# Patient Record
Sex: Female | Born: 1970
Health system: Southern US, Community
[De-identification: ages and names within clinical notes are randomized; demographics above are authoritative.]

## PROBLEM LIST (undated history)

## (undated) DIAGNOSIS — F329 Major depressive disorder, single episode, unspecified: Secondary | ICD-10-CM

## (undated) DIAGNOSIS — I10 Essential (primary) hypertension: Secondary | ICD-10-CM

## (undated) DIAGNOSIS — R51 Headache: Secondary | ICD-10-CM

## (undated) DIAGNOSIS — F410 Panic disorder [episodic paroxysmal anxiety] without agoraphobia: Secondary | ICD-10-CM

## (undated) DIAGNOSIS — F32A Depression, unspecified: Secondary | ICD-10-CM

## (undated) DIAGNOSIS — M1990 Unspecified osteoarthritis, unspecified site: Secondary | ICD-10-CM

## (undated) DIAGNOSIS — R569 Unspecified convulsions: Secondary | ICD-10-CM

## (undated) DIAGNOSIS — K469 Unspecified abdominal hernia without obstruction or gangrene: Secondary | ICD-10-CM

## (undated) DIAGNOSIS — K219 Gastro-esophageal reflux disease without esophagitis: Secondary | ICD-10-CM

## (undated) DIAGNOSIS — E785 Hyperlipidemia, unspecified: Secondary | ICD-10-CM

## (undated) DIAGNOSIS — G4730 Sleep apnea, unspecified: Secondary | ICD-10-CM

## (undated) DIAGNOSIS — F419 Anxiety disorder, unspecified: Secondary | ICD-10-CM

## (undated) HISTORY — PX: ABDOMINAL HYSTERECTOMY: SHX81

## (undated) HISTORY — DX: Hyperlipidemia, unspecified: E78.5

## (undated) HISTORY — PX: CORONARY ANGIOPLASTY: SHX604

## (undated) HISTORY — DX: Headache: R51

## (undated) HISTORY — PX: APPENDECTOMY: SHX54

## (undated) HISTORY — PX: OTHER SURGICAL HISTORY: SHX169

## (undated) HISTORY — DX: Gastro-esophageal reflux disease without esophagitis: K21.9

## (undated) HISTORY — PX: MOUTH SURGERY: SHX715

---

## 2000-11-03 ENCOUNTER — Emergency Department (HOSPITAL_COMMUNITY): Admission: EM | Admit: 2000-11-03 | Discharge: 2000-11-03 | Payer: Self-pay | Admitting: *Deleted

## 2000-11-22 ENCOUNTER — Ambulatory Visit (HOSPITAL_COMMUNITY): Admission: RE | Admit: 2000-11-22 | Discharge: 2000-11-22 | Payer: Self-pay | Admitting: Obstetrics and Gynecology

## 2000-11-22 ENCOUNTER — Encounter: Payer: Self-pay | Admitting: Obstetrics and Gynecology

## 2001-05-09 ENCOUNTER — Encounter: Payer: Self-pay | Admitting: Obstetrics and Gynecology

## 2001-05-09 ENCOUNTER — Ambulatory Visit (HOSPITAL_COMMUNITY): Admission: RE | Admit: 2001-05-09 | Discharge: 2001-05-09 | Payer: Self-pay | Admitting: Obstetrics and Gynecology

## 2001-08-25 ENCOUNTER — Emergency Department (HOSPITAL_COMMUNITY): Admission: EM | Admit: 2001-08-25 | Discharge: 2001-08-25 | Payer: Self-pay | Admitting: Emergency Medicine

## 2001-09-20 ENCOUNTER — Emergency Department (HOSPITAL_COMMUNITY): Admission: EM | Admit: 2001-09-20 | Discharge: 2001-09-20 | Payer: Self-pay | Admitting: Emergency Medicine

## 2002-03-04 ENCOUNTER — Observation Stay (HOSPITAL_COMMUNITY): Admission: RE | Admit: 2002-03-04 | Discharge: 2002-03-05 | Payer: Self-pay | Admitting: Obstetrics & Gynecology

## 2004-01-07 ENCOUNTER — Emergency Department (HOSPITAL_COMMUNITY): Admission: EM | Admit: 2004-01-07 | Discharge: 2004-01-07 | Payer: Self-pay | Admitting: Emergency Medicine

## 2004-08-01 ENCOUNTER — Ambulatory Visit (HOSPITAL_COMMUNITY): Admission: RE | Admit: 2004-08-01 | Discharge: 2004-08-01 | Payer: Self-pay | Admitting: Obstetrics & Gynecology

## 2004-12-07 ENCOUNTER — Emergency Department (HOSPITAL_COMMUNITY): Admission: EM | Admit: 2004-12-07 | Discharge: 2004-12-07 | Payer: Self-pay | Admitting: Emergency Medicine

## 2005-09-09 ENCOUNTER — Inpatient Hospital Stay (HOSPITAL_COMMUNITY): Admission: EM | Admit: 2005-09-09 | Discharge: 2005-09-11 | Payer: Self-pay | Admitting: *Deleted

## 2005-09-10 ENCOUNTER — Ambulatory Visit: Payer: Self-pay | Admitting: *Deleted

## 2005-11-01 ENCOUNTER — Ambulatory Visit (HOSPITAL_COMMUNITY): Payer: Self-pay | Admitting: Psychiatry

## 2005-11-02 ENCOUNTER — Ambulatory Visit (HOSPITAL_COMMUNITY): Payer: Self-pay | Admitting: Psychiatry

## 2005-11-08 ENCOUNTER — Ambulatory Visit (HOSPITAL_COMMUNITY): Payer: Self-pay | Admitting: Psychiatry

## 2005-11-21 ENCOUNTER — Ambulatory Visit (HOSPITAL_COMMUNITY): Payer: Self-pay | Admitting: Psychiatry

## 2005-11-29 ENCOUNTER — Ambulatory Visit (HOSPITAL_COMMUNITY): Payer: Self-pay | Admitting: Psychiatry

## 2006-01-17 ENCOUNTER — Ambulatory Visit (HOSPITAL_COMMUNITY): Payer: Self-pay | Admitting: Psychiatry

## 2006-02-14 ENCOUNTER — Ambulatory Visit (HOSPITAL_COMMUNITY): Payer: Self-pay | Admitting: Psychiatry

## 2006-04-09 ENCOUNTER — Ambulatory Visit (HOSPITAL_COMMUNITY): Payer: Self-pay | Admitting: Psychiatry

## 2006-04-24 ENCOUNTER — Emergency Department (HOSPITAL_COMMUNITY): Admission: EM | Admit: 2006-04-24 | Discharge: 2006-04-24 | Payer: Self-pay | Admitting: Emergency Medicine

## 2006-08-27 ENCOUNTER — Ambulatory Visit (HOSPITAL_COMMUNITY): Payer: Self-pay | Admitting: Psychiatry

## 2006-10-23 ENCOUNTER — Ambulatory Visit (HOSPITAL_COMMUNITY): Payer: Self-pay | Admitting: Psychiatry

## 2006-11-05 ENCOUNTER — Ambulatory Visit (HOSPITAL_COMMUNITY): Payer: Self-pay | Admitting: Psychiatry

## 2006-11-26 ENCOUNTER — Ambulatory Visit (HOSPITAL_COMMUNITY): Payer: Self-pay | Admitting: Psychiatry

## 2006-12-05 ENCOUNTER — Ambulatory Visit (HOSPITAL_COMMUNITY): Payer: Self-pay | Admitting: Psychiatry

## 2007-02-04 ENCOUNTER — Ambulatory Visit (HOSPITAL_COMMUNITY): Payer: Self-pay | Admitting: Psychiatry

## 2007-04-08 ENCOUNTER — Ambulatory Visit (HOSPITAL_COMMUNITY): Payer: Self-pay | Admitting: Psychiatry

## 2007-07-17 ENCOUNTER — Ambulatory Visit (HOSPITAL_COMMUNITY): Payer: Self-pay | Admitting: Psychiatry

## 2007-10-27 ENCOUNTER — Ambulatory Visit (HOSPITAL_COMMUNITY): Admission: RE | Admit: 2007-10-27 | Discharge: 2007-10-27 | Payer: Self-pay | Admitting: Internal Medicine

## 2007-11-06 ENCOUNTER — Ambulatory Visit (HOSPITAL_COMMUNITY): Payer: Self-pay | Admitting: Psychiatry

## 2007-12-19 ENCOUNTER — Ambulatory Visit (HOSPITAL_COMMUNITY): Admission: RE | Admit: 2007-12-19 | Discharge: 2007-12-19 | Payer: Self-pay | Admitting: General Surgery

## 2007-12-19 ENCOUNTER — Encounter (INDEPENDENT_AMBULATORY_CARE_PROVIDER_SITE_OTHER): Payer: Self-pay | Admitting: General Surgery

## 2008-06-17 ENCOUNTER — Emergency Department (HOSPITAL_COMMUNITY): Admission: EM | Admit: 2008-06-17 | Discharge: 2008-06-17 | Payer: Self-pay | Admitting: Emergency Medicine

## 2009-02-02 ENCOUNTER — Emergency Department (HOSPITAL_COMMUNITY): Admission: EM | Admit: 2009-02-02 | Discharge: 2009-02-02 | Payer: Self-pay | Admitting: Emergency Medicine

## 2009-10-12 ENCOUNTER — Emergency Department (HOSPITAL_COMMUNITY): Admission: EM | Admit: 2009-10-12 | Discharge: 2009-10-12 | Payer: Self-pay | Admitting: Emergency Medicine

## 2009-10-17 ENCOUNTER — Ambulatory Visit (HOSPITAL_COMMUNITY): Admission: RE | Admit: 2009-10-17 | Discharge: 2009-10-17 | Payer: Self-pay | Admitting: Urology

## 2009-11-01 ENCOUNTER — Ambulatory Visit (HOSPITAL_COMMUNITY): Admission: RE | Admit: 2009-11-01 | Discharge: 2009-11-01 | Payer: Self-pay | Admitting: Urology

## 2009-11-04 ENCOUNTER — Ambulatory Visit (HOSPITAL_COMMUNITY): Admission: RE | Admit: 2009-11-04 | Discharge: 2009-11-04 | Payer: Self-pay | Admitting: Urology

## 2009-11-04 ENCOUNTER — Emergency Department (HOSPITAL_COMMUNITY): Admission: EM | Admit: 2009-11-04 | Discharge: 2009-11-04 | Payer: Self-pay | Admitting: Emergency Medicine

## 2010-04-02 ENCOUNTER — Emergency Department (HOSPITAL_COMMUNITY)
Admission: EM | Admit: 2010-04-02 | Discharge: 2010-04-02 | Payer: Self-pay | Source: Home / Self Care | Admitting: Family Medicine

## 2010-05-14 ENCOUNTER — Encounter: Payer: Self-pay | Admitting: Family Medicine

## 2010-07-04 LAB — POCT URINALYSIS DIPSTICK
Bilirubin Urine: NEGATIVE
Nitrite: NEGATIVE
Urobilinogen, UA: 0.2 mg/dL (ref 0.0–1.0)

## 2010-07-09 LAB — CBC
HCT: 40.2 % (ref 36.0–46.0)
MCH: 32.1 pg (ref 26.0–34.0)
MCHC: 34.6 g/dL (ref 30.0–36.0)
MCV: 92.7 fL (ref 78.0–100.0)
Platelets: 267 10*3/uL (ref 150–400)
RBC: 4.34 MIL/uL (ref 3.87–5.11)
WBC: 8.6 10*3/uL (ref 4.0–10.5)

## 2010-07-09 LAB — URINALYSIS, ROUTINE W REFLEX MICROSCOPIC
Glucose, UA: NEGATIVE mg/dL
Ketones, ur: NEGATIVE mg/dL
Specific Gravity, Urine: 1.01 (ref 1.005–1.030)
pH: 6 (ref 5.0–8.0)

## 2010-07-09 LAB — URINE CULTURE

## 2010-07-09 LAB — URINE MICROSCOPIC-ADD ON

## 2010-07-09 LAB — DIFFERENTIAL
Eosinophils Relative: 5 % (ref 0–5)
Neutrophils Relative %: 46 % (ref 43–77)

## 2010-07-09 LAB — SURGICAL PCR SCREEN
MRSA, PCR: NEGATIVE
Staphylococcus aureus: NEGATIVE

## 2010-07-09 LAB — COMPREHENSIVE METABOLIC PANEL
AST: 17 U/L (ref 0–37)
Albumin: 4.4 g/dL (ref 3.5–5.2)
Alkaline Phosphatase: 69 U/L (ref 39–117)
Creatinine, Ser: 0.71 mg/dL (ref 0.4–1.2)
GFR calc Af Amer: >60 mL/min (ref >60)
Total Bilirubin: 0.4 mg/dL (ref 0.3–1.2)

## 2010-07-27 LAB — URINE CULTURE: Colony Count: NO GROWTH

## 2010-07-27 LAB — BASIC METABOLIC PANEL
BUN: 8 mg/dL (ref 6–23)
CO2: 25 mEq/L (ref 19–32)
Calcium: 8.9 mg/dL (ref 8.4–10.5)
Chloride: 107 mEq/L (ref 96–112)
Creatinine, Ser: 0.62 mg/dL (ref 0.4–1.2)
GFR calc Af Amer: >60 mL/min (ref >60)

## 2010-07-27 LAB — URINALYSIS, ROUTINE W REFLEX MICROSCOPIC
Bilirubin Urine: NEGATIVE
Glucose, UA: NEGATIVE mg/dL
Hgb urine dipstick: NEGATIVE
Urobilinogen, UA: 0.2 mg/dL (ref 0.0–1.0)

## 2010-07-27 LAB — DIFFERENTIAL
Basophils Absolute: 0 10*3/uL (ref 0.0–0.1)
Basophils Relative: 0 % (ref 0–1)
Eosinophils Absolute: 0.4 10*3/uL (ref 0.0–0.7)
Monocytes Relative: 7 % (ref 3–12)
Neutro Abs: 5.4 10*3/uL (ref 1.7–7.7)
Neutrophils Relative %: 63 % (ref 43–77)

## 2010-07-27 LAB — POCT CARDIAC MARKERS
CKMB, poc: <1 ng/mL — ABNORMAL LOW (ref 1.0–8.0)
Myoglobin, poc: 79.3 ng/mL (ref 12–200)

## 2010-07-27 LAB — CBC
MCHC: 34.9 g/dL (ref 30.0–36.0)
MCV: 93.2 fL (ref 78.0–100.0)
Platelets: 211 10*3/uL (ref 150–400)
RBC: 4.25 MIL/uL (ref 3.87–5.11)

## 2010-07-27 LAB — URINE MICROSCOPIC-ADD ON

## 2010-07-27 LAB — PREGNANCY, URINE: Preg Test, Ur: NEGATIVE

## 2010-08-08 LAB — URINALYSIS, ROUTINE W REFLEX MICROSCOPIC
Bilirubin Urine: NEGATIVE
Glucose, UA: NEGATIVE mg/dL
Nitrite: NEGATIVE
Specific Gravity, Urine: 1.015 (ref 1.005–1.030)
pH: 6 (ref 5.0–8.0)

## 2010-08-08 LAB — COMPREHENSIVE METABOLIC PANEL
ALT: 33 U/L (ref 0–35)
AST: 19 U/L (ref 0–37)
CO2: 25 mEq/L (ref 19–32)
Calcium: 9.2 mg/dL (ref 8.4–10.5)
Creatinine, Ser: 0.82 mg/dL (ref 0.4–1.2)
GFR calc Af Amer: >60 mL/min (ref >60)
GFR calc non Af Amer: >60 mL/min (ref >60)
Sodium: 135 mEq/L (ref 135–145)
Total Protein: 7.1 g/dL (ref 6.0–8.3)

## 2010-08-08 LAB — DIFFERENTIAL
Eosinophils Relative: 4 % (ref 0–5)
Lymphocytes Relative: 32 % (ref 12–46)
Lymphs Abs: 3.1 10*3/uL (ref 0.7–4.0)
Monocytes Relative: 6 % (ref 3–12)

## 2010-08-08 LAB — POCT CARDIAC MARKERS
CKMB, poc: <1 ng/mL — ABNORMAL LOW (ref 1.0–8.0)
Myoglobin, poc: 38.4 ng/mL (ref 12–200)
Troponin i, poc: <0.05 ng/mL (ref 0.00–0.09)

## 2010-08-08 LAB — CBC
MCHC: 34.5 g/dL (ref 30.0–36.0)
MCV: 91.8 fL (ref 78.0–100.0)
Platelets: 319 10*3/uL (ref 150–400)
RDW: 13.8 % (ref 11.5–15.5)

## 2010-08-08 LAB — LIPASE, BLOOD: Lipase: 27 U/L (ref 11–59)

## 2010-09-05 NOTE — H&P (Signed)
Hailey Owen, Hailey Owen                 ACCOUNT NO.:  [id number]   MEDICAL RECORD NO.:  [id number]          PATIENT TYPE:  AMB   LOCATION:  DAY                           FACILITY:  [facility]   PHYSICIAN:  Tilford Pillar, MD      DATE OF BIRTH:  May 31, 1970   DATE OF ADMISSION:  DATE OF DISCHARGE:  LH                              HISTORY & PHYSICAL   CHIEF COMPLAINT:  Right axillary cyst/boil.   HISTORY OF PRESENT ILLNESS:  The patient is a 40 year old female who  presented to my office with approximately 2-3 months history of right  axillary nodule.  This had several intermittent episodes of swelling  with associated pain and spontaneous drainage, has been treated with  antibiotics.  No significant change.  She did have a similar lesion in  the right hip in the past, which required excision for removal.  She has  had no recent fever or chills.  No recent erythema.  No recent episode.   PAST MEDICAL HISTORY:  1. Anxiety.  2. Hypertension.   PAST SURGICAL HISTORY:  1. Hysterectomy.  2. Incision and drainage of right hip abscess/cyst.   MEDICATIONS:  Ativan, Robaxin, and Topamax.   ALLERGIES:  SEPTRA, the patient states shuts down her immune system.   SOCIAL HISTORY:  One pack per day tobacco smoker.  Alcohol occasional.  No recreational drug use.  Occupation is in [employer].   PERTINENT FAMILY HISTORY:  1. Coronary artery disease.  2. Hypertension.  3. Diabetes mellitus.   REVIEW OF SYSTEMS:  Relatively unremarkable on all systems.  CONSTITUTIONAL:  Unremarkable.  EYES:  Unremarkable.  EARS, NOSE, and  THROAT:  Unremarkable.  RESPIRATORY:  Unremarkable.  CARDIOVASCULAR:  Unremarkable.  GASTROINTESTINAL:  Unremarkable.  GENITOURINARY:  Unremarkable.  MUSCULOSKELETAL:  Unremarkable.  SKIN:  As per HPI,  otherwise unremarkable.  ENDOCRINE:  Unremarkable.  NEURO:  Unremarkable.   PHYSICAL EXAMINATION:  GENERAL:  On physical exam, in general the  patient is an obese,  calm-appearing female in no acute distress.  She is  alert and oriented x3.  HEENT:  Scalp no deformities, no masses.  Eyes:  Pupils equal, round,  and reactive.  Extraocular movements are intact.  No conjunctival pallor  is noted.  Oral mucosa pink.  Normal occlusion.  NECK:  Trachea is midline.  No cervical lymphadenopathy.  PULMONARY:  Unlabored respirations.  She is clear to auscultation  bilaterally.  CARDIOVASCULAR:  Regular rate and rhythm, 2+ radial pulses and dorsalis  pedis pulses bilaterally.  Assessment of the chest, she does have  axillary nodule in the right axilla, this is approximately 1 cm with  mobile, nontender.  No erythema, no fluctuance, no drainage, no  discharge.  SKIN:  Otherwise warm and dry.   ASSESSMENT/PLAN:  Sebaceous cyst of the right axilla.  At this point, I  did have a discussion with the patient risks, benefits, and alternatives  of cyst excision versus continued conservative management.  At this  point, the patient does wish to proceed with excision of the cyst.  The  risk including, but not  limited to the risk of bleeding, infection, and  recurrence were discussed with the patient.  The patient's questions and  concerns were addressed and the patient will be consented for the  planned procedure, which will be scheduled at the patient's earliest  convenience.      Tilford Pillar, MD  Electronically Signed     BZ/MEDQ  D:  12/16/2007  T:  12/17/2007  Job:  [id number]   cc:   Edsel Petrin, D.O.  Fax: [phone]   [facility]

## 2010-09-05 NOTE — Op Note (Signed)
Hailey Owen, Hailey Owen                 ACCOUNT NO.:  [id number]   MEDICAL RECORD NO.:  [id number]          PATIENT TYPE:  AMB   LOCATION:  DAY                           FACILITY:  [facility]   PHYSICIAN:  Tilford Pillar, MD      DATE OF BIRTH:  [birth year]   DATE OF PROCEDURE:  12/19/2007  DATE OF DISCHARGE:                               OPERATIVE REPORT   PREOPERATIVE DIAGNOSIS:  Right axillary sebaceous cyst.   POSTOPERATIVE DIAGNOSIS:  Right axillary sebaceous cyst.   PROCEDURE:  Excision of an axillary sebaceous cyst via 1.5-cm incision.   SURGEON:  Tilford Pillar, MD.   ANESTHESIA:  General endotracheal local anesthetic 0.5% Sensorcaine  plain.   SPECIMEN:  Sebaceous cyst.   ESTIMATED BLOOD LOSS:  Minimal.   INDICATIONS:  The patient is a 40 year old female who presented to my  office with a history of a nodule on her right axilla.  This has been  treated on several occasions with antibiotics with no resolution.  It  had increased in size and diminished over several different episodes.  On evaluation, it was clear that she had a cyst at the length of right  axilla.  The risks, benefits, and alternatives of which were discussed  at length with the patient including but not limited to risk of  bleeding, infection, and recurrence.  The patient's questions and  concerns were addressed, and the patient was consented for the planned  procedure.   OPERATION:  The patient was taken to the operating room and was placed  in the supine position on the operating table, at which time the general  anesthetic was administered.  When the patient was asleep, she was  endotracheally intubated by anesthesia.  Her right axilla was then  prepped with DuraPrep solution.  All dressings were placed in standard  fashion.  Local anesthetic was instilled and elliptical incision was  created around the cyst.  Continued dissection  down through the  subcuticular tissue was carried out using electrocautery.   The cyst was  removed in total.  It was placed on the back table and submitted as  permanent specimen to pathology.  Hemostasis was obtained using  electrocautery.  The wound was irrigated with sterile saline and then a  3-0 Vicryl was utilized to reapproximate the deep fifth subcuticular  tissue and a 4-0 Monocryl was utilized to reapproximate the skin edges  in a running subcuticular suture.  The skin was washed and dried with  moist dry towel.  Benzoin was applied around  incision.  Half-inch Steri-Strips were placed.  The patient was allowed  to come out of general anesthetic.  The patient was transferred to the  Postanesthetic Care Unit in stable condition.  At the conclusion of the  procedure, all instrument, sponge, and needle counts were correct.  The  patient tolerated the procedure well.      Tilford Pillar, MD  Electronically Signed     BZ/MEDQ  D:  12/19/2007  T:  12/20/2007  Job:  [id number]   cc:   Corrie Mckusick, M.D.  Fax:  [phone] 

## 2010-09-08 NOTE — Op Note (Signed)
NAME:  Hailey Owen, Hailey Owen                           ACCOUNT NO.:  [id number]   MEDICAL RECORD NO.:  [id number]                   PATIENT TYPE:  AMB   LOCATION:  DAY                                  FACILITY:  [facility]   PHYSICIAN:  Lazaro Arms, M.D.                DATE OF BIRTH:  1971-04-01   DATE OF PROCEDURE:  03/04/2002  DATE OF DISCHARGE:                                 OPERATIVE REPORT   PREOPERATIVE DIAGNOSES:  1. Right lower quadrant pain.  2. History of endometriosis.  3. Dyspareunia.   POSTOPERATIVE DIAGNOSES:  1. Right lower quadrant pain.  2. History of endometriosis.  3. Dyspareunia.  4. Severe pelvic abdominal adhesive disease.  5. Densely adherent ovaries bilaterally to pelvic sidewalls. Left ovary     adherent to the sigmoid colon.   PROCEDURE:  1. Operative laparoscopy with bilateral salpingo-oophorectomy.  2. Operative laparoscopy with extensive lysis of adhesions.   SURGEON:  Lazaro Arms, M.D.   ANESTHESIA:  General endotracheal.   FINDINGS:  The patient's omentum was densely adherent to the anterior  abdominal wall. It was not just adherent in one line, it was basically  draped across the anterior abdominal wall on both the left and right pelvis.  It divided the abdominal cavity into basically three vertical sections. Her  small bowel was also adherent in the omentum. Her sigmoid colon was densely  adherent to the left ovary and tube. The right ovary and tube was densely  adherent to the pelvic sidewall.   DESCRIPTION OF PROCEDURE:  The patient was taken to the operating room,  placed in the supine position where she underwent general endotracheal  anesthesia. She was placed in dorsal lithotomy position where she was  prepped and draped in the usual sterile fashion. A Foley catheter was  placed. An incision was made in the umbilicus and a 10/11 nonbladed  obturator was placed under direct visualization into the peritoneal cavity  without difficulty. The  peritoneal cavity was insufflated and viewed with  the video laparoscope. The above noted adhesions were found and they went  all the way up to the umbilicus. The only place I could get a trocar was in  the right lower quadrant and I placed a 5 mm trocar there, I placed a 22  gauge spinal needle first and there was no bleeding. I then made an incision  and placed a bladed 5 mm trocar under direct visualization without  difficulty. I then used the harmonic scalpel and pain stakingly took down  the adhesions of the omentum and areas of small bowel as well close to the  right ovary. I took them down from the abdominal wall using the harmonic  scalpel. I could not get any other trocar in so I could not provide any  counter traction so this was very tedious and slow. I was very careful not  to  injure the area of small bowel that was adherent to the omentum or the  right pelvic sidewall. I was then able to find the right ovary and tube and  used a 22 gauge spinal needle and placed it a fingerbreadth above the pubis  and was then able to put a 5 mm trocar there as well under direct  visualization. I then grasped the ovary and tube and used the harmonic  scalpel from the right lower quadrant to take down the infundibulopelvic  ligament and dissect it off the sidewall. This was done with a great deal of  medial tension in order to prevent any sidewall  damage. I then dropped the  ovary in the cul-de-sac and tube. I then had to do the same thing on the  left. I placed a 5 mm trocar in the left lower lobe and I first placed a  spinal needle, got no bleeding and then made an incision and placed a 5 mm  trocar oblated without difficulty. I used the harmonic scalpel and pain  stakingly took down omental adhesions again all the way up to the umbilicus.  She then had omentum and sigmoid colon adherent to the pelvic sidewall on  the left and it was sitting on top of the left ovary and tube. I had to   dissect the adhesions down before I could elevate the ovary and tube and get  to the blood supply. She also had adhesions of the omentum down to the  pelvis, to the bladder and the left pelvic sidewall and these were taken  down, again careful to avoid any injury to the bowel. The harmonic scalpel  was used and moved very quickly on full power to avoid any thermal damage. I  then was able to grasp the ovary and tube and excise them using the harmonic  scalpel again with good hemostasis. I tried to remove the ovaries and tubes  through the EndoCatch bag but was unable to so I used a morcellator. I put  the morcellator in the left lower quadrant and removed the ovaries and tubes  in that manner without difficulty. All pedicles were hemostatic, all  adhesions had been taken down, the pelvis was irrigated vigorously. The  trocars were then removed under direct visualization and there was no  bleeding and the gas was allowed to escape from the abdomen. The left lower  quadrant fascia was then closed with two #0 Vicryl sutures with good repair  of defect. The umbilical incision was then closed with a single #0 Vicryl  suture with good closure of defect. All skin was closed using skin staples.  All incision sites were injected with 0.5% Marcaine plain for local  anesthetic. The patient was awakened from anesthesia, taken to the recovery  room where she experienced 50-100 cc of blood loss and was taken to the  recovery room in good stable condition.                                               Lazaro Arms, M.D.    Loraine Maple  D:  03/04/2002  T:  03/04/2002  Job:  [id number]

## 2010-09-08 NOTE — H&P (Signed)
Hailey Owen, Hailey Owen                 ACCOUNT NO.:  [id number]   MEDICAL RECORD NO.:  [id number]          PATIENT TYPE:  IPS   LOCATION:  0508                          FACILITY:  [facility]   PHYSICIAN:  Jasmine Pang, M.D. DATE OF BIRTH:  09-Apr-1971   DATE OF ADMISSION:  09/09/2005  DATE OF DISCHARGE:                         PSYCHIATRIC ADMISSION ASSESSMENT   IDENTIFYING INFORMATION:  The patient is a 40 year old separated white  female.  Apparently, she is depressed with suicidal ideation and a plan to  drive her car off the road.  She experienced an anxiety attack earlier in  the day.  Apparently, she has been separated for several weeks now from her  husband.  She lost her employment because she was working for him.  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  She does have employment coming up Sep 18, 2005.  She is  to be oriented to the dietary department at [facility].  She  stated that, Friday night, she had indicated to her husband that she really  does want to divorce him, she really does want to completely be separate and  he kept calling her back on her cell phone as she was going to her  girlfriend's house.  As she was driving to her girlfriend's house, she had  this idea that the only way to solve all of this conflict was to drive her  car off the road.  She got to the girlfriend's house, spent the night.  The  next morning, her husband came over to the girlfriend's house, talked [place]  [place] and the girlfriend followed them [place] to the lake and  she went over to visit with the girlfriend, started crying uncontrollably  and went to [facility].  During the time that she was crying, her  chest began to hurt.  Her girlfriend gave her one of her nerve pills which  is why her urine drug screen was positive for benzodiazepines, although she  is in fact prescribed Ativan herself.  In the  emergency department, her UDS  was positive for benzodiazepines.  No alcohol on board and the other labs  were within normal limits.   PAST PSYCHIATRIC HISTORY:  She has no inpatient history.  She has received  antidepressants for the past 2-3 years from Dr. __________, her primary care  Aysel Gilchrest.   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  She has been  married once.  She has two stepdaughters, ages 26 and 47.  She is not  employed at present because she had been working for her husband although  she is due to start a job on Sep 18, 2005.   FAMILY HISTORY:  She states she has one sister who has bad depression and  takes multiple medications.  Her father was an alcoholic.  He died in  04-05-05.   ALCOHOL/DRUG HISTORY:  The patient smokes a pack a day.   MEDICATIONS:  Currently prescribed medications include Wellbutrin 75 mg p.o.  q.d., Prozac 40 mg p.o. q.d., Ativan 0.5 mg, 1 p.r.n., Premarin 1.25 mg p.o.  q.d.  She might have been prescribed Chantix to help her stop smoking but it  is not clear.   ALLERGIES:  MORPHINE, DARVOCET.   PHYSICAL EXAMINATION:  This is a well-developed, obese white female in no  acute distress at this time.  Her vital signs, on admission, shows she is 67  inches tall, she weighs 220 pounds.  Her temperature is 97.5, blood pressure  159/80 to 145/86, pulse 70-72 and respirations are 16.  She does have a  history for developing boils in her pubic area that is treated with  clindamycin p.r.n.  She also gives a history for having genital herpes  because her husband was cheating on her.  Other than that, her physical  examination is unremarkable and is well-documented from the ER visit at  [facility].   MENTAL STATUS EXAM:  She is alert and oriented x3.  She is casually groomed,  dressed and adequately nourished.  Her speech is not pressured.  Her mood is  depressed and anxious.  Her affect congruent.  Her thought processes are   clear, organized and goal-oriented.  She wants to totally leave her husband  and have him leave her alone.  Judgment and insight are intact.  Concentration and memory are intact.  Intelligence is average.  She is not  actively suicidal.  However, she does not see an end to her situation.  She  wants help with that and she has never had auditory or visual  hallucinations.  She is not homicidal.   DIAGNOSES:  AXIS I:  Depressive disorder not otherwise specified,  situationally-induced.  AXIS II:  Deferred.  AXIS III:  Obesity, status post hysterectomy for endometriosis.  AXIS IV:  Separating and divorcing, problems with primary support group,  occupational issues, economic issues.  AXIS V:  30.   PLAN:  To admit for stabilization, to adjust her medications as indicated  and to have a family session with her husband to help her finalize the  separation and set boundaries.      Mickie Leonarda Salon, P.A.-C.      Jasmine Pang, M.D.  Electronically Signed    MD/MEDQ  D:  09/09/2005  T:  09/09/2005  Job:  [id number]
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
## 2010-09-30 ENCOUNTER — Emergency Department (HOSPITAL_COMMUNITY)
Admission: EM | Admit: 2010-09-30 | Discharge: 2010-09-30 | Disposition: A | Discharge status: Home / Self Care | Payer: 59 | Attending: Emergency Medicine | Admitting: Emergency Medicine

## 2010-09-30 DIAGNOSIS — R51 Headache: Secondary | ICD-10-CM | POA: Insufficient documentation

## 2010-09-30 DIAGNOSIS — K219 Gastro-esophageal reflux disease without esophagitis: Secondary | ICD-10-CM | POA: Insufficient documentation

## 2010-09-30 DIAGNOSIS — Z87442 Personal history of urinary calculi: Secondary | ICD-10-CM | POA: Insufficient documentation

## 2010-10-01 LAB — URINALYSIS, ROUTINE W REFLEX MICROSCOPIC
Glucose, UA: NEGATIVE mg/dL
Hgb urine dipstick: NEGATIVE
Ketones, ur: NEGATIVE mg/dL
Protein, ur: NEGATIVE mg/dL

## 2010-10-01 LAB — COMPREHENSIVE METABOLIC PANEL
AST: 13 U/L (ref 0–37)
CO2: 26 mEq/L (ref 19–32)
Calcium: 9.5 mg/dL (ref 8.4–10.5)
Creatinine, Ser: 0.68 mg/dL (ref 0.4–1.2)
GFR calc non Af Amer: >60 mL/min (ref >60)

## 2010-10-01 LAB — CBC
MCH: 31.1 pg (ref 26.0–34.0)
MCHC: 34.2 g/dL (ref 30.0–36.0)
MCV: 91 fL (ref 78.0–100.0)
Platelets: 266 10*3/uL (ref 150–400)
RDW: 13.5 % (ref 11.5–15.5)

## 2010-10-01 LAB — DIFFERENTIAL
Eosinophils Absolute: 0.4 10*3/uL (ref 0.0–0.7)
Eosinophils Relative: 6 % — ABNORMAL HIGH (ref 0–5)
Lymphs Abs: 2.8 10*3/uL (ref 0.7–4.0)
Monocytes Absolute: 0.5 10*3/uL (ref 0.1–1.0)
Monocytes Relative: 7 % (ref 3–12)

## 2010-11-24 ENCOUNTER — Other Ambulatory Visit (HOSPITAL_COMMUNITY): Payer: Self-pay | Admitting: Internal Medicine

## 2010-11-24 DIAGNOSIS — Z139 Encounter for screening, unspecified: Secondary | ICD-10-CM

## 2010-11-27 ENCOUNTER — Ambulatory Visit (HOSPITAL_COMMUNITY): Payer: 59

## 2011-04-15 ENCOUNTER — Emergency Department (HOSPITAL_COMMUNITY)
Admission: EM | Admit: 2011-04-15 | Discharge: 2011-04-15 | Disposition: A | Discharge status: Home / Self Care | Payer: 59 | Source: Home / Self Care | Attending: Family Medicine | Admitting: Family Medicine

## 2011-04-15 DIAGNOSIS — K219 Gastro-esophageal reflux disease without esophagitis: Secondary | ICD-10-CM

## 2011-04-15 HISTORY — DX: Anxiety disorder, unspecified: F41.9

## 2011-04-15 MED ORDER — PANTOPRAZOLE SODIUM 20 MG PO TBEC: 40.0000 mg | DELAYED_RELEASE_TABLET | Freq: Every day | ORAL | Status: DC

## 2011-04-15 MED ORDER — GI COCKTAIL ~~LOC~~
ORAL | Status: AC
  Filled 2011-04-15: qty 30

## 2011-04-15 MED ORDER — ONDANSETRON HCL 4 MG PO TABS: 4.0000 mg | ORAL_TABLET | Freq: Four times a day (QID) | ORAL | Status: AC

## 2011-04-15 MED ORDER — ONDANSETRON 4 MG PO TBDP
4.0000 mg | ORAL_TABLET | Freq: Once | ORAL | Status: AC
  Administered 2011-04-15: 4 mg via ORAL

## 2011-04-15 MED ORDER — GI COCKTAIL ~~LOC~~
30.0000 mL | Freq: Once | ORAL | Status: AC
  Administered 2011-04-15: 30 mL via ORAL

## 2011-04-15 MED ORDER — ONDANSETRON 4 MG PO TBDP
ORAL_TABLET | ORAL | Status: AC
  Filled 2011-04-15: qty 1

## 2011-04-15 NOTE — ED Notes (Signed)
Pt has nausea, abdominal cramping and burning and no appetite for three weeks.  She has hx of acid reflux, but never this severe.

## 2011-04-15 NOTE — ED Provider Notes (Addendum)
History     CSN: [id number]  Arrival date & time 04/15/11  1212   First MD Initiated Contact with Patient 04/15/11 1251      Chief Complaint  Patient presents with  . GI Problem    (Consider location/radiation/quality/duration/timing/severity/associated sxs/prior treatment) Patient is a 40 y.o. female presenting with abdominal pain. The history is provided by the patient.  Abdominal Pain The primary symptoms of the illness include abdominal pain, nausea and vomiting. The current episode started more than 2 days ago (issues for 3 weeks). The onset of the illness was gradual. The problem has not changed since onset. Associated with: smoker, sx worse with eating, works 2 jobs. The patient states that she believes she is currently not pregnant. The patient has not had a change in bowel habit. Additional symptoms associated with the illness include heartburn. Symptoms associated with the illness do not include chills, anorexia, constipation or back pain. Significant associated medical issues include GERD.    No past medical history on file.  No past surgical history on file.  No family history on file.  History  Substance Use Topics  . Smoking status: Not on file  . Smokeless tobacco: Not on file  . Alcohol Use: Not on file    OB History    No data available      Review of Systems  Constitutional: Negative.  Negative for chills.  HENT: Negative.   Respiratory: Negative.   Cardiovascular: Negative.   Gastrointestinal: Positive for heartburn, nausea, vomiting and abdominal pain. Negative for constipation, abdominal distention and anorexia.  Genitourinary: Negative.   Musculoskeletal: Negative for back pain.    Allergies  Review of patient's allergies indicates not on file.  Home Medications   Current Outpatient Rx  Name Route Sig Dispense Refill  . PANTOPRAZOLE SODIUM 20 MG PO TBEC Oral Take 2 tablets (40 mg total) by mouth daily. 60 tablet 1    BP 132/58  Pulse  71  Temp(Src) 98.1 F (36.7 C) (Oral)  Resp 16  SpO2 100%  Physical Exam  Nursing note and vitals reviewed. Constitutional: She appears well-developed and well-nourished.  HENT:  Head: Normocephalic.  Mouth/Throat: Oropharynx is clear and moist.  Neck: Normal range of motion. Neck supple.  Abdominal: Soft. Normal appearance and bowel sounds are normal. She exhibits no distension, no abdominal bruit, no pulsatile midline mass and no mass. There is no hepatosplenomegaly. There is tenderness in the epigastric area. There is no rigidity, no rebound, no guarding and no CVA tenderness. No hernia.  Lymphadenopathy:    She has no cervical adenopathy.    ED Course  Procedures (including critical care time)  Labs Reviewed - No data to display No results found.   1. GERD (gastroesophageal reflux disease)       MDM          Barkley Bruns, MD 04/15/11 1338  Barkley Bruns, MD 04/15/11 [phone]

## 2011-04-29 ENCOUNTER — Encounter (HOSPITAL_COMMUNITY): Payer: Self-pay | Admitting: Emergency Medicine

## 2011-04-29 ENCOUNTER — Emergency Department (HOSPITAL_COMMUNITY)
Admission: EM | Admit: 2011-04-29 | Discharge: 2011-04-29 | Disposition: A | Discharge status: Home / Self Care | Payer: 59 | Source: Home / Self Care | Attending: Emergency Medicine | Admitting: Emergency Medicine

## 2011-04-29 DIAGNOSIS — H6691 Otitis media, unspecified, right ear: Secondary | ICD-10-CM

## 2011-04-29 DIAGNOSIS — H6690 Otitis media, unspecified, unspecified ear: Secondary | ICD-10-CM

## 2011-04-29 HISTORY — DX: Unspecified abdominal hernia without obstruction or gangrene: K46.9

## 2011-04-29 MED ORDER — TRAMADOL HCL 50 MG PO TABS: 100.0000 mg | ORAL_TABLET | Freq: Three times a day (TID) | ORAL | Status: AC | PRN

## 2011-04-29 MED ORDER — AMOXICILLIN-POT CLAVULANATE 875-125 MG PO TABS: 1.0000 | ORAL_TABLET | Freq: Two times a day (BID) | ORAL | Status: AC

## 2011-04-29 NOTE — ED Provider Notes (Signed)
Chief Complaint  Patient presents with  . Otalgia  . 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 She has been exposed to strep.  Review of Systems:  Other than noted above, the patient denies any of the following symptoms. Systemic:  No fever, chills, sweats, fatigue, myalgias, headache, or anorexia. Eye:  No redness, pain or drainage. ENT:  No earache, nasal congestion, rhinorrhea, sinus pressure, or sore throat. Lungs:  No cough, sputum production, wheezing, shortness of breath. Or chest pain. GI:  No nausea, vomiting, abdominal pain or diarrhea. Skin:  No rash or itching.  PMFSH:  Past medical history, family history, social history, meds, and allergies were reviewed.  Physical Exam:   Vital signs:  BP 123/65  Pulse 77  Temp(Src) 98.5 F (36.9 C) (Oral)  Resp 16  SpO2 97% General:  Alert, in no distress. Eye:  No conjunctival injection or drainage. ENT: Her left TM is normal. The right TM was red, dull, and retracted.  Nasal mucosa was clear and uncongested, without drainage.  Mucous membranes were moist.  Pharynx was clear, without exudate or drainage.  There were no oral ulcerations or lesions. Neck:  Supple, no adenopathy, tenderness or mass. Lungs:  No respiratory distress.  Lungs were clear to auscultation, without wheezes, rales or rhonchi.  Breath sounds were clear and equal bilaterally. Heart:  Regular rhythm, without gallops, murmers or rubs. Skin:  Clear, warm, and dry, without rash or lesions.  Labs:   Results for orders placed during the hospital encounter of 04/29/11  POCT RAPID STREP A ([facility]RG CARE ONLY)      Component Value Range   Streptococcus, Group A Screen (Direct) NEGATIVE  NEGATIVE      Radiology:  No results found.  Medications given in UCC:   None  Assessment:   Diagnoses that have been ruled out:  Diagnoses that are still under consideration:  Final diagnoses:  Right otitis media     Plan:   1.  The following meds were prescribed:   New Prescriptions   AMOXICILLIN-CLAVULANATE (AUGMENTIN) 875-125 MG PER TABLET    Take 1 tablet by mouth 2 (two) times daily.   TRAMADOL (ULTRAM) 50 MG TABLET    Take 2 tablets (100 mg total) by mouth every 8 (eight) hours as needed for pain.   2.  The patient was instructed in symptomatic care and handouts were given. 3.  The patient was told to return if becoming worse in any way, if no better in 3 or 4 days, and given some red flag symptoms that would indicate earlier return.   Roque Lias, MD 04/29/11 [phone]

## 2011-04-29 NOTE — ED Notes (Signed)
Pt here with sore throat with radiating pain to r ear and neck that started yesterday.pain with swallowing.pt ahs hx ear infections.no fevers with sx.

## 2011-07-17 ENCOUNTER — Encounter (HOSPITAL_COMMUNITY): Payer: Self-pay | Admitting: Psychiatry

## 2011-07-17 ENCOUNTER — Ambulatory Visit (INDEPENDENT_AMBULATORY_CARE_PROVIDER_SITE_OTHER): Payer: 59 | Admitting: Psychiatry

## 2011-07-17 VITALS — Wt 207.0 lb

## 2011-07-17 DIAGNOSIS — F39 Unspecified mood [affective] disorder: Secondary | ICD-10-CM

## 2011-07-17 MED ORDER — CITALOPRAM HYDROBROMIDE 20 MG PO TABS: 20.0000 mg | ORAL_TABLET | Freq: Every day | ORAL | Status: DC

## 2011-07-17 MED ORDER — DIVALPROEX SODIUM ER 250 MG PO TB24: 250.0000 mg | ORAL_TABLET | Freq: Every day | ORAL | Status: DC
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
## 2011-08-29 ENCOUNTER — Other Ambulatory Visit (HOSPITAL_COMMUNITY): Payer: Self-pay | Admitting: Psychiatry

## 2011-08-29 DIAGNOSIS — F39 Unspecified mood [affective] disorder: Secondary | ICD-10-CM

## 2011-08-30 ENCOUNTER — Telehealth (HOSPITAL_COMMUNITY): Payer: Self-pay | Admitting: *Deleted

## 2011-09-04 ENCOUNTER — Encounter (HOSPITAL_COMMUNITY): Payer: Self-pay | Admitting: Psychiatry

## 2011-09-04 ENCOUNTER — Ambulatory Visit (INDEPENDENT_AMBULATORY_CARE_PROVIDER_SITE_OTHER): Payer: 59 | Admitting: Psychiatry

## 2011-09-04 VITALS — Wt 206.0 lb

## 2011-09-04 DIAGNOSIS — F39 Unspecified mood [affective] disorder: Secondary | ICD-10-CM

## 2011-09-04 DIAGNOSIS — F329 Major depressive disorder, single episode, unspecified: Secondary | ICD-10-CM

## 2011-09-04 DIAGNOSIS — G4700 Insomnia, unspecified: Secondary | ICD-10-CM

## 2011-09-04 MED ORDER — CITALOPRAM HYDROBROMIDE 20 MG PO TABS: 20.0000 mg | ORAL_TABLET | Freq: Every day | ORAL | Status: DC

## 2011-09-04 MED ORDER — RAMELTEON 8 MG PO TABS: 8.0000 mg | ORAL_TABLET | Freq: Every day | ORAL | Status: DC
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
## 2011-09-18 ENCOUNTER — Ambulatory Visit (HOSPITAL_COMMUNITY)
Admission: RE | Admit: 2011-09-18 | Discharge: 2011-09-18 | Disposition: A | Discharge status: Home / Self Care | Payer: 59 | Source: Ambulatory Visit | Attending: Internal Medicine | Admitting: Internal Medicine

## 2011-09-18 DIAGNOSIS — Z1231 Encounter for screening mammogram for malignant neoplasm of breast: Secondary | ICD-10-CM | POA: Insufficient documentation

## 2011-09-18 DIAGNOSIS — Z139 Encounter for screening, unspecified: Secondary | ICD-10-CM

## 2011-10-02 ENCOUNTER — Other Ambulatory Visit (HOSPITAL_COMMUNITY): Payer: Self-pay | Admitting: Internal Medicine

## 2011-10-02 DIAGNOSIS — R51 Headache: Secondary | ICD-10-CM

## 2011-10-04 ENCOUNTER — Ambulatory Visit (HOSPITAL_COMMUNITY)
Admission: RE | Admit: 2011-10-04 | Discharge: 2011-10-04 | Disposition: A | Discharge status: Home / Self Care | Payer: 59 | Source: Ambulatory Visit | Attending: Internal Medicine | Admitting: Internal Medicine

## 2011-10-04 ENCOUNTER — Ambulatory Visit (HOSPITAL_COMMUNITY): Payer: 59

## 2011-10-04 DIAGNOSIS — R51 Headache: Secondary | ICD-10-CM

## 2011-10-05 ENCOUNTER — Ambulatory Visit (HOSPITAL_COMMUNITY)
Admission: RE | Admit: 2011-10-05 | Discharge: 2011-10-05 | Disposition: A | Discharge status: Home / Self Care | Payer: 59 | Source: Ambulatory Visit | Attending: Internal Medicine | Admitting: Internal Medicine

## 2011-10-05 DIAGNOSIS — R42 Dizziness and giddiness: Secondary | ICD-10-CM | POA: Insufficient documentation

## 2011-10-05 DIAGNOSIS — R51 Headache: Secondary | ICD-10-CM | POA: Insufficient documentation

## 2011-10-16 ENCOUNTER — Ambulatory Visit (INDEPENDENT_AMBULATORY_CARE_PROVIDER_SITE_OTHER): Payer: 59 | Admitting: Psychiatry

## 2011-10-16 ENCOUNTER — Encounter (HOSPITAL_COMMUNITY): Payer: Self-pay | Admitting: Psychiatry

## 2011-10-16 VITALS — Wt 208.0 lb

## 2011-10-16 DIAGNOSIS — F329 Major depressive disorder, single episode, unspecified: Secondary | ICD-10-CM

## 2011-10-16 DIAGNOSIS — F39 Unspecified mood [affective] disorder: Secondary | ICD-10-CM

## 2011-10-16 MED ORDER — CITALOPRAM HYDROBROMIDE 20 MG PO TABS: 20.0000 mg | ORAL_TABLET | Freq: Every day | ORAL | Status: DC
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
## 2011-12-31 ENCOUNTER — Other Ambulatory Visit (HOSPITAL_COMMUNITY): Payer: Self-pay | Admitting: Psychiatry

## 2012-01-02 ENCOUNTER — Other Ambulatory Visit (HOSPITAL_COMMUNITY): Payer: Self-pay | Admitting: *Deleted

## 2012-01-02 ENCOUNTER — Other Ambulatory Visit (HOSPITAL_COMMUNITY): Payer: Self-pay | Admitting: Psychiatry

## 2012-01-02 DIAGNOSIS — F39 Unspecified mood [affective] disorder: Secondary | ICD-10-CM

## 2012-01-02 MED ORDER — CITALOPRAM HYDROBROMIDE 20 MG PO TABS: 20.0000 mg | ORAL_TABLET | Freq: Every day | ORAL | Status: DC

## 2012-01-10 ENCOUNTER — Ambulatory Visit (INDEPENDENT_AMBULATORY_CARE_PROVIDER_SITE_OTHER): Payer: 59 | Admitting: Psychiatry

## 2012-01-10 ENCOUNTER — Encounter (HOSPITAL_COMMUNITY): Payer: Self-pay | Admitting: Psychiatry

## 2012-01-10 DIAGNOSIS — F329 Major depressive disorder, single episode, unspecified: Secondary | ICD-10-CM

## 2012-01-10 DIAGNOSIS — F39 Unspecified mood [affective] disorder: Secondary | ICD-10-CM

## 2012-01-10 MED ORDER — CITALOPRAM HYDROBROMIDE 20 MG PO TABS: 20.0000 mg | ORAL_TABLET | Freq: Every day | ORAL | Status: DC
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
## 2012-02-04 ENCOUNTER — Encounter (HOSPITAL_COMMUNITY): Payer: Self-pay

## 2012-02-04 ENCOUNTER — Emergency Department (HOSPITAL_COMMUNITY)
Admission: EM | Admit: 2012-02-04 | Discharge: 2012-02-04 | Disposition: A | Discharge status: Home / Self Care | Payer: 59 | Attending: Emergency Medicine | Admitting: Emergency Medicine

## 2012-02-04 DIAGNOSIS — F419 Anxiety disorder, unspecified: Secondary | ICD-10-CM

## 2012-02-04 DIAGNOSIS — F17200 Nicotine dependence, unspecified, uncomplicated: Secondary | ICD-10-CM | POA: Insufficient documentation

## 2012-02-04 DIAGNOSIS — F329 Major depressive disorder, single episode, unspecified: Secondary | ICD-10-CM | POA: Insufficient documentation

## 2012-02-04 DIAGNOSIS — E785 Hyperlipidemia, unspecified: Secondary | ICD-10-CM | POA: Insufficient documentation

## 2012-02-04 DIAGNOSIS — F410 Panic disorder [episodic paroxysmal anxiety] without agoraphobia: Secondary | ICD-10-CM | POA: Insufficient documentation

## 2012-02-04 DIAGNOSIS — F411 Generalized anxiety disorder: Secondary | ICD-10-CM | POA: Insufficient documentation

## 2012-02-04 DIAGNOSIS — F3289 Other specified depressive episodes: Secondary | ICD-10-CM | POA: Insufficient documentation

## 2012-02-04 DIAGNOSIS — K219 Gastro-esophageal reflux disease without esophagitis: Secondary | ICD-10-CM | POA: Insufficient documentation

## 2012-02-04 DIAGNOSIS — Z79899 Other long term (current) drug therapy: Secondary | ICD-10-CM | POA: Insufficient documentation

## 2012-02-04 HISTORY — DX: Major depressive disorder, single episode, unspecified: F32.9

## 2012-02-04 HISTORY — DX: Depression, unspecified: F32.A

## 2012-02-04 HISTORY — DX: Panic disorder (episodic paroxysmal anxiety): F41.0

## 2012-02-04 LAB — RAPID URINE DRUG SCREEN, HOSP PERFORMED: Barbiturates: NOT DETECTED

## 2012-02-04 LAB — URINALYSIS, ROUTINE W REFLEX MICROSCOPIC
Bilirubin Urine: NEGATIVE
Ketones, ur: NEGATIVE mg/dL
Leukocytes, UA: NEGATIVE
Nitrite: NEGATIVE
Specific Gravity, Urine: 1.01 (ref 1.005–1.030)
Urobilinogen, UA: 0.2 mg/dL (ref 0.0–1.0)

## 2012-02-04 LAB — CBC WITH DIFFERENTIAL/PLATELET
Basophils Absolute: 0 10*3/uL (ref 0.0–0.1)
Eosinophils Absolute: 0.1 10*3/uL (ref 0.0–0.7)
Eosinophils Relative: 1 % (ref 0–5)
Lymphocytes Relative: 22 % (ref 12–46)
Lymphs Abs: 2.3 10*3/uL (ref 0.7–4.0)
MCV: 91.6 fL (ref 78.0–100.0)
Neutrophils Relative %: 71 % (ref 43–77)
Platelets: 267 10*3/uL (ref 150–400)
RBC: 5.02 MIL/uL (ref 3.87–5.11)
RDW: 13.2 % (ref 11.5–15.5)
WBC: 10.7 10*3/uL — ABNORMAL HIGH (ref 4.0–10.5)

## 2012-02-04 LAB — BASIC METABOLIC PANEL
CO2: 26 mEq/L (ref 19–32)
Calcium: 10.2 mg/dL (ref 8.4–10.5)
Creatinine, Ser: 0.69 mg/dL (ref 0.50–1.10)
GFR calc Af Amer: >90 mL/min (ref >90)
GFR calc non Af Amer: >90 mL/min (ref >90)
Sodium: 138 mEq/L (ref 135–145)

## 2012-02-04 LAB — ETHANOL: Alcohol, Ethyl (B): <11 mg/dL (ref 0–11)

## 2012-02-04 LAB — PREGNANCY, URINE: Preg Test, Ur: NEGATIVE

## 2012-02-04 NOTE — ED Provider Notes (Signed)
History     CSN: [id number]  Arrival date & time 02/04/12  4098   First MD Initiated Contact with Patient 02/04/12 1842      Chief Complaint  Patient presents with  . Panic Attack    HPI Pt was seen at 1555.  Per pt, c/o gradual onset and persistence of acute flair of chronic multiple intermittent episodes of anxiety and panic attacks since "the 1990's."  Pt states she feels the panic attacks "are coming on more frequently" for the past several months.  Pt describes her symptoms as: hyperventilating, feeling anxious, hands and feet cramping.   Pt endorses states she has been compliant with her psych meds and her Psych MD Arfeen appts. Pt states she has an appt with Dr. Lolly Mustache this week.  Denies any change in her usual symptoms.  Denies CP/, no abd pain, no back pain, no N/V/D, no fevers, no SI, no HI.    Past Medical History  Diagnosis Date  . Anxiety   . Hernia   . GERD (gastroesophageal reflux disease)   . Hyperlipemia   . Panic attack   . Depression     Past Surgical History  Procedure Date  . Abdominal hysterectomy     Family History  Problem Relation Age of Onset  . Alcohol abuse Father   . Depression Father   . Depression Sister     History  Substance Use Topics  . Smoking status: Current Every Day Smoker -- 0.5 packs/day for 20 years    Types: Cigarettes  . Smokeless tobacco: Not on file  . Alcohol Use: Yes     socially    Review of Systems ROS: Statement: All systems negative except as marked or noted in the HPI; Constitutional: Negative for fever and chills. ; ; Eyes: Negative for eye pain, redness and discharge. ; ; ENMT: Negative for ear pain, hoarseness, nasal congestion, sinus pressure and sore throat. ; ; Cardiovascular: Negative for chest pain, palpitations, diaphoresis, dyspnea and peripheral edema. ; ; Respiratory: Negative for cough, wheezing and stridor. ; ; Gastrointestinal: Negative for nausea, vomiting, diarrhea, abdominal pain, blood in stool,  hematemesis, jaundice and rectal bleeding. . ; ; Genitourinary: Negative for dysuria, flank pain and hematuria. ; ; Musculoskeletal: Negative for back pain and neck pain. Negative for swelling and trauma.; ; Skin: Negative for pruritus, rash, abrasions, blisters, bruising and skin lesion.; ; Neuro: Negative for headache, lightheadedness and neck stiffness. Negative for weakness, altered level of consciousness , altered mental status, extremity weakness, paresthesias, involuntary movement, seizure and syncope.; Psych:  +anxiety and panic attacks. No SI, no SA, no HI, no hallucinations.      Allergies  Sulfa antibiotics  Home Medications   Current Outpatient Rx  Name Route Sig Dispense Refill  . ALPRAZOLAM 0.5 MG PO TABS Oral Take 0.5 mg by mouth at bedtime as needed. For anxiety and/or sleep    . CITALOPRAM HYDROBROMIDE 20 MG PO TABS Oral Take 20 mg by mouth daily.    . COLESEVELAM HCL 625 MG PO TABS Oral Take 625 mg by mouth 4 (four) times daily.    Marland Kitchen ESTROGENS CONJUGATED 0.3 MG PO TABS Oral Take 0.3 mg by mouth daily.       BP 138/69  Pulse 81  Temp 98 F (36.7 C) (Oral)  Resp 20  Ht 5\' 6"  (1.676 m)  Wt 210 lb (95.255 kg)  BMI 33.89 kg/m2  SpO2 100%  Physical Exam 1600: Physical examination:  Nursing notes reviewed;  Vital signs and O2 SAT reviewed;  Constitutional: Well developed, Well nourished, Well hydrated, In no acute distress; Head:  Normocephalic, atraumatic; Eyes: EOMI, PERRL, No scleral icterus; ENMT: Mouth and pharynx normal, Mucous membranes moist; Neck: Supple, Full range of motion, No lymphadenopathy; Cardiovascular: Regular rate and rhythm, No murmur, rub, or gallop; Respiratory: Breath sounds clear & equal bilaterally, No rales, rhonchi, wheezes.  Speaking full sentences with ease, Normal respiratory effort/excursion; Chest: Nontender, Movement normal; Abdomen: Soft, Nontender, Nondistended, Normal bowel sounds;; Extremities: Pulses normal, No tenderness, No edema, No  calf edema or asymmetry.; Neuro: AA&Ox3, Major CN grossly intact.  Speech clear. No gross focal motor or sensory deficits in extremities.; Skin: Color normal, Warm, Dry.; Psych:  Anxious.    ED Course  Procedures  MDM  MDM Reviewed: previous chart, nursing note and vitals Interpretation: labs     Results for orders placed during the hospital encounter of 02/04/12  BASIC METABOLIC PANEL      Component Value Range   Sodium 138  135 - 145 mEq/L   Potassium 3.8  3.5 - 5.1 mEq/L   Chloride 98  96 - 112 mEq/L   CO2 26  19 - 32 mEq/L   Glucose, Bld 96  70 - 99 mg/dL   BUN 11  6 - 23 mg/dL   Creatinine, Ser 1.61  0.50 - 1.10 mg/dL   Calcium 09.6  8.4 - 04.5 mg/dL   GFR calc non Af Amer >90  >90 mL/min   GFR calc Af Amer >90  >90 mL/min  CBC WITH DIFFERENTIAL      Component Value Range   WBC 10.7 (*) 4.0 - 10.5 K/uL   RBC 5.02  3.87 - 5.11 MIL/uL   Hemoglobin 15.6 (*) 12.0 - 15.0 g/dL   HCT 40.9  81.1 - 91.4 %   MCV 91.6  78.0 - 100.0 fL   MCH 31.1  26.0 - 34.0 pg   MCHC 33.9  30.0 - 36.0 g/dL   RDW 78.2  95.6 - 21.3 %   Platelets 267  150 - 400 K/uL   Neutrophils Relative 71  43 - 77 %   Neutro Abs 7.6  1.7 - 7.7 K/uL   Lymphocytes Relative 22  12 - 46 %   Lymphs Abs 2.3  0.7 - 4.0 K/uL   Monocytes Relative 6  3 - 12 %   Monocytes Absolute 0.6  0.1 - 1.0 K/uL   Eosinophils Relative 1  0 - 5 %   Eosinophils Absolute 0.1  0.0 - 0.7 K/uL   Basophils Relative 0  0 - 1 %   Basophils Absolute 0.0  0.0 - 0.1 K/uL  PREGNANCY, URINE      Component Value Range   Preg Test, Ur NEGATIVE  NEGATIVE  URINALYSIS, ROUTINE W REFLEX MICROSCOPIC      Component Value Range   Color, Urine YELLOW  YELLOW   APPearance CLEAR  CLEAR   Specific Gravity, Urine 1.010  1.005 - 1.030   pH 5.5  5.0 - 8.0   Glucose, UA NEGATIVE  NEGATIVE mg/dL   Hgb urine dipstick NEGATIVE  NEGATIVE   Bilirubin Urine NEGATIVE  NEGATIVE   Ketones, ur NEGATIVE  NEGATIVE mg/dL   Protein, ur NEGATIVE  NEGATIVE mg/dL     Urobilinogen, UA 0.2  0.0 - 1.0 mg/dL   Nitrite NEGATIVE  NEGATIVE   Leukocytes, UA NEGATIVE  NEGATIVE  URINE RAPID DRUG SCREEN (HOSP PERFORMED)      Component Value  Range   Opiates NONE DETECTED  NONE DETECTED   Cocaine NONE DETECTED  NONE DETECTED   Benzodiazepines NONE DETECTED  NONE DETECTED   Amphetamines NONE DETECTED  NONE DETECTED   Tetrahydrocannabinol NONE DETECTED  NONE DETECTED   Barbiturates NONE DETECTED  NONE DETECTED  ETHANOL      Component Value Range   Alcohol, Ethyl (B) <11  0 - 11 mg/dL     9147:  EPIC chart reviewed: pt has been eval by Dr. Lolly Mustache monthly for the past 7 months for these same symptoms.  T/C to [facility]: pt does not meet inpt criteria.  Pt without panic attack during her ED stay. Ready to go home now. Will continue her meds and f/u with Psych MD this week as previously scheduled. Dx and testing d/w pt and family.  Questions answered.  Verb understanding, agreeable to d/c home with outpt f/u.          Laray Anger, DO 02/07/12 1142

## 2012-02-04 NOTE — ED Notes (Signed)
Pt via EMS with complaints of panic attack that started around an hour PTA. IV started in left hand by EMS

## 2012-03-11 ENCOUNTER — Ambulatory Visit (INDEPENDENT_AMBULATORY_CARE_PROVIDER_SITE_OTHER): Payer: 59 | Admitting: Psychiatry

## 2012-03-11 ENCOUNTER — Encounter (HOSPITAL_COMMUNITY): Payer: Self-pay | Admitting: Psychiatry

## 2012-03-11 VITALS — BP 132/80 | HR 64 | Ht 66.75 in | Wt 214.4 lb

## 2012-03-11 DIAGNOSIS — F418 Other specified anxiety disorders: Secondary | ICD-10-CM | POA: Insufficient documentation

## 2012-03-11 DIAGNOSIS — F5105 Insomnia due to other mental disorder: Secondary | ICD-10-CM | POA: Insufficient documentation

## 2012-03-11 DIAGNOSIS — F329 Major depressive disorder, single episode, unspecified: Secondary | ICD-10-CM

## 2012-03-11 MED ORDER — CITALOPRAM HYDROBROMIDE 20 MG PO TABS: 20.0000 mg | ORAL_TABLET | Freq: Every day | ORAL | Status: DC

## 2012-03-11 MED ORDER — GABAPENTIN 100 MG PO CAPS: ORAL_CAPSULE | ORAL | Status: DC
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
## 2012-03-11 NOTE — Patient Instructions (Signed)
Lovaza is a reasonable option in the management of high cholesterol.  Lovaza could be helpful for memory also.  Stop Xanax.  Use Neurontin for anxiety and INSOMNIA.

## 2012-04-09 ENCOUNTER — Ambulatory Visit (HOSPITAL_COMMUNITY): Payer: Self-pay | Admitting: Psychiatry

## 2013-04-13 ENCOUNTER — Encounter (HOSPITAL_COMMUNITY): Payer: Self-pay

## 2013-04-13 ENCOUNTER — Other Ambulatory Visit (HOSPITAL_COMMUNITY): Payer: 59 | Attending: Psychiatry | Admitting: Psychiatry

## 2013-04-13 DIAGNOSIS — F418 Other specified anxiety disorders: Secondary | ICD-10-CM

## 2013-04-13 DIAGNOSIS — F410 Panic disorder [episodic paroxysmal anxiety] without agoraphobia: Secondary | ICD-10-CM | POA: Insufficient documentation

## 2013-04-13 DIAGNOSIS — F341 Dysthymic disorder: Secondary | ICD-10-CM | POA: Insufficient documentation

## 2013-04-13 MED ORDER — CHLORPROMAZINE HCL 25 MG PO TABS: 25.0000 mg | ORAL_TABLET | Freq: Every day | ORAL | Status: DC
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
## 2013-04-13 NOTE — Progress Notes (Signed)
    Daily Group Progress Note  Program: IOP  Group Time: 9:00-10:30 am   Participation Level: None  Behavioral Response: none  Type of Therapy:  Initial Assessment  Summary of Progress: Pt completed the initial assessment and orientation process with case manager      Group Time: 10:30 am - 12:00 pm   Participation Level:  None  Behavioral Response: none  Type of Therapy: Medical Assessment  Summary of Progress: Pt completed the initial medical assessment by the psychiatrist   Carman Ching, LCSW

## 2013-04-14 ENCOUNTER — Other Ambulatory Visit (HOSPITAL_COMMUNITY): Payer: 59 | Admitting: Psychiatry

## 2013-04-14 DIAGNOSIS — F418 Other specified anxiety disorders: Secondary | ICD-10-CM

## 2013-04-15 ENCOUNTER — Other Ambulatory Visit (HOSPITAL_COMMUNITY): Payer: 59 | Admitting: Psychiatry

## 2013-04-15 DIAGNOSIS — F418 Other specified anxiety disorders: Secondary | ICD-10-CM

## 2013-04-15 NOTE — Progress Notes (Signed)
    Daily Group Progress Note  Program: IOP  Group Time: 9:00-10:30 am   Participation Level: Active  Behavioral Response: Appropriate  Type of Therapy:  Process Group  Summary of Progress: Pt participated in a holiday party within the group to practice having fun and healthy traditions and also talked about fears and ways to manage the Christmas holiday.        Group Time: 10:30 am - 12:00 pm   Participation Level:  Active  Behavioral Response: Appropriate  Type of Therapy: Psycho-education Group  Summary of Progress:  Pt learned about the DBT skill of Mindfulness and how to use it to manage stress and depression by being in the moment.    Maryln Eastham E, LCSW 

## 2013-04-15 NOTE — Progress Notes (Signed)
    Daily Group Progress Note  Program: IOP  Group Time: 9:00-10:30 am   Participation Level: Active  Behavioral Response: Appropriate  Type of Therapy:  Process Group  Summary of Progress: Pt worked on her goal of anxiety today by talking about her panic attacks and symptoms associated with them.      Group Time: 10:30 am - 12:00 pm   Participation Level:  Active  Behavioral Response: Appropriate  Type of Therapy: Psycho-education Group  Summary of Progress: Pt learned the skill of heartmath to manage anxiety symptoms and practiced relaxation breathing.   Carman Ching, LCSW

## 2013-04-17 ENCOUNTER — Other Ambulatory Visit (HOSPITAL_COMMUNITY): Payer: 59

## 2013-04-20 ENCOUNTER — Other Ambulatory Visit (HOSPITAL_COMMUNITY): Payer: 59 | Admitting: Psychiatry

## 2013-04-20 DIAGNOSIS — F418 Other specified anxiety disorders: Secondary | ICD-10-CM

## 2013-04-20 NOTE — Progress Notes (Signed)
    Daily Group Progress Note  Program: IOP  Group Time: 9:00-10:30 am   Participation Level: Active  Behavioral Response: Appropriate  Type of Therapy:  Process Group  Summary of Progress: pt worked on her goal of depression by processing feelings of emptiness she has over not having a romantic companion and the lonliness this causes her. Pt said she wants to have someone in her life to help make that part of her life more fulfilling. Pt said it was helpful to receive permission that those feelings were justified.      Group Time: 10:30 am - 12:00 pm   Participation Level:  Active  Behavioral Response: Appropriate  Type of Therapy: Psycho-education Group  Summary of Progress: Pt participated in a group with a focus on grief and loss and explored current losses impacting overall wellness and effective grieving strategies.  Carman Ching, LCSW

## 2013-04-21 ENCOUNTER — Other Ambulatory Visit (HOSPITAL_COMMUNITY): Payer: 59 | Admitting: Psychiatry

## 2013-04-21 DIAGNOSIS — F418 Other specified anxiety disorders: Secondary | ICD-10-CM
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
## 2013-04-22 ENCOUNTER — Other Ambulatory Visit (HOSPITAL_COMMUNITY): Payer: 59 | Admitting: Psychiatry

## 2013-04-22 DIAGNOSIS — F418 Other specified anxiety disorders: Secondary | ICD-10-CM

## 2013-04-22 MED ORDER — DULOXETINE HCL 30 MG PO CPEP: 30.0000 mg | ORAL_CAPSULE | Freq: Two times a day (BID) | ORAL | Status: DC
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
## 2013-04-22 NOTE — Progress Notes (Signed)
Patient ID: Hailey Owen, female   DOB: December 16, 1970, 42 y.o.   MRN: [id number] Patient interviewed today, states that her depression continues to be worsening and that she's been on the Celexa for a year and notices no change. Patient complains of feeling anxious depressed ruminating about being a burden on her mother and feels guilty about that. Discussed rationale risks benefits options of Cymbalta and patient gave me her informed consent. She'll start Cymbalta 30 mg at bedtime and she'll decrease her Celexa to 20 mg every day. She continue all her other medications. Denies suicidal or homicidal ideation and has no hallucinations or delusions. Her sleep has improved significantly on the Thorazine.

## 2013-04-24 ENCOUNTER — Other Ambulatory Visit (HOSPITAL_COMMUNITY): Payer: 59 | Attending: Psychiatry

## 2013-04-24 DIAGNOSIS — F410 Panic disorder [episodic paroxysmal anxiety] without agoraphobia: Secondary | ICD-10-CM | POA: Insufficient documentation

## 2013-04-24 DIAGNOSIS — F411 Generalized anxiety disorder: Secondary | ICD-10-CM | POA: Insufficient documentation

## 2013-04-24 DIAGNOSIS — F332 Major depressive disorder, recurrent severe without psychotic features: Secondary | ICD-10-CM | POA: Insufficient documentation

## 2013-04-27 ENCOUNTER — Other Ambulatory Visit (HOSPITAL_COMMUNITY): Payer: 59 | Admitting: Psychiatry

## 2013-04-27 DIAGNOSIS — F418 Other specified anxiety disorders: Secondary | ICD-10-CM

## 2013-04-27 NOTE — Progress Notes (Signed)
Patient ID: Hailey Owen, female   DOB: 01/30/1971, 43 y.o.   MRN: [id number] A:  Placed call to [facility] ([phone]), spoke with Alvino ChapelJo to inquire about steps to take for patient to get paid.  According to Alvino ChapelJo, there are forms that need to be completed by the doctor.  States she will fax them over today.
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
## 2013-04-28 ENCOUNTER — Other Ambulatory Visit (HOSPITAL_COMMUNITY): Payer: 59 | Admitting: Psychiatry

## 2013-04-28 DIAGNOSIS — F418 Other specified anxiety disorders: Secondary | ICD-10-CM

## 2013-04-28 MED ORDER — DULOXETINE HCL 30 MG PO CPEP: 30.0000 mg | ORAL_CAPSULE | Freq: Two times a day (BID) | ORAL | Status: DC

## 2013-04-29 ENCOUNTER — Other Ambulatory Visit (HOSPITAL_COMMUNITY): Payer: 59

## 2013-04-29 ENCOUNTER — Telehealth (HOSPITAL_COMMUNITY): Payer: Self-pay | Admitting: Psychiatry

## 2013-04-29 NOTE — Progress Notes (Signed)
    Daily Group Progress Note  Program: IOP  Group Time: 9:00-10:30 am   Participation Level: Active  Behavioral Response: Appropriate  Type of Therapy:  Process Group  Summary of Progress: Pt worked on her goal of depression today. Pt was very focused on trying to fix her cousins mental illness and was redirected back to how she can manage her own wellness given she interacts with him and he is struggling. Pt worked on setting better boundaries with her tendency to want to help others.      Group Time: 10:30 am - 12:00 pm   Participation Level:  Active  Behavioral Response: Appropriate  Type of Therapy: Psycho-education Group  Summary of Progress: Pt learned the skill of progressive muscle relaxation and discussed how it can be used to manage anxiety and depression symptoms. Pt practiced the skill during group and learned ways to access it at home for continued use.   Carman ChingENGLEHORN,Gwenna Fuston E, LCSW

## 2013-04-30 ENCOUNTER — Encounter (HOSPITAL_COMMUNITY): Payer: Self-pay | Admitting: Psychiatry

## 2013-04-30 ENCOUNTER — Other Ambulatory Visit (HOSPITAL_COMMUNITY): Payer: 59 | Admitting: Psychiatry

## 2013-04-30 DIAGNOSIS — F329 Major depressive disorder, single episode, unspecified: Secondary | ICD-10-CM

## 2013-04-30 DIAGNOSIS — F410 Panic disorder [episodic paroxysmal anxiety] without agoraphobia: Secondary | ICD-10-CM
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
## 2013-05-01 ENCOUNTER — Other Ambulatory Visit (HOSPITAL_COMMUNITY): Payer: 59

## 2013-05-04 ENCOUNTER — Other Ambulatory Visit (HOSPITAL_COMMUNITY): Payer: 59

## 2013-05-04 ENCOUNTER — Emergency Department (HOSPITAL_COMMUNITY)
Admission: EM | Admit: 2013-05-04 | Discharge: 2013-05-04 | Disposition: A | Discharge status: Home / Self Care | Payer: 59 | Source: Home / Self Care | Attending: Emergency Medicine | Admitting: Emergency Medicine

## 2013-05-04 ENCOUNTER — Encounter (HOSPITAL_COMMUNITY): Payer: Self-pay | Admitting: Emergency Medicine

## 2013-05-04 DIAGNOSIS — J019 Acute sinusitis, unspecified: Secondary | ICD-10-CM

## 2013-05-04 DIAGNOSIS — H6690 Otitis media, unspecified, unspecified ear: Secondary | ICD-10-CM

## 2013-05-04 DIAGNOSIS — H6691 Otitis media, unspecified, right ear: Secondary | ICD-10-CM

## 2013-05-04 MED ORDER — AMOXICILLIN-POT CLAVULANATE 875-125 MG PO TABS: 1.0000 | ORAL_TABLET | Freq: Two times a day (BID) | ORAL | Status: DC

## 2013-05-04 MED ORDER — HYDROCOD POLST-CHLORPHEN POLST 10-8 MG/5ML PO LQCR: 5.0000 mL | Freq: Two times a day (BID) | ORAL | Status: DC | PRN

## 2013-05-04 NOTE — ED Provider Notes (Signed)
  Chief Complaint   Chief Complaint  Patient presents with  . URI    History of Present Illness   Hailey Owen is a 43 year old female who has had a one-week history of nasal congestion with greenish, bloody drainage, sinus pressure, dry cough, chest tightness, chest pain, earache, dizziness, headache, chills, sore throat, and anorexia. She's had no specific sick exposures.  Review of Systems   Other than as noted above, the patient denies any of the following symptoms: Systemic:  No fevers, chills, sweats, or myalgias. Eye:  No redness or discharge. ENT:  No ear pain, headache, nasal congestion, drainage, sinus pressure, or sore throat. Neck:  No neck pain, stiffness, or swollen glands. Lungs:  No cough, sputum production, hemoptysis, wheezing, chest tightness, shortness of breath or chest pain. GI:  No abdominal pain, nausea, vomiting or diarrhea.  PMFSH   Past medical history, family history, social history, meds, and allergies were reviewed. She is allergic to Septra. She takes Xanax, Celexa, and Cymbalta.  Physical exam   Vital signs:  BP 135/60  Pulse 90  Temp(Src) 98.5 F (36.9 C) (Oral)  Resp 16  SpO2 98% General:  Alert and oriented.  In no distress.  Skin warm and dry. Eye:  No conjunctival injection or drainage. Lids were normal. ENT:  The left TM was normal, the right TM was bulging with pus behind the TM, canals were clear.  Nasal mucosa was clear and uncongested, without drainage.  Mucous membranes were moist.  Pharynx was clear with no exudate or drainage.  There were no oral ulcerations or lesions. Neck:  Supple, no adenopathy, tenderness or mass. Lungs:  No respiratory distress.  Lungs were clear to auscultation, without wheezes, rales or rhonchi.  Breath sounds were clear and equal bilaterally.  Heart:  Regular rhythm, without gallops, murmers or rubs. Skin:  Clear, warm, and dry, without rash or lesions.   Assessment     The primary encounter diagnosis  was Acute sinusitis. A diagnosis of Right otitis media was also pertinent to this visit.  Plan    1.  Meds:  The following meds were prescribed:   Discharge Medication List as of 05/04/2013  2:57 PM    START taking these medications   Details  amoxicillin-clavulanate (AUGMENTIN) 875-125 MG per tablet Take 1 tablet by mouth 2 (two) times daily., Starting 05/04/2013, Until Discontinued, Normal    chlorpheniramine-HYDROcodone (TUSSIONEX) 10-8 MG/5ML LQCR Take 5 mLs by mouth every 12 (twelve) hours as needed for cough., Starting 05/04/2013, Until Discontinued, Normal        2.  Patient Education/Counseling:  The patient was given appropriate handouts, self care instructions, and instructed in symptomatic relief.  Instructed to get extra fluids, rest, and use a cool mist vaporizer.   3.  Follow up:  The patient was told to follow up here if no better in 3 to 4 days, or sooner if becoming worse in any way, and given some red flag symptoms such as increasing fever, difficulty breathing, chest pain, or persistent vomiting which would prompt immediate return.  Follow up here as needed.      Reuben Likesavid C Almadelia Looman, MD 05/04/13 [phone]

## 2013-05-04 NOTE — Discharge Instructions (Signed)

## 2013-05-04 NOTE — ED Notes (Signed)
Pt         Reports           Symptoms  Of  Cough   Congested   With  Nasal  Drainage         sorethroat      Body  Aches          X  1  Week

## 2013-05-05 ENCOUNTER — Other Ambulatory Visit (HOSPITAL_COMMUNITY): Payer: 59

## 2013-05-06 ENCOUNTER — Other Ambulatory Visit (HOSPITAL_COMMUNITY): Payer: 59

## 2013-05-07 ENCOUNTER — Ambulatory Visit (HOSPITAL_COMMUNITY): Payer: 59 | Admitting: Psychiatry

## 2013-05-07 ENCOUNTER — Other Ambulatory Visit (HOSPITAL_COMMUNITY): Payer: 59 | Admitting: Psychiatry

## 2013-05-07 DIAGNOSIS — F418 Other specified anxiety disorders: Secondary | ICD-10-CM
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
## 2013-05-08 ENCOUNTER — Other Ambulatory Visit (HOSPITAL_COMMUNITY): Payer: 59

## 2013-05-11 ENCOUNTER — Other Ambulatory Visit (HOSPITAL_COMMUNITY): Payer: 59 | Admitting: Psychiatry

## 2013-05-11 DIAGNOSIS — F418 Other specified anxiety disorders: Secondary | ICD-10-CM

## 2013-05-11 NOTE — Progress Notes (Signed)
    Daily Group Progress Note  Program: IOP  Group Time: 9:00-10:30 am   Participation Level: Active  Behavioral Response: Appropriate  Type of Therapy:  Process Group  Summary of Progress: Pt worked on her goal of depression by exploring how her lack of social life was impacting her feels of sadness. Pt said she has been dating a man since starting in the group and has been on four dates. Pt is finding that she was spending too much time with her mother and not enough time out having fun. Pt said she is feeling better as she increases her own socialization with others, but is battling feelings of guilt over leaving her mother at home.      Group Time: 10:30 am - 12:00 pm   Participation Level:  Active  Behavioral Response: Appropriate  Type of Therapy: Psycho-education Group  Summary of Progress: Pt participated in a group with a focus on grief and loss and identified current losses impacting overall wellness.   Carman ChingENGLEHORN,Omer Monter E, LCSW

## 2013-05-12 ENCOUNTER — Other Ambulatory Visit (HOSPITAL_COMMUNITY): Payer: 59 | Admitting: Psychiatry

## 2013-05-12 DIAGNOSIS — F418 Other specified anxiety disorders: Secondary | ICD-10-CM

## 2013-05-13 ENCOUNTER — Other Ambulatory Visit (HOSPITAL_COMMUNITY): Payer: 59

## 2013-05-13 NOTE — Progress Notes (Signed)
    Daily Group Progress Note  Program: IOP  Group Time: 9:00-10:30 am   Participation Level: Active  Behavioral Response: Appropriate  Type of Therapy:  Process Group  Summary of Progress: Pt reports improved mood. Pt learned the skill of Mindfulness and identified how to use this skill to manage personal symptoms of depression, anxiety and stress.      Group Time: 10:30 am - 12:00 pm   Participation Level:  Active  Behavioral Response: Appropriate  Type of Therapy: Psycho-education Group  Summary of Progress:  Pt practiced the skill of mindfulness using a guided mindfulness meditation and reported a significant reduce in stress following this activity. Pt then talked about how they would use the skill going forward to manage personal stressors.    Rambo Sarafian E, LCSW 

## 2013-05-14 ENCOUNTER — Other Ambulatory Visit (HOSPITAL_COMMUNITY): Payer: 59 | Admitting: Psychiatry

## 2013-05-14 DIAGNOSIS — F418 Other specified anxiety disorders: Secondary | ICD-10-CM

## 2013-05-15 ENCOUNTER — Other Ambulatory Visit (HOSPITAL_COMMUNITY): Payer: 59

## 2013-05-15 NOTE — Progress Notes (Signed)
Patient ID: Hailey Owen, female   DOB: 05/27/1970, 43 y.o.   MRN: [id number] D:  Pt wasn't discharged today, but will be discharged on 05-18-13, with a return to work of 05-19-13 (without restrictions).  A:  D/C on 05-19-13.  Follow up with Dr. Lolly MustacheArfeen on 05-20-13 @ 2pm.  Will refer pt to a therapist.  Encouraged support groups.  R:  Pt receptive.

## 2013-05-15 NOTE — Progress Notes (Signed)
    Daily Group Progress Note  Program: IOP  Group Time: 9:00-10:30 am   Participation Level: Active  Behavioral Response: Appropriate  Type of Therapy:  Process Group  Summary of Progress: Pt worked on her goal of depression by reporting continued progress with mood and has elevated affect and positive thinking. Pt said she feels ready to end group tomorrow on her scheduled discharge date and plans to return to her job on Monday. Pt said hearing others job struggles has given her a greater appreciation for her own job and she described how she plans to manage her anxiety when she returns to work.      Group Time: 10:30 am - 12:00 pm   Participation Level:  Active  Behavioral Response: Appropriate  Type of Therapy: Psycho-education Group  Summary of Progress: Pt was introduced to how to set healthy boundaries with others and with self to ensure optimal wellness. Pt learned about her personality style and how this impacts her ability to set healthy limits and how to challenge it.   Carman ChingENGLEHORN,Damari Hiltz E, LCSW

## 2013-05-18 ENCOUNTER — Other Ambulatory Visit (HOSPITAL_COMMUNITY): Payer: 59 | Admitting: Psychiatry

## 2013-05-18 DIAGNOSIS — F418 Other specified anxiety disorders: Secondary | ICD-10-CM

## 2013-05-18 MED ORDER — DULOXETINE HCL 30 MG PO CPEP
30.0000 mg | ORAL_CAPSULE | Freq: Two times a day (BID) | ORAL | Status: DC
Start: 2013-05-18 — End: 2013-05-20

## 2013-05-18 MED ORDER — CHLORPROMAZINE HCL 25 MG PO TABS: 25.0000 mg | ORAL_TABLET | Freq: Every day | ORAL | Status: DC

## 2013-05-18 MED ORDER — ALPRAZOLAM 1 MG PO TABS: 1.0000 mg | ORAL_TABLET | Freq: Two times a day (BID) | ORAL | Status: DC | PRN

## 2013-05-18 MED ORDER — CITALOPRAM HYDROBROMIDE 20 MG PO TABS: 20.0000 mg | ORAL_TABLET | Freq: Every day | ORAL | Status: DC

## 2013-05-18 NOTE — Progress Notes (Signed)
Discharge Note  Patient:  Hailey Owen is an 43 y.o., female DOB:  09/27/1970  Date of Admission:  04/13/13  Date of Discharge:  05/18/13  Reason for Admission: 43 year old divorced white female referred by Dr.Arfeenncreasing depression and anxiety.  Hospital Course: Patient started IOP and because of her severe depression her Celexa was decreased to 20 mg and she was started on Cymbalta 30 mg twice a day. She was continued on her Xanax 1 mg twice a day. Patient tolerated this combination well but had significant problems with her sleep and so to help her insomnia she was started on Thorazine 25 mg tablet which helped her. Patient gradually started stabilizing and opened up in groups talking about her stressors, was able to give and accepted feedback. She worried about her mother who is old and worries about her dying. She acquired good coping skills and learnt CBT and hart math. Her sleep and appetite were good mood was stable her anxiety had decreased significantly and she felt comfortable returning to work. She had no suicidal or homicidal ideation was tolerating her medications well and was coping well.  Mental Status at Discharge: Alert, oriented x3, affect was full mood was euthymic, speech and language were normal. No suicidal or homicidal ideation. No hallucinations. Recent and remote memory was good, and knowledge was good, judgment and insight was good concentration and recall are good.  Lab Results: No results found for this or any previous visit (from the past 48 hour(s)).  Current outpatient prescriptions:ALPRAZolam (XANAX) 1 MG tablet, Take 1 tablet (1 mg total) by mouth 2 (two) times daily as needed for anxiety., Disp: 30 tablet, Rfl: 0;  amoxicillin-clavulanate (AUGMENTIN) 875-125 MG per tablet, Take 1 tablet by mouth 2 (two) times daily., Disp: 20 tablet, Rfl: 0;  chlorpheniramine-HYDROcodone (TUSSIONEX) 10-8 MG/5ML LQCR, Take 5 mLs by mouth every 12 (twelve) hours as needed for  cough., Disp: 140 mL, Rfl: 0 chlorproMAZINE (THORAZINE) 25 MG tablet, Take 1 tablet (25 mg total) by mouth at bedtime., Disp: 30 tablet, Rfl: 0;  citalopram (CELEXA) 20 MG tablet, Take 1 tablet (20 mg total) by mouth daily. Take two tabs daily., Disp: 30 tablet, Rfl: 0;  DULoxetine (CYMBALTA) 30 MG capsule, Take 1 capsule (30 mg total) by mouth 2 (two) times daily., Disp: 60 capsule, Rfl: 0 estrogens, conjugated, (PREMARIN) 0.3 MG tablet, Take 0.3 mg by mouth daily. , Disp: , Rfl: ;  simvastatin (ZOCOR) 10 MG tablet, Take 10 mg by mouth daily., Disp: , Rfl:   Axis Diagnosis:   Axis I: Generalized Anxiety Disorder, Major Depression, Recurrent severe and Panic Disorder Axis II: Cluster B Traits Axis III:  Past Medical History  Diagnosis Date  . Anxiety   . Hernia   . GERD (gastroesophageal reflux disease)   . Hyperlipemia   . Panic attack   . Depression    Axis IV: occupational problems, other psychosocial or environmental problems, problems related to social environment and problems with primary support group Axis V: 61-70 mild symptoms   Level of Care:  OP  Discharge destination:  Home  Is patient on multiple antipsychotic therapies at discharge:  No    Has Patient had three or more failed trials of antipsychotic monotherapy by history:  No  Patient phone:  [phone] (home)  Patient address:   [address] [place] [address],   Follow-up recommendations:  Activity:  As tolerated Diet:  Regular Other:  Followup for medications with Dr.Arfeen and patient will  call for a therapist at [facility] as she wants to be on a sliding scale.  Comments:  None  The patient received suicide prevention pamphlet:  Yes   Margit Banda 05/18/2013, 11:50 AM

## 2013-05-18 NOTE — Progress Notes (Signed)
    Daily Group Progress Note  Program: IOP  Group Time: 9:00-10:30 am   Participation Level: Active  Behavioral Response: Appropriate  Type of Therapy:  Process Group  Summary of Progress: Pt ended group today. She said she did not come on Friday because she was overwhelmed with things gong on in her personal life. Pt said her mood has improved and she feels ready to return to work. She said she no longer has depression or panic attacks and feels she has the tools to manage them effectively if they return.      Group Time: 10:30 am - 12:00 pm   Participation Level:  Active  Behavioral Response: Appropriate  Type of Therapy: Psycho-education Group  Summary of Progress: Pt continued learning about effective boundary setting and ways to set healthier limits with others to ensure wellness.   Carman ChingENGLEHORN,Fortune Brannigan E, LCSW
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
## 2013-05-18 NOTE — Patient Instructions (Signed)
Patient completed MH-IOP today.  Will follow up with Dr. Lolly MustacheArfeen on 05-20-13 @ 2pm.  Pt is requesting a therapist on a sliding scale.  [facility] [phone]).  Encouraged support groups.  Return to work on 05-19-13, without any restrictions.

## 2013-05-19 ENCOUNTER — Other Ambulatory Visit (HOSPITAL_COMMUNITY): Payer: 59

## 2013-05-20 ENCOUNTER — Other Ambulatory Visit (HOSPITAL_COMMUNITY): Payer: 59

## 2013-05-20 ENCOUNTER — Ambulatory Visit (HOSPITAL_COMMUNITY): Payer: Self-pay | Admitting: Psychiatry

## 2013-05-20 ENCOUNTER — Encounter (HOSPITAL_COMMUNITY): Payer: Self-pay | Admitting: Psychiatry

## 2013-05-20 ENCOUNTER — Ambulatory Visit (INDEPENDENT_AMBULATORY_CARE_PROVIDER_SITE_OTHER): Payer: 59 | Admitting: Psychiatry

## 2013-05-20 VITALS — BP 170/80 | HR 76 | Ht 68.0 in | Wt 221.6 lb

## 2013-05-20 DIAGNOSIS — F329 Major depressive disorder, single episode, unspecified: Secondary | ICD-10-CM

## 2013-05-20 MED ORDER — CHLORPROMAZINE HCL 25 MG PO TABS
25.0000 mg | ORAL_TABLET | Freq: Every day | ORAL | Status: DC
Start: 2013-05-20 — End: 2013-07-10

## 2013-05-20 MED ORDER — CITALOPRAM HYDROBROMIDE 20 MG PO TABS: 20.0000 mg | ORAL_TABLET | Freq: Every day | ORAL | Status: DC

## 2013-05-20 MED ORDER — DULOXETINE HCL 30 MG PO CPEP: 30.0000 mg | ORAL_CAPSULE | Freq: Two times a day (BID) | ORAL | Status: DC

## 2013-05-20 MED ORDER — ALPRAZOLAM 1 MG PO TABS: 1.0000 mg | ORAL_TABLET | Freq: Two times a day (BID) | ORAL | Status: DC | PRN
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
## 2013-05-21 ENCOUNTER — Other Ambulatory Visit (HOSPITAL_COMMUNITY): Payer: 59

## 2013-05-21 MED ORDER — CITALOPRAM HYDROBROMIDE 20 MG PO TABS: 20.0000 mg | ORAL_TABLET | Freq: Every day | ORAL | Status: DC

## 2013-05-21 NOTE — Addendum Note (Signed)
Addended by: Kathryne SharperARFEEN, Jakaila Norment T on: 05/21/2013 09:53 AM   Modules accepted: Orders

## 2013-05-22 ENCOUNTER — Other Ambulatory Visit (HOSPITAL_COMMUNITY): Payer: 59

## 2013-05-29 ENCOUNTER — Ambulatory Visit (INDEPENDENT_AMBULATORY_CARE_PROVIDER_SITE_OTHER): Payer: 59 | Admitting: Family Medicine

## 2013-05-29 VITALS — BP 110/70 | HR 83 | Temp 98.0°F | Resp 16 | Ht 66.5 in | Wt 221.0 lb

## 2013-05-29 DIAGNOSIS — J040 Acute laryngitis: Secondary | ICD-10-CM

## 2013-05-29 DIAGNOSIS — J329 Chronic sinusitis, unspecified: Secondary | ICD-10-CM

## 2013-05-29 DIAGNOSIS — J209 Acute bronchitis, unspecified: Secondary | ICD-10-CM

## 2013-05-29 MED ORDER — CEFDINIR 300 MG PO CAPS: 600.0000 mg | ORAL_CAPSULE | Freq: Every day | ORAL | Status: DC

## 2013-05-29 NOTE — Patient Instructions (Signed)
Drink plenty of fluids and try to speak as little as possible  Take the [facility] one twice daily for 2 weeks  Return if worse or not improving  Continue using the cough syrup when necessary he have an as needed

## 2013-05-29 NOTE — Progress Notes (Signed)
Subjective: 43 year old lady who's been sick for about 3 weeks. She went to another urgent care over at [place] where she was treated with amoxicillin for a sinus infection. She seemed to be doing some better than she coughs a lot still. Recently she has gotten worse and now has developed a pretty bad laryngitis. She's been coughing so much that her chest wall and abdomen hurt. She does continue to smoke about a 5 or so cigarettes a day. She normally smokes about a pack a day. She has not been blowing a lot of her nose but the ears do not hurt. The right especially. She is coughing up purulent phlegm.  Objective: Pleasant alert lady who is very hoarse. 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 She had to leave work today because she couldn't answer the call bells and phone calls. Her left TM is slightly dull. Right TM has multiple blebs behind it. Throat is only mildly erythematous. Voice is very worse. Neck supple without major nodes. Chest is clear to auscultation. Maxillary and frontal sinuses are tender.  Assessment: Sinusitis/bronchitis/laryngitis  Plan: Omnicef one twice daily Drink plenty of fluids Rest voice

## 2013-06-02 ENCOUNTER — Telehealth (HOSPITAL_COMMUNITY): Payer: Self-pay

## 2013-06-02 ENCOUNTER — Telehealth: Payer: Self-pay

## 2013-06-02 NOTE — Telephone Encounter (Signed)
Patient states she is not feeling better and would like a Z pack called in if possible. [facility]   [phone] (c) [phone] (h)

## 2013-06-02 NOTE — Telephone Encounter (Signed)
I returned patient's phone call I left a message. 

## 2013-06-02 NOTE — Telephone Encounter (Signed)
Pt has questions about the side effects of her medication she believes that it is causing her blood pressure to rise and she feels nauseated and dizzy  Best number [phone]

## 2013-06-04 NOTE — Telephone Encounter (Signed)
Message and PA response noted.

## 2013-06-04 NOTE — Telephone Encounter (Signed)
Called pt and she was unavailable at the time. Try again later.

## 2013-06-04 NOTE — Telephone Encounter (Signed)
Pt had an adverse reaction to the omnicef- nausea. She has taken in Z-pak in the past [facility].  Please call back at work: [phone]

## 2013-06-04 NOTE — Telephone Encounter (Signed)
The cough medication the patient was given is more likely to cause nausea, so please make sure that it's the [facility].  I am happy to prescribe a Zpak, though it is less effective than the [facility] for coverage of sinusitis.

## 2013-06-04 NOTE — Telephone Encounter (Signed)
Lm for rtn call 

## 2013-06-08 NOTE — Telephone Encounter (Signed)
Called pt left message for patient to call back if she still has questions.

## 2013-07-07 ENCOUNTER — Ambulatory Visit (HOSPITAL_COMMUNITY): Payer: Self-pay | Admitting: Psychiatry

## 2013-07-10 ENCOUNTER — Ambulatory Visit (INDEPENDENT_AMBULATORY_CARE_PROVIDER_SITE_OTHER): Payer: 59 | Admitting: Psychiatry

## 2013-07-10 ENCOUNTER — Encounter (HOSPITAL_COMMUNITY): Payer: Self-pay | Admitting: Psychiatry

## 2013-07-10 VITALS — BP 144/98 | HR 89 | Wt 224.0 lb

## 2013-07-10 DIAGNOSIS — F339 Major depressive disorder, recurrent, unspecified: Secondary | ICD-10-CM

## 2013-07-10 DIAGNOSIS — F329 Major depressive disorder, single episode, unspecified: Secondary | ICD-10-CM

## 2013-07-10 MED ORDER — DULOXETINE HCL 30 MG PO CPEP: 30.0000 mg | ORAL_CAPSULE | Freq: Two times a day (BID) | ORAL | Status: DC

## 2013-07-10 MED ORDER — LAMOTRIGINE 25 MG PO TABS: ORAL_TABLET | ORAL | Status: DC

## 2013-07-10 MED ORDER — CLONAZEPAM 1 MG PO TABS: 1.0000 mg | ORAL_TABLET | Freq: Two times a day (BID) | ORAL | Status: DC
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
## 2013-07-10 NOTE — Patient Instructions (Signed)
Discontinue Celexa.  Start Lamictal 25 mg daily for one week and then 2 tablet daily.  Please call immediately if you have any rash.  Keep your appointment with her primary care physician today and get a neurology referral for more workup.

## 2013-07-14 ENCOUNTER — Telehealth (HOSPITAL_COMMUNITY): Payer: Self-pay | Admitting: *Deleted

## 2013-07-14 NOTE — Telephone Encounter (Signed)
Patient left 2 VM with questions concerning possible side effect of new medicine started Friday: First VM:Saw MD Friday.Started on new medicine.B/P was up when MD cheked it Friday.Has continued 168-175/80-93 since Saturday when new medicine started.Is increase in B/P due to this new medicine? 2nd VM: Left Cell number as no longer at work. Contacted pt at 1602-left message: Per Dr. Lucianne MussKumar (in Dr. Sheela StackArfeen's absence), highly unlike ly that Lamictal caused increase in B/P.As she has only been on it for 3 days, may stop it if she wants to.Message will be given to Dr. Lolly MustacheArfeen electronically.

## 2013-08-06 ENCOUNTER — Other Ambulatory Visit (HOSPITAL_COMMUNITY): Payer: Self-pay | Admitting: Psychiatry

## 2013-08-06 DIAGNOSIS — F329 Major depressive disorder, single episode, unspecified: Secondary | ICD-10-CM

## 2013-08-07 ENCOUNTER — Other Ambulatory Visit (HOSPITAL_COMMUNITY): Payer: Self-pay | Admitting: Psychiatry

## 2013-08-07 ENCOUNTER — Ambulatory Visit: Payer: 59 | Admitting: Diagnostic Neuroimaging

## 2013-08-10 ENCOUNTER — Ambulatory Visit (INDEPENDENT_AMBULATORY_CARE_PROVIDER_SITE_OTHER): Payer: 59 | Admitting: Diagnostic Neuroimaging

## 2013-08-10 ENCOUNTER — Encounter (INDEPENDENT_AMBULATORY_CARE_PROVIDER_SITE_OTHER): Payer: Self-pay

## 2013-08-10 ENCOUNTER — Encounter: Payer: Self-pay | Admitting: Diagnostic Neuroimaging

## 2013-08-10 VITALS — BP 127/77 | HR 78 | Ht 66.0 in | Wt 226.0 lb

## 2013-08-10 DIAGNOSIS — R569 Unspecified convulsions: Secondary | ICD-10-CM

## 2013-08-10 DIAGNOSIS — F410 Panic disorder [episodic paroxysmal anxiety] without agoraphobia: Secondary | ICD-10-CM

## 2013-08-10 NOTE — Progress Notes (Signed)
[facility]  PATIENT: Hailey Owen DOB: 06/11/1970  REFERRING CLINICIAN: Fusco HISTORY FROM: patient and mother REASON FOR VISIT: new consult (existing patient)   HISTORICAL  CHIEF COMPLAINT:  Chief Complaint  Patient presents with  . Dizziness    HISTORY OF PRESENT ILLNESS:   UPDATE 08/10/13: Since last visit, patient had normal MRI and EEG. However patient continued to have problems with panic attacks and anxiety attacks. She also had intermittent episodes of "seizure" consisting of eyes rolling back, whole-body stiffness, postictal confusion and tiredness. Last event occurred on 07/19/2013. Since that time patient's psychiatry medications were adjusted and now she is on lamotrigine to help with mood stabilization and possible seizure disorder. Since that time she's been doing well. No further seizure type events. She still has some intermittent panic attacks. Some of these panic episodes are able to be aborted with help from her mother "talking her down".  Patient is still driving. Patient also having significant daytime fatigue and sleepiness. She had prior mild sleep apnea, but did not use his CPAP because of financial constraints.  PRIOR HPI (02/13/12): 43 year old ambidextrous female, history of anxiety, depression, migraine, hypercholesteremia, here for evaluation of possible seizure.  Patient reports history of severe panic attacks and anxiety attacks since 1991. She also had episode of suicidal thoughts and 2006. Previous episode consisted of chest pressure, shortness of breath, feeling hot, numbness and tingling and whole-body. Previously these were controlled by taking a "nerve pill" which appears to be Xanax.  Over past few months, episodes of been increasing in frequency. She's also having stuttering speech, memory problems, tension in her arms and legs during some of these episodes. Last Monday she had a severe episode where she was complaining of  headache, arms and legs tensing up, mild convulsions, lips turning blue, unable to talk. Apparently her face was "twisted" according to her mother. Patient went to the emergency room and was diagnosed with anxiety attack.  REVIEW OF SYSTEMS: Full 14 system review of systems performed and notable only for fatigue blurred vision memory loss headache sleepiness depression anxiety decreased energy allergies.  ALLERGIES: Allergies  Allergen Reactions  . Sulfa Antibiotics Other (See Comments)    REACTION: "Shut down immune system with nausea and vomiting, dehydration" was given for bladder infection, resulted in hospitalization  . Septra [Sulfamethoxazole-Tmp Ds]     HOME MEDICATIONS: Outpatient Prescriptions Prior to Visit  Medication Sig Dispense Refill  . clonazePAM (KLONOPIN) 1 MG tablet Take 1 tablet (1 mg total) by mouth 2 (two) times daily.  60 tablet  0  . DULoxetine (CYMBALTA) 30 MG capsule Take 1 capsule (30 mg total) by mouth 2 (two) times daily.  60 capsule  0  . lamoTRIgine (LAMICTAL) 25 MG tablet Take 1 tab for 1 week and than 2 tab daily  60 tablet  0  . cefdinir (OMNICEF) 300 MG capsule Take 2 capsules (600 mg total) by mouth daily.  28 capsule  0  . estrogens, conjugated, (PREMARIN) 0.3 MG tablet Take 0.3 mg by mouth daily.        No facility-administered medications prior to visit.    PAST MEDICAL HISTORY: Past Medical History  Diagnosis Date  . Anxiety   . Hernia   . GERD (gastroesophageal reflux disease)   . Hyperlipemia   . Panic attack   . Depression     PAST SURGICAL HISTORY: Past Surgical History  Procedure Laterality Date  . Abdominal hysterectomy      FAMILY HISTORY:  Family History  Problem Relation Age of Onset  . Alcohol abuse Father   . Depression Father   . Depression Sister   . Anxiety disorder Sister     SOCIAL HISTORY:  History   Social History  . Marital Status: Divorced    Spouse Name: N/A    Number of Children: 0  . Years of  Education: College   Occupational History  . NURSE SECRETARY [employer]   Social History Main Topics  . Smoking status: Current Every Day Smoker -- 1.00 packs/day for 20 years    Types: Cigarettes  . Smokeless tobacco: Never Used  . Alcohol Use: 0.0 oz/week    6-7 Cans of beer per week     Comment: socially  . Drug Use: No  . Sexual Activity: Yes   Other Topics Concern  . Not on file   Social History Narrative   Patient lives at home with mother.   Caffeine Use:1 cup of coffee daily; 3 sodas weekly     PHYSICAL EXAM  Filed Vitals:   08/10/13 1011 08/10/13 1059 08/10/13 1100  BP:  125/71 127/77  Pulse:  58 78  Height: 5\' 6"  (1.676 m)    Weight: 226 lb (102.513 kg)      Not recorded    Body mass index is 36.49 kg/(m^2).  GENERAL EXAM: Patient is in no distress; well developed, nourished and groomed; neck is supple  CARDIOVASCULAR: Regular rate and rhythm, no murmurs, no carotid bruits  NEUROLOGIC: MENTAL STATUS: awake, alert, oriented to person, place and time, recent and remote memory intact, normal attention and concentration, language fluent, comprehension intact, naming intact, fund of knowledge appropriate CRANIAL NERVE: no papilledema on fundoscopic exam, pupils equal and reactive to light, visual fields full to confrontation, extraocular muscles intact, no nystagmus, facial sensation and strength symmetric, hearing intact, palate elevates symmetrically, uvula midline, shoulder shrug symmetric, tongue midline. MOTOR: normal bulk and tone, full strength in the BUE, BLE SENSORY: normal and symmetric to light touch, temperature, vibration COORDINATION: finger-nose-finger, fine finger movements normal REFLEXES: deep tendon reflexes present and symmetric GAIT/STATION: narrow based gait; romberg is negative    DIAGNOSTIC DATA (LABS, IMAGING, TESTING) - I reviewed patient records, labs, notes, testing and imaging myself where available.  Lab Results  Component  Value Date   WBC 10.7* 02/04/2012   HGB 15.6* 02/04/2012   HCT 46.0 02/04/2012   MCV 91.6 02/04/2012   PLT 267 02/04/2012      Component Value Date/Time   NA 138 02/04/2012 2011   K 3.8 02/04/2012 2011   CL 98 02/04/2012 2011   CO2 26 02/04/2012 2011   GLUCOSE 96 02/04/2012 2011   BUN 11 02/04/2012 2011   CREATININE 0.69 02/04/2012 2011   CALCIUM 10.2 02/04/2012 2011   PROT 6.9 10/01/2010 0106   ALBUMIN 4.0 10/01/2010 0106   AST 13 10/01/2010 0106   ALT 26 10/01/2010 0106   ALKPHOS 67 10/01/2010 0106   BILITOT 0.2* 10/01/2010 0106   GFRNONAA >90 02/04/2012 2011   GFRAA >90 02/04/2012 2011   No results found for this basename: CHOL, HDL, LDLCALC, LDLDIRECT, TRIG, CHOLHDL   No results found for this basename: HGBA1C   No results found for this basename: VITAMINB12   No results found for this basename: TSH    02/19/12 EEG - normal  02/27/12 MRI brain - few tiny nonspecific right frontal subcortical white matter hyperintensities with the differential discussed above. These appear new compared with previous MRI films  from 08/02/2007 though differences in strength of the 2 scans and slice thickness and levels may account for this.   ASSESSMENT AND PLAN  43 y.o. year old female here with long history of anxiety and panic attacks, with intermittent episodes of possible seizures. Some of these are able to be terminated with help from her mother "talking her down", but others are not able to be modified. Differential diagnosis includes nonepileptic spells versus epileptic seizures. Another possibility is that patient has both epileptic and nonepileptic spells.  PLAN: - Video EEG for definitive diagnosis [facility] - continue current meds; agree with slowly increasing lamotrigine per Dr. Lolly MustacheArfeen  Orders Placed This Encounter  Procedures  . Ambulatory referral to Neurology   Return in about 3 months (around 11/09/2013).    Suanne MarkerVIKRAM R. PENUMALLI, MD 08/10/2013, 12:02 PM Certified in  Neurology, Neurophysiology and Neuroimaging  [facility] [address], [address] [place], [place] [id number] [phone]

## 2013-08-14 ENCOUNTER — Ambulatory Visit (HOSPITAL_COMMUNITY): Payer: Self-pay | Admitting: Psychiatry

## 2013-08-20 ENCOUNTER — Other Ambulatory Visit (HOSPITAL_COMMUNITY): Payer: Self-pay | Admitting: Psychiatry

## 2013-08-28 ENCOUNTER — Ambulatory Visit (HOSPITAL_COMMUNITY): Payer: Self-pay | Admitting: Psychiatry

## 2013-08-31 ENCOUNTER — Encounter (HOSPITAL_COMMUNITY): Payer: Self-pay | Admitting: Psychiatry

## 2013-08-31 ENCOUNTER — Ambulatory Visit (INDEPENDENT_AMBULATORY_CARE_PROVIDER_SITE_OTHER): Payer: 59 | Admitting: Psychiatry

## 2013-08-31 ENCOUNTER — Encounter (INDEPENDENT_AMBULATORY_CARE_PROVIDER_SITE_OTHER): Payer: Self-pay

## 2013-08-31 VITALS — BP 148/81 | HR 73 | Ht 68.0 in | Wt 224.0 lb

## 2013-08-31 DIAGNOSIS — F329 Major depressive disorder, single episode, unspecified: Secondary | ICD-10-CM

## 2013-08-31 DIAGNOSIS — F339 Major depressive disorder, recurrent, unspecified: Secondary | ICD-10-CM

## 2013-08-31 MED ORDER — FLUOXETINE HCL 20 MG PO CAPS: ORAL_CAPSULE | ORAL | Status: DC
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
## 2013-08-31 NOTE — Patient Instructions (Signed)
Reduced Cymbalta 30 mg for only one week and than stop. Start Prozac 20 mg daily for 1 week and than 2 daily.  Please contact your PCP for management of her blood pressure medication and dizziness. You are taking two medication for blood pressure. Continue Lamictal at present dose.

## 2013-09-25 ENCOUNTER — Other Ambulatory Visit (HOSPITAL_COMMUNITY): Payer: Self-pay | Admitting: Psychiatry

## 2013-09-28 ENCOUNTER — Telehealth (HOSPITAL_COMMUNITY): Payer: Self-pay

## 2013-09-28 ENCOUNTER — Other Ambulatory Visit (HOSPITAL_COMMUNITY): Payer: Self-pay | Admitting: Psychiatry

## 2013-10-07 ENCOUNTER — Other Ambulatory Visit (HOSPITAL_COMMUNITY): Payer: Self-pay | Admitting: *Deleted

## 2013-10-07 DIAGNOSIS — F329 Major depressive disorder, single episode, unspecified: Secondary | ICD-10-CM

## 2013-10-07 MED ORDER — FLUOXETINE HCL 20 MG PO CAPS: ORAL_CAPSULE | ORAL | Status: DC

## 2013-10-08 ENCOUNTER — Other Ambulatory Visit (HOSPITAL_COMMUNITY): Payer: Self-pay | Admitting: Psychiatry

## 2013-10-09 ENCOUNTER — Ambulatory Visit (HOSPITAL_COMMUNITY): Payer: Self-pay | Admitting: Psychiatry

## 2013-10-13 ENCOUNTER — Ambulatory Visit (INDEPENDENT_AMBULATORY_CARE_PROVIDER_SITE_OTHER): Payer: 59 | Admitting: Psychiatry

## 2013-10-13 ENCOUNTER — Encounter (HOSPITAL_COMMUNITY): Payer: Self-pay | Admitting: Psychiatry

## 2013-10-13 VITALS — BP 121/67 | HR 93 | Ht 68.0 in | Wt 225.8 lb

## 2013-10-13 DIAGNOSIS — F339 Major depressive disorder, recurrent, unspecified: Secondary | ICD-10-CM

## 2013-10-13 DIAGNOSIS — F331 Major depressive disorder, recurrent, moderate: Secondary | ICD-10-CM

## 2013-10-13 MED ORDER — CLONAZEPAM 1 MG PO TABS: ORAL_TABLET | ORAL | Status: DC

## 2013-10-13 MED ORDER — LAMOTRIGINE 25 MG PO TABS: ORAL_TABLET | ORAL | Status: DC

## 2013-10-13 MED ORDER — FLUOXETINE HCL 20 MG PO CAPS: ORAL_CAPSULE | ORAL | Status: DC
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
## 2013-10-30 ENCOUNTER — Encounter: Payer: Self-pay | Admitting: Diagnostic Neuroimaging

## 2013-11-04 ENCOUNTER — Telehealth: Payer: Self-pay

## 2013-11-04 NOTE — Telephone Encounter (Signed)
Patient says her Video EEG scheduled w/ [facility] had to be rescheduled for a later date because of machine technical difficulties. I verified w/ Sandy @ [facility] epilepsy monitoring unit Sept is the earliest patient can be seen now. Patient is on wait list also. Advised patient to schedule f/u appt as soon as she knows video EEG date and time maybe 1-2 weeks after or when results available. Patient agreed.

## 2013-11-06 ENCOUNTER — Telehealth: Payer: Self-pay | Admitting: Diagnostic Neuroimaging

## 2013-11-06 NOTE — Telephone Encounter (Signed)
Patient requesting to have [facility] at [facility].  There's an out of pocket expense she's responsible for prior to having EMU at [facility], not able to pay at this time.  She's an [employer] employee.  Please call and advise.

## 2013-11-06 NOTE — Telephone Encounter (Signed)
Spoke to mother and patient. Mother had already called [facility] and confirmed that video EEG is unable to be completed in network to accommodate patient's [facility] insurance plan. Mother stated she will do what she has to do to help daughter with financial needs. Pt will have video EEG done next week 11/09/13-11/11/13. They plan to work closely with [facility] financial dept for assistance with cost.

## 2013-11-06 NOTE — Telephone Encounter (Signed)
Pt's information was given to [place], CMA, Dr. Richrd HumblesPenumalli's assistant to advise.

## 2013-11-10 ENCOUNTER — Ambulatory Visit: Payer: 59 | Admitting: Diagnostic Neuroimaging

## 2013-11-12 ENCOUNTER — Telehealth: Payer: Self-pay | Admitting: *Deleted

## 2013-11-12 ENCOUNTER — Ambulatory Visit (INDEPENDENT_AMBULATORY_CARE_PROVIDER_SITE_OTHER): Payer: 59 | Admitting: Psychiatry

## 2013-11-12 ENCOUNTER — Encounter (HOSPITAL_COMMUNITY): Payer: Self-pay | Admitting: Psychiatry

## 2013-11-12 VITALS — BP 132/71 | HR 89 | Ht 68.0 in | Wt 225.8 lb

## 2013-11-12 DIAGNOSIS — R51 Headache: Secondary | ICD-10-CM

## 2013-11-12 DIAGNOSIS — R519 Headache, unspecified: Secondary | ICD-10-CM | POA: Insufficient documentation

## 2013-11-12 DIAGNOSIS — R569 Unspecified convulsions: Secondary | ICD-10-CM | POA: Insufficient documentation

## 2013-11-12 DIAGNOSIS — F410 Panic disorder [episodic paroxysmal anxiety] without agoraphobia: Secondary | ICD-10-CM | POA: Insufficient documentation

## 2013-11-12 DIAGNOSIS — G8929 Other chronic pain: Secondary | ICD-10-CM | POA: Insufficient documentation

## 2013-11-12 DIAGNOSIS — F331 Major depressive disorder, recurrent, moderate: Secondary | ICD-10-CM

## 2013-11-12 MED ORDER — LAMOTRIGINE 25 MG PO TABS: ORAL_TABLET | ORAL | Status: DC

## 2013-11-12 MED ORDER — CLONAZEPAM 1 MG PO TABS: 1.0000 mg | ORAL_TABLET | Freq: Three times a day (TID) | ORAL | Status: DC | PRN
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
## 2013-11-12 NOTE — Telephone Encounter (Signed)
Dr. Tera HelperBoggs  [phone] calling re: pts VEEG testing.  Had 2 typical spells.  Non-epileptic in nature (panic attacks) Pt made aware.  Seeing psychiatrist today.  Pt having inefficient sleep (morning headaches).  Had 5 yr ago sleep study, there was insurance issues relating to CPAP , but this may have changed now.  Formal report to come.

## 2013-11-16 ENCOUNTER — Other Ambulatory Visit (HOSPITAL_COMMUNITY): Payer: Self-pay | Admitting: Psychiatry

## 2013-11-16 ENCOUNTER — Telehealth (HOSPITAL_COMMUNITY): Payer: Self-pay

## 2013-11-16 NOTE — Telephone Encounter (Signed)
11/16/13 Patient came into the office with ADA forms to be completed - the forms were put into the provider (Dr. Lolly Owen) box.Marland Kitchen.Hailey Owen/sh

## 2013-11-24 ENCOUNTER — Ambulatory Visit (HOSPITAL_COMMUNITY): Payer: Self-pay | Admitting: Psychiatry

## 2013-11-26 ENCOUNTER — Ambulatory Visit (INDEPENDENT_AMBULATORY_CARE_PROVIDER_SITE_OTHER): Payer: 59 | Admitting: Psychiatry

## 2013-11-26 ENCOUNTER — Encounter (HOSPITAL_COMMUNITY): Payer: Self-pay | Admitting: Psychiatry

## 2013-11-26 VITALS — BP 126/80 | HR 86 | Ht 66.61 in | Wt 222.4 lb

## 2013-11-26 DIAGNOSIS — F331 Major depressive disorder, recurrent, moderate: Secondary | ICD-10-CM

## 2013-11-26 MED ORDER — LAMOTRIGINE 100 MG PO TABS: 100.0000 mg | ORAL_TABLET | Freq: Every day | ORAL | Status: DC

## 2013-11-26 MED ORDER — DESVENLAFAXINE SUCCINATE ER 100 MG PO TB24: 100.0000 mg | ORAL_TABLET | Freq: Every day | ORAL | Status: DC
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
## 2013-11-30 ENCOUNTER — Telehealth (HOSPITAL_COMMUNITY): Payer: Self-pay

## 2013-12-03 ENCOUNTER — Encounter (HOSPITAL_COMMUNITY): Payer: Self-pay

## 2013-12-03 ENCOUNTER — Other Ambulatory Visit (HOSPITAL_COMMUNITY): Payer: 59 | Admitting: Psychiatry

## 2013-12-03 DIAGNOSIS — G4700 Insomnia, unspecified: Secondary | ICD-10-CM | POA: Insufficient documentation

## 2013-12-03 DIAGNOSIS — Z609 Problem related to social environment, unspecified: Secondary | ICD-10-CM | POA: Insufficient documentation

## 2013-12-03 DIAGNOSIS — F332 Major depressive disorder, recurrent severe without psychotic features: Secondary | ICD-10-CM | POA: Insufficient documentation

## 2013-12-03 DIAGNOSIS — K219 Gastro-esophageal reflux disease without esophagitis: Secondary | ICD-10-CM | POA: Diagnosis not present

## 2013-12-03 DIAGNOSIS — F411 Generalized anxiety disorder: Secondary | ICD-10-CM | POA: Diagnosis not present

## 2013-12-03 DIAGNOSIS — Z598 Other problems related to housing and economic circumstances: Secondary | ICD-10-CM | POA: Insufficient documentation

## 2013-12-03 DIAGNOSIS — F410 Panic disorder [episodic paroxysmal anxiety] without agoraphobia: Secondary | ICD-10-CM | POA: Diagnosis not present

## 2013-12-03 DIAGNOSIS — E785 Hyperlipidemia, unspecified: Secondary | ICD-10-CM | POA: Diagnosis not present

## 2013-12-03 DIAGNOSIS — Z5987 Material hardship due to limited financial resources, not elsewhere classified: Secondary | ICD-10-CM | POA: Insufficient documentation
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
## 2013-12-03 NOTE — Progress Notes (Signed)
    Daily Group Progress Note  Program: IOP  Group Time: 9:00-10:30  Participation Level: Active  Behavioral Response: Appropriate  Type of Therapy:  Group Therapy  Summary of Progress: Pt. Met with case manager and psychiatrist.     Group Time: 10:30-12:00  Participation Level:  Active  Behavioral Response: Appropriate  Type of Therapy: Psycho-education Group  Summary of Progress: Pt. Participated in discussion about shame and development of vulnerability in order to develop resilience to shame. Pt. Watched [employer] video.   Nancie Neas, COUNS

## 2013-12-04 ENCOUNTER — Encounter (HOSPITAL_COMMUNITY): Payer: Self-pay | Admitting: Psychiatry

## 2013-12-04 ENCOUNTER — Other Ambulatory Visit (HOSPITAL_COMMUNITY): Payer: 59 | Attending: Psychiatry | Admitting: Psychiatry

## 2013-12-04 DIAGNOSIS — F331 Major depressive disorder, recurrent, moderate: Secondary | ICD-10-CM

## 2013-12-04 DIAGNOSIS — F332 Major depressive disorder, recurrent severe without psychotic features: Secondary | ICD-10-CM | POA: Diagnosis not present
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
## 2013-12-04 NOTE — Progress Notes (Signed)
    Daily Group Progress Note  Program: IOP  Group Time: 9:00-10:30  Participation Level: Active  Behavioral Response: Appropriate  Type of Therapy:  Group Therapy  Summary of Progress: Pt. Reported that she continues to be angry with herself about financial decisions that she made in her marriage.      Group Time: 10:30-12:00  Participation Level:  Active  Behavioral Response: Appropriate  Type of Therapy: Group Therapy  Summary of Progress: Pt. Participated in discussion about breathing. Pt. Participated in guided meditation/body scan. Pt. Listened to [employer] video.  Shaune PollackBrown, Sisto Granillo B, COUNS

## 2013-12-07 ENCOUNTER — Other Ambulatory Visit (HOSPITAL_COMMUNITY): Payer: 59 | Admitting: Psychiatry

## 2013-12-07 ENCOUNTER — Encounter (HOSPITAL_COMMUNITY): Payer: Self-pay | Admitting: Psychiatry

## 2013-12-07 DIAGNOSIS — F410 Panic disorder [episodic paroxysmal anxiety] without agoraphobia: Secondary | ICD-10-CM

## 2013-12-07 DIAGNOSIS — F331 Major depressive disorder, recurrent, moderate: Secondary | ICD-10-CM

## 2013-12-07 DIAGNOSIS — F332 Major depressive disorder, recurrent severe without psychotic features: Secondary | ICD-10-CM | POA: Diagnosis not present

## 2013-12-07 NOTE — Telephone Encounter (Signed)
Patient left group this a.m., due to severe panic attack.  Met briefly with pt to work on breath work and relaxation skills.  A:  Pt 's mother was called to pick her up.  This writer called and left vm to check on her. 

## 2013-12-07 NOTE — Progress Notes (Signed)
    Daily Group Progress Note  Program: IOP  Group Time: 9:00-10:30  Participation Level: Minimal  Behavioral Response: Appropriate  Type of Therapy:  Group Therapy  Summary of Progress: Pt. Was excused from group due to panic attack.     Group Time: 10:30-12:00  Participation Level:  None  Behavioral Response: Appropriate  Type of Therapy: Psycho-education Group  Summary of Progress: Pt. Absent due to panic attack.  Shaune PollackBrown, Jennifer B, COUNS

## 2013-12-08 ENCOUNTER — Other Ambulatory Visit (HOSPITAL_COMMUNITY): Payer: 59 | Admitting: Psychiatry

## 2013-12-08 DIAGNOSIS — F332 Major depressive disorder, recurrent severe without psychotic features: Secondary | ICD-10-CM | POA: Diagnosis not present

## 2013-12-08 NOTE — Progress Notes (Signed)
Patient ID: Hailey Owen, female   DOB: 06/18/1970, 43 y.o.   MRN: [id number] Patient seen on 12/07/13, secondary to severe anxiety and a panic attack. Patient vomited once, vital signs were normal. Patient called her mother and will be picked up. Patient denies suicidal or homicidal ideation and has no hallucinations or delusions. States she feels better after taking the Klonopin.

## 2013-12-09 ENCOUNTER — Other Ambulatory Visit (HOSPITAL_COMMUNITY): Payer: 59 | Admitting: Psychiatry

## 2013-12-09 ENCOUNTER — Encounter (HOSPITAL_COMMUNITY): Payer: Self-pay | Admitting: Psychiatry

## 2013-12-09 DIAGNOSIS — F331 Major depressive disorder, recurrent, moderate: Secondary | ICD-10-CM

## 2013-12-09 DIAGNOSIS — F332 Major depressive disorder, recurrent severe without psychotic features: Secondary | ICD-10-CM | POA: Diagnosis not present

## 2013-12-09 DIAGNOSIS — F410 Panic disorder [episodic paroxysmal anxiety] without agoraphobia: Secondary | ICD-10-CM

## 2013-12-09 NOTE — Progress Notes (Signed)
    Daily Group Progress Note  Program: IOP  Group Time: 9:00-10:30  Participation Level: Active  Behavioral Response: Appropriate  Type of Therapy:  Group Therapy  Summary of Progress: Pt. Presented as talkative and mildly anxious. Pt. Reported that she had recovered from her panic attack. Pt. Continues to identify her mother as her primary source of support.     Group Time: 10:30-12:00  Participation Level:  Active  Behavioral Response: Appropriate  Type of Therapy: Psycho-education Group  Summary of Progress: Pt. Participated in discussion about developing self-care behaviors.  Shaune PollackBrown, Jennifer B, COUNS

## 2013-12-10 ENCOUNTER — Encounter (HOSPITAL_COMMUNITY): Payer: Self-pay | Admitting: Psychiatry

## 2013-12-10 ENCOUNTER — Other Ambulatory Visit (HOSPITAL_COMMUNITY): Payer: 59 | Admitting: Psychiatry

## 2013-12-10 DIAGNOSIS — F331 Major depressive disorder, recurrent, moderate: Secondary | ICD-10-CM

## 2013-12-10 DIAGNOSIS — F332 Major depressive disorder, recurrent severe without psychotic features: Secondary | ICD-10-CM | POA: Diagnosis not present

## 2013-12-10 DIAGNOSIS — F410 Panic disorder [episodic paroxysmal anxiety] without agoraphobia: Secondary | ICD-10-CM
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
## 2013-12-11 ENCOUNTER — Other Ambulatory Visit (HOSPITAL_COMMUNITY): Payer: 59 | Admitting: Psychiatry

## 2013-12-11 ENCOUNTER — Encounter (HOSPITAL_COMMUNITY): Payer: Self-pay | Admitting: Psychiatry

## 2013-12-11 DIAGNOSIS — F332 Major depressive disorder, recurrent severe without psychotic features: Secondary | ICD-10-CM | POA: Diagnosis not present

## 2013-12-11 MED ORDER — LAMOTRIGINE 100 MG PO TABS: 100.0000 mg | ORAL_TABLET | Freq: Every day | ORAL | Status: DC

## 2013-12-11 NOTE — Progress Notes (Signed)
Patient ID: Hailey Owen, female   DOB: 01/09/1971, 43 y.o.   MRN: [id number] Patient seen face-to-face today, states that she had another panic attack which almost led to oh fainting spell that she woke up and found that she had bitten her tongue patient states that [facility] and this was found to be normal. Patient does see a neurologist and I discussed that she needs to return to the neurologist and have him obtain a 24-hour EEG and an MRI. She stated understanding. Also discussed increasing Lamictal 100 mg every morning and 50 mg at bedtime. Patient stated understanding. Unclear if she is having seizures at this time. Sleep tends to be disturbed, appetite is good mood is anxious no suicidal or homicidal ideation no hallucinations or delusions. Continue medications with the above changes.

## 2013-12-11 NOTE — Progress Notes (Signed)
    Daily Group Progress Note  Program: IOP  Group Time: 9:00-10:30  Participation Level: Active  Behavioral Response: Appropriate  Type of Therapy:  Group Therapy  Summary of Progress: Pt. Appeared relaxed, described self as in "good and mellow" mood. Pt. Reported plans to go swimming and out to dinner with friends over the weekend. Pt. Participated in discussion about stopping rumination.     Group Time: 10:30-12:00  Participation Level:  Active  Behavioral Response: Appropriate  Type of Therapy: Psycho-education Group  Summary of Progress: Pt. Participated in discussion about how to develop self-compassion through 1- kindness to self, 2- shared humanity, and 3-mindfulness. Pt. Watched and discussed Clovia CuffKristin Neff video.  Shaune PollackBrown, Aayan Haskew B, COUNS

## 2013-12-14 ENCOUNTER — Other Ambulatory Visit (HOSPITAL_COMMUNITY): Payer: 59

## 2013-12-15 ENCOUNTER — Ambulatory Visit (HOSPITAL_COMMUNITY): Payer: Self-pay | Admitting: Psychiatry

## 2013-12-15 ENCOUNTER — Other Ambulatory Visit (HOSPITAL_COMMUNITY): Payer: 59

## 2013-12-16 ENCOUNTER — Encounter (HOSPITAL_COMMUNITY): Payer: Self-pay | Admitting: Psychiatry

## 2013-12-16 ENCOUNTER — Other Ambulatory Visit (HOSPITAL_COMMUNITY): Payer: 59 | Admitting: Psychiatry

## 2013-12-16 DIAGNOSIS — F410 Panic disorder [episodic paroxysmal anxiety] without agoraphobia: Secondary | ICD-10-CM

## 2013-12-16 DIAGNOSIS — F331 Major depressive disorder, recurrent, moderate: Secondary | ICD-10-CM

## 2013-12-16 DIAGNOSIS — F332 Major depressive disorder, recurrent severe without psychotic features: Secondary | ICD-10-CM | POA: Diagnosis not present

## 2013-12-16 NOTE — Progress Notes (Signed)
    Daily Group Progress Note  Program: IOP  Group Time: 9:00-10:00  Participation Level: Active  Behavioral Response: Appropriate  Type of Therapy:  Group Therapy  Summary of Progress: Pt. Discussed preparing to leave the group. Pt. Reported that she was doing "ok" and was feeling that it was time for her to return to work. Pt. Participated in discussion about destructive coping behaviors such as isolating and perfectionism.     Group Time: 10:00-11:00  Participation Level:  Active  Behavioral Response: Appropriate  Type of Therapy: Psycho-education Group  Summary of Progress: Pt. Participated in discussion about developing meditation practice and participated in meditation exercise.  Shaune Pollack, COUNS

## 2013-12-17 ENCOUNTER — Other Ambulatory Visit (HOSPITAL_COMMUNITY): Payer: 59 | Admitting: Psychiatry

## 2013-12-17 DIAGNOSIS — F410 Panic disorder [episodic paroxysmal anxiety] without agoraphobia: Secondary | ICD-10-CM

## 2013-12-17 DIAGNOSIS — F331 Major depressive disorder, recurrent, moderate: Secondary | ICD-10-CM

## 2013-12-17 DIAGNOSIS — F332 Major depressive disorder, recurrent severe without psychotic features: Secondary | ICD-10-CM | POA: Diagnosis not present

## 2013-12-18 ENCOUNTER — Other Ambulatory Visit (HOSPITAL_COMMUNITY): Payer: 59 | Admitting: Psychiatry

## 2013-12-18 ENCOUNTER — Encounter (HOSPITAL_COMMUNITY): Payer: Self-pay | Admitting: Psychiatry

## 2013-12-18 DIAGNOSIS — F332 Major depressive disorder, recurrent severe without psychotic features: Secondary | ICD-10-CM | POA: Diagnosis not present

## 2013-12-18 DIAGNOSIS — F331 Major depressive disorder, recurrent, moderate: Secondary | ICD-10-CM

## 2013-12-18 MED ORDER — LAMOTRIGINE 100 MG PO TABS: 100.0000 mg | ORAL_TABLET | Freq: Every day | ORAL | Status: DC

## 2013-12-18 MED ORDER — CLONAZEPAM 1 MG PO TABS: 1.0000 mg | ORAL_TABLET | Freq: Three times a day (TID) | ORAL | Status: DC | PRN

## 2013-12-18 MED ORDER — DESVENLAFAXINE SUCCINATE ER 100 MG PO TB24: 100.0000 mg | ORAL_TABLET | Freq: Every day | ORAL | Status: DC

## 2013-12-18 NOTE — Progress Notes (Signed)
    Daily Group Progress Note  Program: IOP  Group Time: 9:00-10:30  Participation Level: Active  Behavioral Response: Appropriate  Type of Therapy:  Group Therapy  Summary of Progress: Pt. Reported that she was "ok". Pt. Was talkative, smiled and made appropriate eye contact. Pt. Reports pattern of getting more stressed with anticipation of going back to work. Pt. Recognizes that chaotic work environment is her most significant stressor and is afraid that she will not be able to manage her anxiety when she returns to work.     Group Time: 10:30-12:00  Participation Level:  Active  Behavioral Response: Appropriate  Type of Therapy: Psycho-education Group  Summary of Progress: Pt. Was alert and attentive during Genia Del video and participated in discussion about development of authentic self.  Shaune Pollack, COUNS
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
## 2013-12-18 NOTE — Progress Notes (Signed)
    Daily Group Progress Note  Program: IOP  Group Time: 9:00-10:30  Participation Level: Active  Behavioral Response: Appropriate  Type of Therapy:  Group Therapy  Summary of Progress: Pt. Reported that she was feeling "better than yesterday". Pt. Participated in discussion about having her personal boundaries violated by family members who make her feel guilty for saying "no". Pt. Discussed pattern of people pleasing and how it has added to her stress burden.     Group Time: 10:30-12:00  Participation Level:  Active  Behavioral Response: Appropriate  Type of Therapy: Psycho-education Group  Summary of Progress: Pt. Participated in discussion about developing assertiveness by changing body posture.  Shaune Pollack, COUNS

## 2013-12-21 ENCOUNTER — Encounter (HOSPITAL_COMMUNITY): Payer: Self-pay | Admitting: Psychiatry

## 2013-12-21 ENCOUNTER — Other Ambulatory Visit (HOSPITAL_COMMUNITY): Payer: 59 | Admitting: Psychiatry

## 2013-12-21 DIAGNOSIS — F332 Major depressive disorder, recurrent severe without psychotic features: Secondary | ICD-10-CM | POA: Diagnosis not present

## 2013-12-21 DIAGNOSIS — F331 Major depressive disorder, recurrent, moderate: Secondary | ICD-10-CM

## 2013-12-21 DIAGNOSIS — F410 Panic disorder [episodic paroxysmal anxiety] without agoraphobia: Secondary | ICD-10-CM

## 2013-12-21 NOTE — Progress Notes (Signed)
    Daily Group Progress Note  Program: IOP  Group Time: 9:00-10:30  Participation Level: Active  Behavioral Response: Appropriate  Type of Therapy:  Group Therapy  Summary of Progress: Pt. Shared that she was feeling "ok". Pt. Shared that she spent time in the pool over the weekend and was very tired on Saturday and felt that she needed to sleep all day. Pt.processed feelings of inadequacy in relationships and career.     Group Time: 10:30-12:00  Participation Level:  Active  Behavioral Response: Appropriate  Type of Therapy: Psycho-education Group  Summary of Progress: Pt. Participated in grief and loss facilitated by Theda Belfast.  Shaune Pollack, COUNS

## 2013-12-22 ENCOUNTER — Other Ambulatory Visit (HOSPITAL_COMMUNITY): Payer: 59 | Attending: Psychiatry

## 2013-12-22 DIAGNOSIS — Z5987 Material hardship due to limited financial resources, not elsewhere classified: Secondary | ICD-10-CM | POA: Insufficient documentation

## 2013-12-22 DIAGNOSIS — F332 Major depressive disorder, recurrent severe without psychotic features: Secondary | ICD-10-CM | POA: Insufficient documentation

## 2013-12-22 DIAGNOSIS — E785 Hyperlipidemia, unspecified: Secondary | ICD-10-CM | POA: Insufficient documentation

## 2013-12-22 DIAGNOSIS — K219 Gastro-esophageal reflux disease without esophagitis: Secondary | ICD-10-CM | POA: Insufficient documentation

## 2013-12-22 DIAGNOSIS — F410 Panic disorder [episodic paroxysmal anxiety] without agoraphobia: Secondary | ICD-10-CM | POA: Insufficient documentation

## 2013-12-22 DIAGNOSIS — F17200 Nicotine dependence, unspecified, uncomplicated: Secondary | ICD-10-CM | POA: Insufficient documentation

## 2013-12-22 DIAGNOSIS — Z598 Other problems related to housing and economic circumstances: Secondary | ICD-10-CM | POA: Insufficient documentation

## 2013-12-22 DIAGNOSIS — F411 Generalized anxiety disorder: Secondary | ICD-10-CM | POA: Insufficient documentation

## 2013-12-22 DIAGNOSIS — Z609 Problem related to social environment, unspecified: Secondary | ICD-10-CM | POA: Insufficient documentation

## 2013-12-23 ENCOUNTER — Other Ambulatory Visit (HOSPITAL_COMMUNITY): Payer: 59

## 2013-12-24 ENCOUNTER — Other Ambulatory Visit (HOSPITAL_COMMUNITY): Payer: 59 | Admitting: Psychiatry

## 2013-12-25 ENCOUNTER — Encounter (HOSPITAL_COMMUNITY): Payer: Self-pay | Admitting: Psychiatry

## 2013-12-25 ENCOUNTER — Other Ambulatory Visit (HOSPITAL_COMMUNITY): Payer: 59 | Admitting: Psychiatry

## 2013-12-25 DIAGNOSIS — Z598 Other problems related to housing and economic circumstances: Secondary | ICD-10-CM | POA: Diagnosis not present

## 2013-12-25 DIAGNOSIS — E785 Hyperlipidemia, unspecified: Secondary | ICD-10-CM | POA: Diagnosis not present

## 2013-12-25 DIAGNOSIS — F17200 Nicotine dependence, unspecified, uncomplicated: Secondary | ICD-10-CM | POA: Diagnosis not present

## 2013-12-25 DIAGNOSIS — Z609 Problem related to social environment, unspecified: Secondary | ICD-10-CM | POA: Diagnosis not present

## 2013-12-25 DIAGNOSIS — F410 Panic disorder [episodic paroxysmal anxiety] without agoraphobia: Secondary | ICD-10-CM | POA: Diagnosis not present

## 2013-12-25 DIAGNOSIS — F332 Major depressive disorder, recurrent severe without psychotic features: Secondary | ICD-10-CM | POA: Diagnosis not present

## 2013-12-25 DIAGNOSIS — F411 Generalized anxiety disorder: Secondary | ICD-10-CM | POA: Diagnosis not present

## 2013-12-25 DIAGNOSIS — K219 Gastro-esophageal reflux disease without esophagitis: Secondary | ICD-10-CM | POA: Diagnosis not present

## 2013-12-25 DIAGNOSIS — Z5987 Material hardship: Secondary | ICD-10-CM | POA: Diagnosis not present

## 2013-12-25 NOTE — Progress Notes (Signed)
    Daily Group Progress Note  Program: IOP  Group Time: 9:00-10:30  Participation Level: Active  Behavioral Response: Appropriate  Type of Therapy:  Group Therapy  Summary of Progress: Pt. Reported that she has had very bad headache for the last few days and that she was going to follow up with a neurologist. Pt. Discussed increasing anxiety about returning to work and concerns about ability to work again.     Group Time: 10:30-12:00  Participation Level:  Active  Behavioral Response: Appropriate  Type of Therapy: Psycho-education Group  Summary of Progress: Pt. Participated in discussion about developing motivation. Pt. Watched and discussed Mel Robbins video.  Shaune Pollack, COUNS

## 2013-12-29 ENCOUNTER — Encounter (HOSPITAL_COMMUNITY): Payer: Self-pay | Admitting: Psychiatry

## 2013-12-29 ENCOUNTER — Other Ambulatory Visit (HOSPITAL_COMMUNITY): Payer: 59 | Admitting: Psychiatry

## 2013-12-29 DIAGNOSIS — F332 Major depressive disorder, recurrent severe without psychotic features: Secondary | ICD-10-CM | POA: Diagnosis not present

## 2013-12-29 DIAGNOSIS — F331 Major depressive disorder, recurrent, moderate: Secondary | ICD-10-CM

## 2013-12-29 DIAGNOSIS — F410 Panic disorder [episodic paroxysmal anxiety] without agoraphobia: Secondary | ICD-10-CM

## 2013-12-29 NOTE — Progress Notes (Signed)
    Daily Group Progress Note  Program: IOP  Group Time: 9:00-10:30  Participation Level: Active  Behavioral Response: Appropriate  Type of Therapy:  Group Therapy  Summary of Progress: Pt. Reported feeling "ok". Pt. Reported that she has been sleeping a lot, excited that today is her birthday. Pt. Reported that she has been sleeping a lot but was able to celebrate her birthday on yesterday with family, friends, and going swimming. Pt. Prepared for discharge, continued to express concerns about returning to work but accepted the fact that she would have to return at least temporarily.     Group Time: 10:30-12:00  Participation Level:  Active  Behavioral Response: Appropriate  Type of Therapy: Psycho-education Group  Summary of Progress: Pt. Participated in guided visualization activity.  Shaune Pollack, COUNS

## 2013-12-29 NOTE — Patient Instructions (Signed)
Patient completed MH-IOP today.  Will follow up with Dr. Lolly Mustache on 12-30-13 @ 1:45 p.m.  Patient states she will schedule an appointment with Florencia Reasons, LCSW.  Return to work on 01-04-14, without any restrictions.
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
## 2013-12-30 ENCOUNTER — Encounter (HOSPITAL_COMMUNITY): Payer: Self-pay | Admitting: Psychiatry

## 2013-12-30 ENCOUNTER — Other Ambulatory Visit (HOSPITAL_COMMUNITY): Payer: 59

## 2013-12-30 ENCOUNTER — Ambulatory Visit (INDEPENDENT_AMBULATORY_CARE_PROVIDER_SITE_OTHER): Payer: 59 | Admitting: Psychiatry

## 2013-12-30 VITALS — BP 112/73 | HR 96 | Ht 66.61 in | Wt 220.6 lb

## 2013-12-30 DIAGNOSIS — F339 Major depressive disorder, recurrent, unspecified: Secondary | ICD-10-CM

## 2013-12-30 DIAGNOSIS — F331 Major depressive disorder, recurrent, moderate: Secondary | ICD-10-CM

## 2013-12-30 MED ORDER — DESVENLAFAXINE SUCCINATE ER 100 MG PO TB24: 100.0000 mg | ORAL_TABLET | Freq: Every day | ORAL | Status: DC

## 2013-12-30 MED ORDER — CLONAZEPAM 1 MG PO TABS: 1.0000 mg | ORAL_TABLET | Freq: Two times a day (BID) | ORAL | Status: DC | PRN

## 2013-12-30 MED ORDER — LAMOTRIGINE 150 MG PO TABS: 150.0000 mg | ORAL_TABLET | Freq: Every day | ORAL | Status: DC
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
## 2013-12-31 ENCOUNTER — Other Ambulatory Visit (HOSPITAL_COMMUNITY): Payer: 59

## 2014-01-01 ENCOUNTER — Other Ambulatory Visit (HOSPITAL_COMMUNITY): Payer: 59

## 2014-01-04 ENCOUNTER — Telehealth (HOSPITAL_COMMUNITY): Payer: Self-pay | Admitting: *Deleted

## 2014-01-04 ENCOUNTER — Telehealth (HOSPITAL_COMMUNITY): Payer: Self-pay

## 2014-01-04 ENCOUNTER — Other Ambulatory Visit (HOSPITAL_COMMUNITY): Payer: 59

## 2014-01-04 ENCOUNTER — Encounter (HOSPITAL_COMMUNITY): Payer: Self-pay | Admitting: *Deleted

## 2014-01-04 NOTE — Telephone Encounter (Signed)
01/04/14  Patient came and pick-up her letter.Marland KitchenMarguerite Owen

## 2014-01-04 NOTE — Telephone Encounter (Signed)
Patient called - Requested letter stating her return to work date. Informed her MD out of office for remainder of week, however, as her last appointment note states return to work on  01/07/14. Will provide letter signed in his behalf with this date

## 2014-01-05 ENCOUNTER — Other Ambulatory Visit (HOSPITAL_COMMUNITY): Payer: 59

## 2014-01-06 ENCOUNTER — Other Ambulatory Visit (HOSPITAL_COMMUNITY): Payer: 59

## 2014-01-07 ENCOUNTER — Ambulatory Visit (INDEPENDENT_AMBULATORY_CARE_PROVIDER_SITE_OTHER): Payer: 59 | Admitting: Psychiatry

## 2014-01-07 ENCOUNTER — Encounter (HOSPITAL_COMMUNITY): Payer: Self-pay | Admitting: Psychiatry

## 2014-01-07 ENCOUNTER — Other Ambulatory Visit (HOSPITAL_COMMUNITY): Payer: 59

## 2014-01-07 DIAGNOSIS — F331 Major depressive disorder, recurrent, moderate: Secondary | ICD-10-CM

## 2014-01-07 DIAGNOSIS — F410 Panic disorder [episodic paroxysmal anxiety] without agoraphobia: Secondary | ICD-10-CM
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
## 2014-01-07 NOTE — Patient Instructions (Signed)
Discussed orally 

## 2014-01-08 ENCOUNTER — Other Ambulatory Visit (HOSPITAL_COMMUNITY): Payer: 59

## 2014-01-11 ENCOUNTER — Other Ambulatory Visit (HOSPITAL_COMMUNITY): Payer: 59

## 2014-01-12 ENCOUNTER — Ambulatory Visit (HOSPITAL_COMMUNITY): Payer: Self-pay | Admitting: Psychiatry

## 2014-01-12 ENCOUNTER — Other Ambulatory Visit (HOSPITAL_COMMUNITY): Payer: 59

## 2014-01-13 ENCOUNTER — Other Ambulatory Visit (HOSPITAL_COMMUNITY): Payer: 59

## 2014-01-14 ENCOUNTER — Other Ambulatory Visit (HOSPITAL_COMMUNITY): Payer: 59

## 2014-01-15 ENCOUNTER — Telehealth (HOSPITAL_COMMUNITY): Payer: Self-pay

## 2014-01-21 ENCOUNTER — Other Ambulatory Visit (HOSPITAL_COMMUNITY): Payer: Self-pay | Admitting: Internal Medicine

## 2014-01-21 ENCOUNTER — Ambulatory Visit (HOSPITAL_COMMUNITY)
Admission: RE | Admit: 2014-01-21 | Discharge: 2014-01-21 | Disposition: A | Discharge status: Home / Self Care | Payer: 59 | Source: Ambulatory Visit | Attending: Internal Medicine | Admitting: Internal Medicine

## 2014-01-21 DIAGNOSIS — Z1231 Encounter for screening mammogram for malignant neoplasm of breast: Secondary | ICD-10-CM

## 2014-01-29 ENCOUNTER — Ambulatory Visit (HOSPITAL_COMMUNITY): Payer: Self-pay | Admitting: Psychiatry

## 2014-02-01 ENCOUNTER — Telehealth (HOSPITAL_COMMUNITY): Payer: Self-pay | Admitting: *Deleted

## 2014-02-02 ENCOUNTER — Encounter (HOSPITAL_COMMUNITY): Payer: Self-pay | Admitting: Psychiatry

## 2014-02-02 ENCOUNTER — Ambulatory Visit (INDEPENDENT_AMBULATORY_CARE_PROVIDER_SITE_OTHER): Payer: 59 | Admitting: Psychiatry

## 2014-02-02 VITALS — BP 114/71 | HR 99 | Ht 66.0 in | Wt 216.8 lb

## 2014-02-02 DIAGNOSIS — F331 Major depressive disorder, recurrent, moderate: Secondary | ICD-10-CM

## 2014-02-02 DIAGNOSIS — F339 Major depressive disorder, recurrent, unspecified: Secondary | ICD-10-CM

## 2014-02-02 MED ORDER — CLONAZEPAM 1 MG PO TABS: 1.0000 mg | ORAL_TABLET | Freq: Two times a day (BID) | ORAL | Status: DC | PRN

## 2014-02-02 MED ORDER — LAMOTRIGINE 150 MG PO TABS: 150.0000 mg | ORAL_TABLET | Freq: Every day | ORAL | Status: DC

## 2014-02-02 MED ORDER — DESVENLAFAXINE SUCCINATE ER 100 MG PO TB24: 100.0000 mg | ORAL_TABLET | Freq: Every day | ORAL | Status: DC
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
## 2014-02-03 ENCOUNTER — Ambulatory Visit (HOSPITAL_COMMUNITY): Payer: Self-pay | Admitting: Psychiatry

## 2014-02-09 ENCOUNTER — Ambulatory Visit (HOSPITAL_COMMUNITY): Payer: Self-pay | Admitting: Psychiatry

## 2014-02-11 ENCOUNTER — Ambulatory Visit (HOSPITAL_COMMUNITY)
Admission: RE | Admit: 2014-02-11 | Discharge: 2014-02-11 | Disposition: A | Discharge status: Home / Self Care | Payer: 59 | Source: Ambulatory Visit | Attending: Family Medicine | Admitting: Family Medicine

## 2014-02-11 ENCOUNTER — Other Ambulatory Visit (HOSPITAL_COMMUNITY): Payer: Self-pay | Admitting: Family Medicine

## 2014-02-11 DIAGNOSIS — Z720 Tobacco use: Secondary | ICD-10-CM | POA: Insufficient documentation

## 2014-02-11 DIAGNOSIS — R05 Cough: Secondary | ICD-10-CM | POA: Insufficient documentation

## 2014-02-11 DIAGNOSIS — J069 Acute upper respiratory infection, unspecified: Secondary | ICD-10-CM

## 2014-02-11 DIAGNOSIS — R0602 Shortness of breath: Secondary | ICD-10-CM | POA: Diagnosis not present

## 2014-02-24 ENCOUNTER — Ambulatory Visit (INDEPENDENT_AMBULATORY_CARE_PROVIDER_SITE_OTHER): Payer: 59 | Admitting: Psychiatry

## 2014-02-24 ENCOUNTER — Encounter (HOSPITAL_COMMUNITY): Payer: Self-pay | Admitting: Psychiatry

## 2014-02-24 VITALS — BP 138/76 | HR 84 | Ht 68.0 in | Wt 221.8 lb

## 2014-02-24 DIAGNOSIS — F332 Major depressive disorder, recurrent severe without psychotic features: Secondary | ICD-10-CM

## 2014-02-24 DIAGNOSIS — F331 Major depressive disorder, recurrent, moderate: Secondary | ICD-10-CM

## 2014-02-24 MED ORDER — LAMOTRIGINE 200 MG PO TABS: 200.0000 mg | ORAL_TABLET | Freq: Every day | ORAL | Status: DC
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
## 2014-03-25 ENCOUNTER — Telehealth (HOSPITAL_COMMUNITY): Payer: Self-pay

## 2014-03-26 ENCOUNTER — Ambulatory Visit (HOSPITAL_COMMUNITY): Payer: Self-pay | Admitting: Psychiatry

## 2014-04-08 ENCOUNTER — Other Ambulatory Visit (HOSPITAL_COMMUNITY): Payer: Self-pay | Admitting: Psychiatry

## 2014-04-09 NOTE — Telephone Encounter (Signed)
Refill appropriate. Pt given a one time refill but will [facility] as her insurance has run out. She has appointment scheduled for next week to see psychiatrist.

## 2014-05-07 DIAGNOSIS — Z0289 Encounter for other administrative examinations: Secondary | ICD-10-CM

## 2014-05-10 ENCOUNTER — Ambulatory Visit (HOSPITAL_COMMUNITY): Payer: 59 | Admitting: Psychiatry

## 2014-05-21 ENCOUNTER — Telehealth (HOSPITAL_COMMUNITY): Payer: Self-pay | Admitting: *Deleted

## 2014-05-21 NOTE — Telephone Encounter (Signed)
Medical Records call for pick up. LMOM for patient to call office back.

## 2015-07-29 ENCOUNTER — Encounter (HOSPITAL_COMMUNITY): Payer: Self-pay | Admitting: Emergency Medicine

## 2015-07-29 ENCOUNTER — Emergency Department (HOSPITAL_COMMUNITY)
Admission: EM | Admit: 2015-07-29 | Discharge: 2015-07-29 | Disposition: A | Discharge status: Home / Self Care | Payer: 59 | Attending: Emergency Medicine | Admitting: Emergency Medicine

## 2015-07-29 DIAGNOSIS — B349 Viral infection, unspecified: Secondary | ICD-10-CM | POA: Insufficient documentation

## 2015-07-29 DIAGNOSIS — F17210 Nicotine dependence, cigarettes, uncomplicated: Secondary | ICD-10-CM | POA: Insufficient documentation

## 2015-07-29 DIAGNOSIS — E785 Hyperlipidemia, unspecified: Secondary | ICD-10-CM | POA: Insufficient documentation

## 2015-07-29 DIAGNOSIS — Z7982 Long term (current) use of aspirin: Secondary | ICD-10-CM | POA: Insufficient documentation

## 2015-07-29 DIAGNOSIS — Z79899 Other long term (current) drug therapy: Secondary | ICD-10-CM | POA: Insufficient documentation

## 2015-07-29 DIAGNOSIS — F329 Major depressive disorder, single episode, unspecified: Secondary | ICD-10-CM | POA: Insufficient documentation

## 2015-07-29 LAB — CBC WITH DIFFERENTIAL/PLATELET
Basophils Absolute: 0.1 10*3/uL (ref 0.0–0.1)
Basophils Relative: 1 %
Eosinophils Absolute: 0.3 10*3/uL (ref 0.0–0.7)
Eosinophils Relative: 3 %
HCT: 45.5 % (ref 36.0–46.0)
HEMOGLOBIN: 15.4 g/dL — AB (ref 12.0–15.0)
LYMPHS ABS: 2.7 10*3/uL (ref 0.7–4.0)
Lymphocytes Relative: 27 %
MCH: 32.1 pg (ref 26.0–34.0)
MCHC: 33.8 g/dL (ref 30.0–36.0)
MCV: 94.8 fL (ref 78.0–100.0)
Monocytes Absolute: 0.7 10*3/uL (ref 0.1–1.0)
Monocytes Relative: 7 %
NEUTROS PCT: 62 %
Neutro Abs: 6.3 10*3/uL (ref 1.7–7.7)
Platelets: 289 10*3/uL (ref 150–400)
RBC: 4.8 MIL/uL (ref 3.87–5.11)
RDW: 13.2 % (ref 11.5–15.5)
WBC: 9.9 10*3/uL (ref 4.0–10.5)

## 2015-07-29 LAB — URINALYSIS, ROUTINE W REFLEX MICROSCOPIC
BILIRUBIN URINE: NEGATIVE
Glucose, UA: NEGATIVE mg/dL
HGB URINE DIPSTICK: NEGATIVE
Ketones, ur: NEGATIVE mg/dL
Leukocytes, UA: NEGATIVE
NITRITE: NEGATIVE
PH: 6.5 (ref 5.0–8.0)
Protein, ur: NEGATIVE mg/dL
SPECIFIC GRAVITY, URINE: 1.015 (ref 1.005–1.030)

## 2015-07-29 LAB — COMPREHENSIVE METABOLIC PANEL
ALT: 35 U/L (ref 14–54)
AST: 25 U/L (ref 15–41)
Albumin: 4.9 g/dL (ref 3.5–5.0)
Alkaline Phosphatase: 51 U/L (ref 38–126)
Anion gap: 12 (ref 5–15)
BUN: 26 mg/dL — AB (ref 6–20)
CHLORIDE: 100 mmol/L — AB (ref 101–111)
CO2: 22 mmol/L (ref 22–32)
CREATININE: 0.9 mg/dL (ref 0.44–1.00)
Calcium: 8.9 mg/dL (ref 8.9–10.3)
GFR calc Af Amer: >60 mL/min (ref >60)
Glucose, Bld: 108 mg/dL — ABNORMAL HIGH (ref 65–99)
POTASSIUM: 4.1 mmol/L (ref 3.5–5.1)
SODIUM: 134 mmol/L — AB (ref 135–145)
Total Bilirubin: 0.4 mg/dL (ref 0.3–1.2)
Total Protein: 7.8 g/dL (ref 6.5–8.1)

## 2015-07-29 LAB — LIPASE, BLOOD: LIPASE: 30 U/L (ref 11–51)

## 2015-07-29 MED ORDER — ONDANSETRON 4 MG PO TBDP: ORAL_TABLET | ORAL | Status: DC

## 2015-07-29 MED ORDER — ONDANSETRON HCL 4 MG/2ML IJ SOLN
4.0000 mg | Freq: Once | INTRAMUSCULAR | Status: AC
  Administered 2015-07-29: 4 mg via INTRAVENOUS
  Filled 2015-07-29: qty 2

## 2015-07-29 MED ORDER — IBUPROFEN 800 MG PO TABS: 800.0000 mg | ORAL_TABLET | Freq: Three times a day (TID) | ORAL | Status: DC | PRN

## 2015-07-29 MED ORDER — SODIUM CHLORIDE 0.9 % IV BOLUS (SEPSIS)
1000.0000 mL | Freq: Once | INTRAVENOUS | Status: AC
  Administered 2015-07-29: 1000 mL via INTRAVENOUS

## 2015-07-29 MED ORDER — KETOROLAC TROMETHAMINE 30 MG/ML IJ SOLN
30.0000 mg | Freq: Once | INTRAMUSCULAR | Status: AC
  Administered 2015-07-29: 30 mg via INTRAVENOUS
  Filled 2015-07-29: qty 1

## 2015-07-29 NOTE — ED Provider Notes (Signed)
CSN: [id number]     Arrival date & time 07/29/15  1552 History   First MD Initiated Contact with Patient 07/29/15 1657     Chief Complaint  Patient presents with  . Abdominal Pain     (Consider location/radiation/quality/duration/timing/severity/associated sxs/prior Treatment) Patient is a 45 y.o. female presenting with abdominal pain. The history is provided by the patient (Patient complains of abdominal cramping, vomiting and loose stools for 3 days).  Abdominal Pain Pain location:  Generalized Pain quality: aching   Pain radiates to:  Does not radiate Pain severity:  Moderate Onset quality:  Gradual Timing:  Constant Progression:  Waxing and waning Chronicity:  New Associated symptoms: diarrhea and nausea   Associated symptoms: no chest pain, no cough, no fatigue and no hematuria     Past Medical History  Diagnosis Date  . Anxiety   . Hernia   . GERD (gastroesophageal reflux disease)   . Hyperlipemia   . Panic attack   . Depression   . NWGNFAOZ(308.6)    Past Surgical History  Procedure Laterality Date  . Abdominal hysterectomy    . Appendectomy     Family History  Problem Relation Age of Onset  . Alcohol abuse Father   . Depression Father   . Seizures Father   . Depression Sister   . Anxiety disorder Sister    Social History  Substance Use Topics  . Smoking status: Current Every Day Smoker -- 1.00 packs/day for 20 years    Types: Cigarettes  . Smokeless tobacco: Never Used  . Alcohol Use: 1.8 - 2.4 oz/week    3-4 Cans of beer per week     Comment: socially once per month   OB History    No data available     Review of Systems  Constitutional: Negative for appetite change and fatigue.  HENT: Negative for congestion, ear discharge and sinus pressure.   Eyes: Negative for discharge.  Respiratory: Negative for cough.   Cardiovascular: Negative for chest pain.  Gastrointestinal: Positive for nausea, abdominal pain and diarrhea.  Genitourinary: Negative  for frequency and hematuria.  Musculoskeletal: Negative for back pain.  Skin: Negative for rash.  Neurological: Negative for seizures and headaches.  Psychiatric/Behavioral: Negative for hallucinations.      Allergies  Sulfa antibiotics and Septra  Home Medications   Prior to Admission medications   Medication Sig Start Date End Date Taking? Authorizing Provider  acetaminophen (TYLENOL) 500 MG tablet Take 500 mg by mouth every 6 (six) hours as needed for mild pain or moderate pain.   Yes Historical Provider, MD  Aspirin-Salicylamide-Caffeine (BC HEADACHE) 325-95-16 MG TABS Take 1 packet by mouth daily as needed (for pain).   Yes Historical Provider, MD  gabapentin (NEURONTIN) 100 MG capsule Take 100 mg by mouth 4 (four) times daily.   Yes Historical Provider, MD  lamoTRIgine (LAMICTAL) 100 MG tablet Take 250 mg by mouth daily.   Yes Historical Provider, MD  lisinopril-hydrochlorothiazide (PRINZIDE,ZESTORETIC) 20-12.5 MG tablet Take 2 tablets by mouth daily.   Yes Historical Provider, MD  LORazepam (ATIVAN) 0.5 MG tablet Take 0.5 mg by mouth 2 (two) times daily.   Yes Historical Provider, MD  pyridOXINE (VITAMIN B-6) 25 MG tablet Take 25 mg by mouth daily.   Yes Historical Provider, MD  ibuprofen (ADVIL,MOTRIN) 800 MG tablet Take 1 tablet (800 mg total) by mouth every 8 (eight) hours as needed for moderate pain. 07/29/15   Bethann Berkshire, MD  ondansetron (ZOFRAN ODT) 4 MG disintegrating tablet  4mg  ODT q4 hours prn nausea/vomit 07/29/15   Bethann BerkshireJoseph Ezmeralda Stefanick, MD   BP 114/91 mmHg  Pulse 67  Temp(Src) 97.6 F (36.4 C) (Oral)  Resp 14  Ht 5\' 8"  (1.727 m)  Wt 213 lb (96.616 kg)  BMI 32.39 kg/m2  SpO2 100% Physical Exam  Constitutional: She is oriented to person, place, and time. She appears well-developed.  HENT:  Head: Normocephalic.  Eyes: Conjunctivae and EOM are normal. No scleral icterus.  Neck: Neck supple. No thyromegaly present.  Cardiovascular: Normal rate and regular rhythm.  Exam  reveals no gallop and no friction rub.   No murmur heard. Pulmonary/Chest: No stridor. She has no wheezes. She has no rales. She exhibits no tenderness.  Abdominal: She exhibits no distension. There is no tenderness. There is no rebound.  Musculoskeletal: Normal range of motion. She exhibits no edema.  Lymphadenopathy:    She has no cervical adenopathy.  Neurological: She is oriented to person, place, and time. She exhibits normal muscle tone. Coordination normal.  Skin: No rash noted. No erythema.  Psychiatric: She has a normal mood and affect. Her behavior is normal.    ED Course  Procedures (including critical care time) Labs Review Labs Reviewed  COMPREHENSIVE METABOLIC PANEL - Abnormal; Notable for the following:    Sodium 134 (*)    Chloride 100 (*)    Glucose, Bld 108 (*)    BUN 26 (*)    All other components within normal limits  CBC WITH DIFFERENTIAL/PLATELET - Abnormal; Notable for the following:    Hemoglobin 15.4 (*)    All other components within normal limits  LIPASE, BLOOD  URINALYSIS, ROUTINE W REFLEX MICROSCOPIC (NOT AT [facility])    Imaging Review No results found. I have personally reviewed and evaluated these images and lab results as part of my medical decision-making.   EKG Interpretation None      MDM   Final diagnoses:  Viral syndrome    Labs unremarkable. Suspect symptoms are related to a virus. Patient given Motrin and Zofran will follow-up with PCP    Bethann BerkshireJoseph Lajarvis Italiano, MD 07/29/15 1900

## 2015-07-29 NOTE — Discharge Instructions (Signed)
Drink plenty of fluids and follow up next week with your md

## 2015-07-29 NOTE — ED Notes (Addendum)
PT c/o all over abdominal pain and tenderness with lower back pain and nasal congestion with nausea and vomiting x1 week. PT was sent from urgent care to r/o pyelonephritis per faxed paper work. PT states normal BM yesterday. PT also states increased anxiety with frequent panic attacks and states she had one at urgent care prior to ED arrival and now has a headache that is common for her after a panic attack.

## 2015-07-29 NOTE — ED Notes (Signed)
Patient given discharge instruction, verbalized understand. IV removed, band aid applied. Patient ambulatory out of the department.  

## 2015-08-25 ENCOUNTER — Ambulatory Visit (HOSPITAL_COMMUNITY): Payer: Self-pay | Admitting: Psychiatry

## 2015-10-06 ENCOUNTER — Ambulatory Visit (INDEPENDENT_AMBULATORY_CARE_PROVIDER_SITE_OTHER): Payer: Self-pay | Admitting: Psychiatry

## 2015-10-06 ENCOUNTER — Encounter (HOSPITAL_COMMUNITY): Payer: Self-pay | Admitting: Psychiatry

## 2015-10-06 DIAGNOSIS — F331 Major depressive disorder, recurrent, moderate: Secondary | ICD-10-CM

## 2015-10-06 DIAGNOSIS — F411 Generalized anxiety disorder: Secondary | ICD-10-CM

## 2015-10-06 MED ORDER — CLONAZEPAM 0.5 MG PO TABS: 1.0000 mg | ORAL_TABLET | Freq: Two times a day (BID) | ORAL | Status: DC

## 2015-10-06 MED ORDER — LAMOTRIGINE 150 MG PO TABS: 300.0000 mg | ORAL_TABLET | Freq: Every day | ORAL | Status: DC

## 2015-10-06 MED ORDER — VENLAFAXINE HCL ER 37.5 MG PO CP24
ORAL_CAPSULE | ORAL | Status: DC
Start: 2015-10-06 — End: 2015-11-24
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
## 2015-10-10 ENCOUNTER — Other Ambulatory Visit (HOSPITAL_COMMUNITY): Payer: Self-pay | Admitting: Family Medicine

## 2015-10-10 ENCOUNTER — Ambulatory Visit (HOSPITAL_COMMUNITY)
Admission: RE | Admit: 2015-10-10 | Discharge: 2015-10-10 | Disposition: A | Discharge status: Home / Self Care | Payer: Self-pay | Source: Ambulatory Visit | Attending: Family Medicine | Admitting: Family Medicine

## 2015-10-10 DIAGNOSIS — J209 Acute bronchitis, unspecified: Secondary | ICD-10-CM | POA: Insufficient documentation

## 2015-10-10 DIAGNOSIS — J069 Acute upper respiratory infection, unspecified: Secondary | ICD-10-CM | POA: Insufficient documentation

## 2015-10-20 ENCOUNTER — Telehealth (HOSPITAL_COMMUNITY): Payer: Self-pay | Admitting: *Deleted

## 2015-10-20 NOTE — Telephone Encounter (Signed)
phone call from patient.  She cancelled her appointment with [name] for 11/08/15.   She was told that since she is going through disability, she needs to stay with current provider.

## 2015-10-30 ENCOUNTER — Other Ambulatory Visit (HOSPITAL_COMMUNITY): Payer: Self-pay | Admitting: Psychiatry

## 2015-11-02 ENCOUNTER — Other Ambulatory Visit (HOSPITAL_COMMUNITY): Payer: Self-pay

## 2015-11-02 DIAGNOSIS — F331 Major depressive disorder, recurrent, moderate: Secondary | ICD-10-CM

## 2015-11-02 DIAGNOSIS — F411 Generalized anxiety disorder: Secondary | ICD-10-CM

## 2015-11-02 MED ORDER — CLONAZEPAM 0.5 MG PO TABS: 0.5000 mg | ORAL_TABLET | Freq: Two times a day (BID) | ORAL | Status: DC

## 2015-11-06 ENCOUNTER — Other Ambulatory Visit (HOSPITAL_COMMUNITY): Payer: Self-pay | Admitting: Psychiatry

## 2015-11-08 ENCOUNTER — Ambulatory Visit (HOSPITAL_COMMUNITY): Payer: Self-pay | Admitting: Psychiatry

## 2015-11-16 ENCOUNTER — Ambulatory Visit (HOSPITAL_COMMUNITY): Payer: Self-pay | Admitting: Psychiatry

## 2015-11-24 ENCOUNTER — Ambulatory Visit (INDEPENDENT_AMBULATORY_CARE_PROVIDER_SITE_OTHER): Payer: Self-pay | Admitting: Diagnostic Neuroimaging

## 2015-11-24 ENCOUNTER — Encounter: Payer: Self-pay | Admitting: Diagnostic Neuroimaging

## 2015-11-24 VITALS — BP 100/62 | HR 79

## 2015-11-24 DIAGNOSIS — F410 Panic disorder [episodic paroxysmal anxiety] without agoraphobia: Secondary | ICD-10-CM

## 2015-11-24 DIAGNOSIS — R55 Syncope and collapse: Secondary | ICD-10-CM

## 2015-11-24 DIAGNOSIS — R569 Unspecified convulsions: Secondary | ICD-10-CM
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
## 2015-11-24 NOTE — Progress Notes (Signed)
[facility]  PATIENT: Hailey Owen DOB: June 20, 1970  REFERRING CLINICIAN: Fusco HISTORY FROM: patient and mother REASON FOR VISIT: new consult (existing patient)   HISTORICAL  CHIEF COMPLAINT:  Chief Complaint  Patient presents with  . Seizures    rm 7, last seen 07/2013, mom- Natasha Mead, "July 19- blacked out, had a seizure; unresponsive until I got to hospital; hit my head, ct scan normal    HISTORY OF PRESENT ILLNESS:   UPDATE 11/24/15: Since last visit pt lost to follow up. Had VEEG at [facility] in 2015, had 2 typical spells, and dx'd with non-epileptic spells. Then November 09, 2015, was at home, then started feeling hot, dizzy, faint and lightheaded. Sat down, but then passed out, hit head. Then mother heard her, came out, and found her "snorting", eyes rolled back, limp, pale in color, blue around mouth, faint pulse. 911, paramedics arrived, took pt to [facility] --> BP was 98/54. Was eval'd and started on LEV 500mg  BID. Still having other attacks of panic attacks.   UPDATE 08/10/13: Since last visit, patient had normal MRI and EEG. However patient continued to have problems with panic attacks and anxiety attacks. She also had intermittent episodes of "seizure" consisting of eyes rolling back, whole-body stiffness, postictal confusion and tiredness. Last event occurred on 07/19/2013. Since that time patient's psychiatry medications were adjusted and now she is on lamotrigine to help with mood stabilization and possible seizure disorder. Since that time she's been doing well. No further seizure type events. She still has some intermittent panic attacks. Some of these panic episodes are able to be aborted with help from her mother "talking her down".  Patient is still driving. Patient also having significant daytime fatigue and sleepiness. She had prior mild sleep apnea, but did not use his CPAP because of financial constraints.  PRIOR HPI (02/13/12): 45 year old ambidextrous female,  history of anxiety, depression, migraine, hypercholesteremia, here for evaluation of possible seizure. Patient reports history of severe panic attacks and anxiety attacks since 1991. She also had episode of suicidal thoughts and 2006. Previous episode consisted of chest pressure, shortness of breath, feeling hot, numbness and tingling and whole-body. Previously these were controlled by taking a "nerve pill" which appears to be Xanax. Over past few months, episodes of been increasing in frequency. She's also having stuttering speech, memory problems, tension in her arms and legs during some of these episodes. Last Monday she had a severe episode where she was complaining of headache, arms and legs tensing up, mild convulsions, lips turning blue, unable to talk. Apparently her face was "twisted" according to her mother. Patient went to the emergency room and was diagnosed with anxiety attack.   REVIEW OF SYSTEMS: Full 14 system review of systems performed and negative except for: fatigue blurred vision memory loss headache sleepiness depression anxiety decreased energy allergies hyperactive .  ALLERGIES: Allergies  Allergen Reactions  . Sulfa Antibiotics Other (See Comments)    REACTION: "Shut down immune system with nausea and vomiting, dehydration" was given for bladder infection, resulted in hospitalization  . Septra [Sulfamethoxazole-Trimethoprim]     HOME MEDICATIONS: Outpatient Medications Prior to Visit  Medication Sig Dispense Refill  . clonazePAM (KLONOPIN) 0.5 MG tablet Take 1 tablet (0.5 mg total) by mouth 2 (two) times daily. 60 tablet 0  . estradiol (ESTRACE) 0.5 MG tablet Take 0.5 mg by mouth.    . lamoTRIgine (LAMICTAL) 150 MG tablet TAKE TWO TABLETS BY MOUTH ONCE DAILY 60 tablet 0  . lisinopril-hydrochlorothiazide (PRINZIDE,ZESTORETIC)  20-12.5 MG tablet Take 2 tablets by mouth daily.    Marland Kitchen venlafaxine (EFFEXOR) 37.5 MG tablet TAKE ONE TABLET BY MOUTH ONCE DAILY FOR 7 DAYS THEN TAKE  TWO TABLETS ONCE DAILY 60 tablet 0  . acetaminophen (TYLENOL) 500 MG tablet Take 500 mg by mouth every 6 (six) hours as needed for mild pain or moderate pain.    . Aspirin-Salicylamide-Caffeine (BC HEADACHE) 325-95-16 MG TABS Take 1 packet by mouth daily as needed (for pain).    Marland Kitchen ibuprofen (ADVIL,MOTRIN) 800 MG tablet Take 1 tablet (800 mg total) by mouth every 8 (eight) hours as needed for moderate pain. (Patient not taking: Reported on 11/24/2015) 15 tablet 0  . ondansetron (ZOFRAN ODT) 4 MG disintegrating tablet 4mg  ODT q4 hours prn nausea/vomit (Patient not taking: Reported on 11/24/2015) 12 tablet 0  . pyridOXINE (VITAMIN B-6) 25 MG tablet Take 25 mg by mouth daily.    Marland Kitchen venlafaxine XR (EFFEXOR-XR) 37.5 MG 24 hr capsule Take 1 capsule daily for 1 week than 2 daily 60 capsule 0   No facility-administered medications prior to visit.     PAST MEDICAL HISTORY: Past Medical History:  Diagnosis Date  . Anxiety   . Depression   . GERD (gastroesophageal reflux disease)   . Headache(784.0)   . Hernia   . Hyperlipemia   . Panic attack     PAST SURGICAL HISTORY: Past Surgical History:  Procedure Laterality Date  . ABDOMINAL HYSTERECTOMY    . APPENDECTOMY      FAMILY HISTORY: Family History  Problem Relation Age of Onset  . Alcohol abuse Father   . Depression Father   . Seizures Father   . Depression Sister   . Anxiety disorder Sister   . Atrial fibrillation Mother     SOCIAL HISTORY:  Social History   Social History  . Marital status: Divorced    Spouse name: N/A  . Number of children: 0  . Years of education: College   Occupational History  . 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 Smoking status: Current Every Day Smoker    Packs/day: 1.00    Years: 20.00    Types: Cigarettes  . Smokeless tobacco: Never Used  . Alcohol use 1.8 - 2.4 oz/week    3 - 4 Cans of beer per week     Comment: socially once per month  . Drug use: No  . Sexual  activity: Yes    Birth control/ protection: Surgical   Other Topics Concern  . Not on file   Social History Narrative   Patient lives at home with mother.   Caffeine Use:1 cup of coffee daily; 3 sodas weekly     PHYSICAL EXAM  Vitals:   11/24/15 1544  BP: 100/62  Pulse: 79    Not recorded      There is no height or weight on file to calculate BMI.  GENERAL EXAM: Patient is in no distress; well developed, nourished and groomed; neck is supple  CARDIOVASCULAR: Regular rate and rhythm, no murmurs, no carotid bruits  NEUROLOGIC: MENTAL STATUS: awake, alert, oriented to person, place and time, recent and remote memory intact, normal attention and concentration, language fluent, comprehension intact, naming intact, fund of knowledge appropriate CRANIAL NERVE: no papilledema on fundoscopic exam, pupils equal and reactive to light, visual fields full to confrontation, extraocular muscles intact, no nystagmus, facial sensation and strength symmetric, hearing intact, palate elevates symmetrically, uvula midline, shoulder shrug symmetric, tongue midline.  MOTOR: normal bulk and tone, full strength in the BUE, BLE SENSORY: normal and symmetric to light touch, temperature, vibration COORDINATION: finger-nose-finger, fine finger movements normal REFLEXES: deep tendon reflexes present and symmetric GAIT/STATION: narrow based gait; romberg is negative    DIAGNOSTIC DATA (LABS, IMAGING, TESTING) - I reviewed patient records, labs, notes, testing and imaging myself where available.  Lab Results  Component Value Date   WBC 9.9 07/29/2015   HGB 15.4 (H) 07/29/2015   HCT 45.5 07/29/2015   MCV 94.8 07/29/2015   PLT 289 07/29/2015      Component Value Date/Time   NA 134 (L) 07/29/2015 1710   K 4.1 07/29/2015 1710   CL 100 (L) 07/29/2015 1710   CO2 22 07/29/2015 1710   GLUCOSE 108 (H) 07/29/2015 1710   BUN 26 (H) 07/29/2015 1710   CREATININE 0.90 07/29/2015 1710   CALCIUM 8.9  07/29/2015 1710   PROT 7.8 07/29/2015 1710   ALBUMIN 4.9 07/29/2015 1710   AST 25 07/29/2015 1710   ALT 35 07/29/2015 1710   ALKPHOS 51 07/29/2015 1710   BILITOT 0.4 07/29/2015 1710   GFRNONAA >60 07/29/2015 1710   GFRAA >60 07/29/2015 1710   No results found for: CHOL No results found for: HGBA1C No results found for: VITAMINB12 No results found for: TSH  02/19/12 EEG - normal  02/27/12 MRI brain - few tiny nonspecific right frontal subcortical white matter hyperintensities with the differential discussed above. These appear new compared with previous MRI films from 08/02/2007 though differences in strength of the 2 scans and slice thickness and levels may account for this.   ASSESSMENT AND PLAN  45 y.o. year old female here with long history of anxiety and panic attacks, with intermittent episodes of possible seizures. Some of these are able to be terminated with help from her mother "talking her down", but others are not able to be modified. Was dx'd with non-epileptic spells in 2015 by VEEG. Now with new spell on 11/09/15 (likely syncope based on description), and ongoing panic attacks.    Dx:  Nonepileptic episode (HCC)  Panic attacks  Syncope and collapse     PLAN: - repeat video EEG for definitive diagnosis (will refer to [facility] where patient had last VEEG for comparison) - follow up with PCP or cardiology for syncope workup - continue psychiatry meds per Dr. Lolly Mustache - stop levetiracetam (patient having side effects and not likely having seizures); reduce to  daily x 1 week then stop - no driving until event free x 6 months, regardless of the cause  Orders Placed This Encounter  Procedures  . Ambulatory referral to Neurology   Return in about 3 months (around 02/24/2016).    Suanne Marker, MD 11/24/2015, 4:11 PM Certified in Neurology, Neurophysiology and Neuroimaging  [facility] [address] [place], [place]  [address] [phone]

## 2015-11-28 ENCOUNTER — Other Ambulatory Visit (HOSPITAL_COMMUNITY): Payer: Self-pay | Admitting: Psychiatry

## 2015-11-28 DIAGNOSIS — F331 Major depressive disorder, recurrent, moderate: Secondary | ICD-10-CM

## 2015-11-28 DIAGNOSIS — F411 Generalized anxiety disorder: Secondary | ICD-10-CM

## 2015-11-30 ENCOUNTER — Other Ambulatory Visit (HOSPITAL_COMMUNITY): Payer: Self-pay

## 2015-11-30 DIAGNOSIS — F411 Generalized anxiety disorder: Secondary | ICD-10-CM

## 2015-11-30 DIAGNOSIS — F331 Major depressive disorder, recurrent, moderate: Secondary | ICD-10-CM

## 2015-11-30 MED ORDER — CLONAZEPAM 0.5 MG PO TABS: 0.5000 mg | ORAL_TABLET | Freq: Two times a day (BID) | ORAL | 0 refills | Status: DC

## 2015-12-01 ENCOUNTER — Other Ambulatory Visit (HOSPITAL_COMMUNITY): Payer: Self-pay | Admitting: Psychiatry

## 2015-12-05 ENCOUNTER — Other Ambulatory Visit (HOSPITAL_COMMUNITY): Payer: Self-pay

## 2015-12-05 MED ORDER — LAMOTRIGINE 150 MG PO TABS: 300.0000 mg | ORAL_TABLET | Freq: Every day | ORAL | 0 refills | Status: DC

## 2015-12-05 MED ORDER — VENLAFAXINE HCL ER 75 MG PO CP24: 75.0000 mg | ORAL_CAPSULE | Freq: Every day | ORAL | 0 refills | Status: DC

## 2015-12-06 ENCOUNTER — Ambulatory Visit: Payer: 59 | Admitting: Diagnostic Neuroimaging

## 2015-12-08 ENCOUNTER — Other Ambulatory Visit (HOSPITAL_COMMUNITY): Payer: Self-pay | Admitting: Psychiatry

## 2016-01-02 ENCOUNTER — Other Ambulatory Visit (HOSPITAL_COMMUNITY): Payer: Self-pay | Admitting: Psychiatry

## 2016-01-02 ENCOUNTER — Other Ambulatory Visit (HOSPITAL_COMMUNITY): Payer: Self-pay

## 2016-01-02 DIAGNOSIS — F411 Generalized anxiety disorder: Secondary | ICD-10-CM

## 2016-01-02 DIAGNOSIS — F331 Major depressive disorder, recurrent, moderate: Secondary | ICD-10-CM

## 2016-01-02 MED ORDER — CLONAZEPAM 0.5 MG PO TABS: 0.5000 mg | ORAL_TABLET | Freq: Two times a day (BID) | ORAL | 0 refills | Status: DC

## 2016-01-05 ENCOUNTER — Telehealth: Payer: Self-pay | Admitting: Diagnostic Neuroimaging

## 2016-01-05 NOTE — Telephone Encounter (Deleted)
Spoke with crystal , in  diagnostic neurology  [phone]

## 2016-01-05 NOTE — Telephone Encounter (Signed)
Pt's mother , Natasha MeadJeri, called in stating [facility] and they told her our office needed to call and make appt. They would not with pt's mother. Please call Natasha MeadJeri to give update. [phone] Thursday's or Friday's are preferable.

## 2016-01-05 NOTE — Telephone Encounter (Signed)
Spoke to [place] at [facility] Telephone [phone]  fax [phone] Andrey CampanileSandy has order and notes with insurance. Andrey CampanileSandy will call patient to schedule her Video EEG. Thanks Annabelle Harmanana

## 2016-01-05 NOTE — Telephone Encounter (Signed)
LVM for [place], scheduler in epilepsy monitoring unit. Requested call back after 12:30 pm.

## 2016-01-07 ENCOUNTER — Other Ambulatory Visit (HOSPITAL_COMMUNITY): Payer: Self-pay | Admitting: Psychiatry

## 2016-01-11 ENCOUNTER — Telehealth: Payer: Self-pay | Admitting: Diagnostic Neuroimaging

## 2016-01-11 NOTE — Telephone Encounter (Signed)
Andrey CampanileSandy called from [facility] [phone] and relayed. [facility] at this time is not scheduling self [facility] . Please advise I can call around and see what I can do but patient may have to travel some.

## 2016-01-12 NOTE — Telephone Encounter (Signed)
pls try other locations. Hailey Owen, [facility], [facility]. -VRP

## 2016-01-12 NOTE — Telephone Encounter (Signed)
Called and spoke to patient today and she is [facility] percent [facility] have all her test done and to be seen. Patient was approved Aug 15 th.  Billing  [facility] is aware of this.  [phone].   Called and left [place] a message with details at [facility].  Telephone - [phone] Fax - [phone].

## 2016-01-13 ENCOUNTER — Ambulatory Visit (HOSPITAL_COMMUNITY): Payer: Self-pay | Admitting: Psychiatry

## 2016-01-16 NOTE — Telephone Encounter (Signed)
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 [facility] her 72- hour ambulatory  EEG Take home . Please ask Dr. Marjory LiesPenumalli if he is ok with this option so I can call Patient to schedule. Advise me ? Sandy  [facility] EEG Telephone [phone]. Thanks Annabelle Harmanana.

## 2016-01-16 NOTE — Telephone Encounter (Signed)
Called [place] at [facility]  and left her a message relaying Dr. Marjory LiesPenumalli is out of the office until Wednesday 01/18/2016. Thanks Annabelle Harmanana.

## 2016-01-18 NOTE — Telephone Encounter (Signed)
Yes ok to setup home VEEG. -VRP

## 2016-01-19 NOTE — Telephone Encounter (Signed)
Noted called and spoke to patient relayed PCP needed to do referral for Cardiology. Patient understood and will call her. PCP.

## 2016-01-19 NOTE — Telephone Encounter (Signed)
Noted called Hailey Owen and relayed please move forward with EEG take home.

## 2016-01-19 NOTE — Telephone Encounter (Signed)
Patient called and relayed that she picks up home EEG 02/14/2016. Patient will where for three days.   Dr. Marjory LiesPenumalli  Patient is wanting a referral for cardiology.

## 2016-01-19 NOTE — Telephone Encounter (Signed)
Hailey Owen,   See my last office note. Patient should follow up with PCP for cardiac workup; or her PCP can setup cardiology consult. Please call pt and let her know.  -VRP
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
## 2016-02-01 ENCOUNTER — Ambulatory Visit (INDEPENDENT_AMBULATORY_CARE_PROVIDER_SITE_OTHER): Payer: Self-pay | Admitting: Psychiatry

## 2016-02-01 VITALS — BP 130/78 | HR 91 | Ht 67.0 in | Wt 212.6 lb

## 2016-02-01 DIAGNOSIS — Z811 Family history of alcohol abuse and dependence: Secondary | ICD-10-CM

## 2016-02-01 DIAGNOSIS — F331 Major depressive disorder, recurrent, moderate: Secondary | ICD-10-CM | POA: Insufficient documentation

## 2016-02-01 DIAGNOSIS — F17210 Nicotine dependence, cigarettes, uncomplicated: Secondary | ICD-10-CM

## 2016-02-01 DIAGNOSIS — Z79899 Other long term (current) drug therapy: Secondary | ICD-10-CM

## 2016-02-01 DIAGNOSIS — Z7982 Long term (current) use of aspirin: Secondary | ICD-10-CM

## 2016-02-01 DIAGNOSIS — Z791 Long term (current) use of non-steroidal anti-inflammatories (NSAID): Secondary | ICD-10-CM

## 2016-02-01 DIAGNOSIS — F411 Generalized anxiety disorder: Secondary | ICD-10-CM

## 2016-02-01 DIAGNOSIS — Z8249 Family history of ischemic heart disease and other diseases of the circulatory system: Secondary | ICD-10-CM

## 2016-02-01 DIAGNOSIS — Z818 Family history of other mental and behavioral disorders: Secondary | ICD-10-CM

## 2016-02-01 MED ORDER — VENLAFAXINE HCL ER 75 MG PO CP24: 75.0000 mg | ORAL_CAPSULE | Freq: Every day | ORAL | 2 refills | Status: DC

## 2016-02-01 MED ORDER — CLONAZEPAM 0.5 MG PO TABS: 0.5000 mg | ORAL_TABLET | Freq: Two times a day (BID) | ORAL | 1 refills | Status: DC

## 2016-02-01 MED ORDER — LAMOTRIGINE 150 MG PO TABS: 300.0000 mg | ORAL_TABLET | Freq: Every day | ORAL | 1 refills | Status: DC

## 2016-02-01 MED ORDER — VENLAFAXINE HCL ER 37.5 MG PO CP24: 37.5000 mg | ORAL_CAPSULE | Freq: Every day | ORAL | 0 refills | Status: DC

## 2016-02-01 NOTE — Patient Instructions (Signed)
1. Increase Effexor 112.5 mg daily for two weeks, then 150 mg daily 2. Continue lamictal 300 mg daily 3. Continue Clonazepam 0.5 mg twice a day 4. Continue to see a therapist 5. Return to clinic in two months

## 2016-02-07 ENCOUNTER — Other Ambulatory Visit (HOSPITAL_COMMUNITY): Payer: Self-pay | Admitting: Psychiatry

## 2016-02-07 DIAGNOSIS — F411 Generalized anxiety disorder: Secondary | ICD-10-CM

## 2016-02-07 DIAGNOSIS — F331 Major depressive disorder, recurrent, moderate: Secondary | ICD-10-CM

## 2016-02-29 ENCOUNTER — Emergency Department (HOSPITAL_COMMUNITY): Payer: Self-pay

## 2016-02-29 ENCOUNTER — Emergency Department (HOSPITAL_COMMUNITY)
Admission: EM | Admit: 2016-02-29 | Discharge: 2016-02-29 | Disposition: A | Discharge status: Home / Self Care | Payer: Self-pay | Attending: Emergency Medicine | Admitting: Emergency Medicine

## 2016-02-29 ENCOUNTER — Encounter (HOSPITAL_COMMUNITY): Payer: Self-pay | Admitting: Emergency Medicine

## 2016-02-29 DIAGNOSIS — Z79899 Other long term (current) drug therapy: Secondary | ICD-10-CM | POA: Insufficient documentation

## 2016-02-29 DIAGNOSIS — Z791 Long term (current) use of non-steroidal anti-inflammatories (NSAID): Secondary | ICD-10-CM | POA: Insufficient documentation

## 2016-02-29 DIAGNOSIS — Z7982 Long term (current) use of aspirin: Secondary | ICD-10-CM | POA: Insufficient documentation

## 2016-02-29 DIAGNOSIS — F17210 Nicotine dependence, cigarettes, uncomplicated: Secondary | ICD-10-CM | POA: Insufficient documentation

## 2016-02-29 DIAGNOSIS — K029 Dental caries, unspecified: Secondary | ICD-10-CM | POA: Insufficient documentation

## 2016-02-29 DIAGNOSIS — K0889 Other specified disorders of teeth and supporting structures: Secondary | ICD-10-CM

## 2016-02-29 DIAGNOSIS — L0390 Cellulitis, unspecified: Secondary | ICD-10-CM | POA: Insufficient documentation

## 2016-02-29 LAB — BASIC METABOLIC PANEL
Anion gap: 12 (ref 5–15)
BUN: 24 mg/dL — ABNORMAL HIGH (ref 6–20)
CHLORIDE: 98 mmol/L — AB (ref 101–111)
CO2: 25 mmol/L (ref 22–32)
CREATININE: 1.08 mg/dL — AB (ref 0.44–1.00)
Calcium: 9.9 mg/dL (ref 8.9–10.3)
GFR calc non Af Amer: >60 mL/min (ref >60)
GLUCOSE: 103 mg/dL — AB (ref 65–99)
Potassium: 4.1 mmol/L (ref 3.5–5.1)
Sodium: 135 mmol/L (ref 135–145)

## 2016-02-29 LAB — CBC WITH DIFFERENTIAL/PLATELET
BASOS ABS: 0 10*3/uL (ref 0.0–0.1)
Basophils Relative: 0 %
Eosinophils Absolute: 0.2 10*3/uL (ref 0.0–0.7)
Eosinophils Relative: 2 %
HEMATOCRIT: 36.6 % (ref 36.0–46.0)
Hemoglobin: 12.2 g/dL (ref 12.0–15.0)
LYMPHS ABS: 2 10*3/uL (ref 0.7–4.0)
LYMPHS PCT: 16 %
MCH: 31.3 pg (ref 26.0–34.0)
MCHC: 33.3 g/dL (ref 30.0–36.0)
MCV: 93.8 fL (ref 78.0–100.0)
MONO ABS: 0.6 10*3/uL (ref 0.1–1.0)
Monocytes Relative: 5 %
NEUTROS ABS: 9.5 10*3/uL — AB (ref 1.7–7.7)
Neutrophils Relative %: 77 %
Platelets: 464 10*3/uL — ABNORMAL HIGH (ref 150–400)
RBC: 3.9 MIL/uL (ref 3.87–5.11)
RDW: 13.1 % (ref 11.5–15.5)
WBC: 12.3 10*3/uL — ABNORMAL HIGH (ref 4.0–10.5)

## 2016-02-29 LAB — LACTIC ACID, PLASMA
Lactic Acid, Venous: 3.3 mmol/L (ref 0.5–1.9)
Lactic Acid, Venous: 0.9 mmol/L (ref 0.5–1.9)

## 2016-02-29 MED ORDER — HYDROCODONE-ACETAMINOPHEN 5-325 MG PO TABS: ORAL_TABLET | ORAL | 0 refills | Status: DC

## 2016-02-29 MED ORDER — ONDANSETRON HCL 4 MG/2ML IJ SOLN
4.0000 mg | INTRAMUSCULAR | Status: AC | PRN
Start: 2016-02-29 — End: 2016-02-29
  Administered 2016-02-29 (×2): 4 mg via INTRAVENOUS
  Filled 2016-02-29 (×2): qty 2

## 2016-02-29 MED ORDER — MORPHINE SULFATE (PF) 4 MG/ML IV SOLN
4.0000 mg | INTRAVENOUS | Status: AC | PRN
  Administered 2016-02-29 (×2): 4 mg via INTRAVENOUS
  Filled 2016-02-29 (×2): qty 1

## 2016-02-29 MED ORDER — SODIUM CHLORIDE 0.9 % IV BOLUS (SEPSIS)
1000.0000 mL | Freq: Once | INTRAVENOUS | Status: AC
  Administered 2016-02-29: 1000 mL via INTRAVENOUS

## 2016-02-29 MED ORDER — CLINDAMYCIN PHOSPHATE 900 MG/50ML IV SOLN
900.0000 mg | Freq: Once | INTRAVENOUS | Status: AC
  Administered 2016-02-29: 900 mg via INTRAVENOUS
  Filled 2016-02-29: qty 50

## 2016-02-29 MED ORDER — CLINDAMYCIN HCL 150 MG PO CAPS: ORAL_CAPSULE | ORAL | 0 refills | Status: DC

## 2016-02-29 MED ORDER — IOPAMIDOL (ISOVUE-300) INJECTION 61%
75.0000 mL | Freq: Once | INTRAVENOUS | Status: AC | PRN
  Administered 2016-02-29: 75 mL via INTRAVENOUS

## 2016-02-29 NOTE — ED Notes (Signed)
Taken to ct. nad

## 2016-02-29 NOTE — ED Provider Notes (Signed)
[facility]-EMERGENCY DEPT Provider Note   CSN: [id number] Arrival date & time: 02/29/16  1105     History   Chief Complaint Chief Complaint  Patient presents with  . Dental Pain    HPI Laray Angerracey Minion is a 45 y.o. female.  HPI  Pt was seen at 1300. Per pt, c/o gradual onset and persistence of constant left lower teeth "pain" for the past 2 weeks. States the pain began after she "got a tooth pulled." Pt has been to her Dentist and OSH several times for same, dx "infection" and prescribed 2 different courses of antibiotics. States she was sent to the ED today for "some IV antibiotics."  Denies fevers, no intra-oral edema, no rash, no dysphagia, no neck pain.     Past Medical History:  Diagnosis Date  . Anxiety   . Depression   . GERD (gastroesophageal reflux disease)   . Headache(784.0)   . Hernia   . Hyperlipemia   . Panic attack     Patient Active Problem List   Diagnosis Date Noted  . Major depressive disorder, recurrent episode, moderate (HCC) 02/01/2016  . Generalized anxiety disorder 02/01/2016  . Panic attack 11/12/2013  . Seizure (HCC) 11/12/2013  . Chronic headache 11/12/2013  . Depression with anxiety 03/11/2012  . Insomnia due to mental disorder 03/11/2012    Past Surgical History:  Procedure Laterality Date  . ABDOMINAL HYSTERECTOMY    . APPENDECTOMY      OB History    No data available       Home Medications    Prior to Admission medications   Medication Sig Start Date End Date Taking? Authorizing Provider  acetaminophen (TYLENOL) 500 MG tablet Take 500 mg by mouth every 6 (six) hours as needed for mild pain or moderate pain.    Historical Provider, MD  Aspirin-Salicylamide-Caffeine (BC HEADACHE) 325-95-16 MG TABS Take 1 packet by mouth daily as needed (for pain).    Historical Provider, MD  clonazePAM (KLONOPIN) 0.5 MG tablet Take 1 tablet (0.5 mg total) by mouth 2 (two) times daily. 02/01/16   Neysa Hottereina Hisada, MD  ibuprofen (ADVIL,MOTRIN) 800 MG tablet  Take 1 tablet (800 mg total) by mouth every 8 (eight) hours as needed for moderate pain. Patient not taking: Reported on 11/24/2015 07/29/15   Bethann BerkshireJoseph Zammit, MD  lamoTRIgine (LAMICTAL) 150 MG tablet Take 2 tablets (300 mg total) by mouth daily. 02/01/16   Neysa Hottereina Hisada, MD  levETIRAcetam (KEPPRA) 500 MG tablet Take 500 mg by mouth 2 (two) times daily.    Historical Provider, MD  lisinopril-hydrochlorothiazide (PRINZIDE,ZESTORETIC) 20-12.5 MG tablet Take 2 tablets by mouth daily.    Historical Provider, MD  Multiple Vitamins-Minerals (MULTIVITAMIN ADULT EXTRA C PO) Take by mouth.    Historical Provider, MD  ondansetron (ZOFRAN ODT) 4 MG disintegrating tablet 4mg  ODT q4 hours prn nausea/vomit Patient not taking: Reported on 11/24/2015 07/29/15   Bethann BerkshireJoseph Zammit, MD  pyridOXINE (VITAMIN B-6) 25 MG tablet Take 25 mg by mouth daily.    Historical Provider, MD  venlafaxine XR (EFFEXOR XR) 37.5 MG 24 hr capsule Take 1 capsule (37.5 mg total) by mouth daily. Take total of 112.5mg  daily for two weeks, then 150 mg daily 02/01/16 01/31/17  Neysa Hottereina Hisada, MD  venlafaxine XR (EFFEXOR-XR) 75 MG 24 hr capsule Take 1 capsule (75 mg total) by mouth daily. Take total of 112.5 mg daily for two weeks, then 150 mg daily 02/01/16   Neysa Hottereina Hisada, MD    Family History Family History  Problem Relation Age of Onset  . Alcohol abuse Father   . Depression Father   . Seizures Father   . Depression Sister   . Anxiety disorder Sister   . Atrial fibrillation Mother     Social History Social History  Substance Use Topics  . Smoking status: Current Every Day Smoker    Packs/day: 1.00    Years: 20.00    Types: Cigarettes  . Smokeless tobacco: Never Used  . Alcohol use 1.8 - 2.4 oz/week    3 - 4 Cans of beer per week     Comment: socially once per month     Allergies   Sulfa antibiotics and Septra [sulfamethoxazole-trimethoprim]   Review of Systems Review of Systems ROS: Statement: All systems negative except as marked  or noted in the HPI; Constitutional: Negative for fever and chills. ; ; Eyes: Negative for eye pain and discharge. ; ; ENMT: Positive for lower left facial swelling, dental caries, dental hygiene poor and toothache. Negative for ear pain, bleeding gums, dental injury, facial deformity, hoarseness, nasal congestion, sinus pressure, sore throat, throat swelling and tongue swollen. ; ; Cardiovascular: Negative for chest pain, palpitations, diaphoresis, dyspnea and peripheral edema. ; ; Respiratory: Negative for cough, wheezing and stridor. ; ; Gastrointestinal: Negative for nausea, vomiting, diarrhea and abdominal pain. ; ; Genitourinary: Negative for dysuria, flank pain and hematuria. ; ; Musculoskeletal: Negative for back pain and neck pain. ; ; Skin: Negative for rash and skin lesion. ; ; Neuro: Negative for headache, lightheadedness and neck stiffness. ;    Physical Exam Updated Vital Signs BP 115/64 (BP Location: Left Arm)   Pulse 84   Temp 98.2 F (36.8 C) (Oral)   Resp 18   Ht 5\' 8"  (1.727 m)   Wt 208 lb (94.3 kg)   SpO2 100%   BMI 31.63 kg/m   Physical Exam 1305: Physical examination: Vital signs and O2 SAT: Reviewed; Constitutional: Well developed, Well nourished, Well hydrated, In no acute distress; Head and Face: Normocephalic, Atraumatic; Eyes: EOMI, PERRL, No scleral icterus; ENMT: Mouth and pharynx normal, Poor dentition, Widespread dental decay, Left TM normal, Right TM normal, Mucous membranes moist, +lower left teeth with dental decay.  +lef lower teeth gingival erythema. No edema, fluctuance, or drainage.  No intra-oral edema. No submandibular or sublingual edema. No hoarse voice, no drooling, no stridor. +trismus. ; Neck: Supple, Full range of motion, No lymphadenopathy; Cardiovascular: Regular rate and rhythm, No gallop; Respiratory: Breath sounds clear & equal bilaterally, No wheezes, Normal respiratory effort/excursion; Chest: Nontender, Movement normal; Extremities: Pulses  normal, No tenderness, No edema; Neuro: AA&Ox3, Major CN grossly intact.  No gross focal motor or sensory deficits in extremities. Climbs on and off stretcher easily by herself. Gait steady.; Skin: Color normal, No rash, No petechiae, Warm, Dry   ED Treatments / Results  Labs (all labs ordered are listed, but only abnormal results are displayed)   EKG  EKG Interpretation None       Radiology   Procedures Procedures (including critical care time)  Medications Ordered in ED Medications  morphine 4 MG/ML injection 4 mg (not administered)  ondansetron (ZOFRAN) injection 4 mg (not administered)     Initial Impression / Assessment and Plan / ED Course  I have reviewed the triage vital signs and the nursing notes.  Pertinent labs & imaging results that were available during my care of the patient were reviewed by me and considered in my medical decision making (see chart for  details).  MDM Reviewed: previous chart, nursing note and vitals Reviewed previous: labs Interpretation: labs and CT scan   Results for orders placed or performed during the hospital encounter of 02/29/16  Basic metabolic panel  Result Value Ref Range   Sodium 135 135 - 145 mmol/L   Potassium 4.1 3.5 - 5.1 mmol/L   Chloride 98 (L) 101 - 111 mmol/L   CO2 25 22 - 32 mmol/L   Glucose, Bld 103 (H) 65 - 99 mg/dL   BUN 24 (H) 6 - 20 mg/dL   Creatinine, Ser 1.61 (H) 0.44 - 1.00 mg/dL   Calcium 9.9 8.9 - 09.6 mg/dL   GFR calc non Af Amer >60 >60 mL/min   GFR calc Af Amer >60 >60 mL/min   Anion gap 12 5 - 15  Lactic acid, plasma  Result Value Ref Range   Lactic Acid, Venous 3.3 (HH) 0.5 - 1.9 mmol/L  Lactic acid, plasma  Result Value Ref Range   Lactic Acid, Venous 0.9 0.5 - 1.9 mmol/L  CBC with Differential  Result Value Ref Range   WBC 12.3 (H) 4.0 - 10.5 K/uL   RBC 3.90 3.87 - 5.11 MIL/uL   Hemoglobin 12.2 12.0 - 15.0 g/dL   HCT 04.5 40.9 - 81.1 %   MCV 93.8 78.0 - 100.0 fL   MCH 31.3 26.0 -  34.0 pg   MCHC 33.3 30.0 - 36.0 g/dL   RDW 91.4 78.2 - 95.6 %   Platelets 464 (H) 150 - 400 K/uL   Neutrophils Relative % 77 %   Neutro Abs 9.5 (H) 1.7 - 7.7 K/uL   Lymphocytes Relative 16 %   Lymphs Abs 2.0 0.7 - 4.0 K/uL   Monocytes Relative 5 %   Monocytes Absolute 0.6 0.1 - 1.0 K/uL   Eosinophils Relative 2 %   Eosinophils Absolute 0.2 0.0 - 0.7 K/uL   Basophils Relative 0 %   Basophils Absolute 0.0 0.0 - 0.1 K/uL   Ct Maxillofacial W Contrast Result Date: 02/29/2016 CLINICAL DATA:  Persistent pain since tooth removal 10 days prior. Lower facial swelling EXAM: CT MAXILLOFACIAL WITH CONTRAST TECHNIQUE: Multidetector CT imaging of the maxillofacial structures was performed with intravenous contrast. Multiplanar CT image reconstructions were also generated. A small metallic BB was placed on the right temple in order to reliably differentiate right from left. CONTRAST:  75mL ISOVUE-300 IOPAMIDOL (ISOVUE-300) INJECTION 61% COMPARISON:  None. FINDINGS: Osseous: There is evidence of prior tooth extraction on the left laterally. There is bony thinning in this area and there is nonspecific soft tissue opacity in this area. There is no bony destruction in this area. There is no air within this soft tissue thickening in this area to indicate frank abscess. Elsewhere, bony structures appear intact. No acute fracture or dislocation. No erosive change or bony destruction. The temporomandibular joint regions appear symmetric and unremarkable. Orbits: The orbits appear symmetric and normal bilaterally. No intraorbital lesion. Sinuses: Paranasal sinuses are clear. There is no air-fluid level. No bony destruction or expansion. Ostiomeatal unit complexes are patent bilaterally. Both nares are patent. Soft tissues: No soft tissue mass or adenopathy is evident. Salivary glands appear symmetric bilaterally. Tongue and tongue base regions appear normal. Visualize pharynx appears normal. There is mild soft tissue  swelling in the left lower face region without edema or evident abscess. There are no enhancing lesions. Limited intracranial: Mastoid air cells are somewhat hypoplastic but clear. The visualized brain parenchyma is unremarkable. IMPRESSION: There is localized soft tissue  prominence in the area of recent tooth extraction on the left without enhancement in this area. No air is seen within this soft tissue prominence to suggest frank abscess. There is bony thinning in this area but no overt bony destruction. Mild soft tissue swelling is noted in the lower face region on the left. No abscess or edema evident in this area or elsewhere. Paranasal sinuses are clear. Ostiomeatal unit complexes are patent bilaterally. Mastoids are hypoplastic but clear bilaterally. Study otherwise unremarkable. Electronically Signed   By: William  WoodBretta Bangruff III M.D.   On: 02/29/2016 14:41    1700:  IVF given for elevated lactic acid with improvement. CT without abscess. IV clindamycin given in ED; rx for same (pt was taking low/incorrect dose previously). Pt has tol PO well while in the ED without N/V. Dx and testing d/w pt.  Questions answered.  Verb understanding, agreeable to d/c home with outpt f/u.  Final Clinical Impressions(s) / ED Diagnoses   Final diagnoses:  None    New Prescriptions New Prescriptions   No medications on file     Samuel JesterKathleen Shondrika Hoque, DO 03/03/16 2342

## 2016-02-29 NOTE — ED Notes (Signed)
CRITICAL VALUE ALERT  Critical value received:  Lactic acid 3.3  Date of notification:  02/29/2016  Time of notification:  1356  Critical value read back:Yes.    Nurse who received alert:  [facility] RN  MD notified (1st page):  Dr. Clarene DukeMcmanus  Time of first page:  1356  MD notified (2nd page):  Time of second page:  Responding MD:  Dr. Clarene DukeMcManus  Time MD responded:  1356

## 2016-02-29 NOTE — ED Triage Notes (Signed)
Pt reports sent from dentist. Pt reports has tooth pulled October 29th. Pt reports has been on x2 oral antibiotics with no improvement in pain or "infection." nad noted. Airway patent.

## 2016-02-29 NOTE — Discharge Instructions (Signed)
Take the prescriptions as directed.  Call your regular medical doctor and your dentist tomorrow to schedule a follow up appointment within the next 24 to 48 hours.  Return to the Emergency Department immediately sooner if worsening.

## 2016-03-02 ENCOUNTER — Ambulatory Visit (INDEPENDENT_AMBULATORY_CARE_PROVIDER_SITE_OTHER): Payer: Self-pay | Admitting: Diagnostic Neuroimaging

## 2016-03-02 ENCOUNTER — Encounter: Payer: Self-pay | Admitting: Diagnostic Neuroimaging

## 2016-03-02 VITALS — BP 118/73 | HR 87 | Ht 68.0 in | Wt 208.4 lb

## 2016-03-02 DIAGNOSIS — R55 Syncope and collapse: Secondary | ICD-10-CM

## 2016-03-02 DIAGNOSIS — F410 Panic disorder [episodic paroxysmal anxiety] without agoraphobia: Secondary | ICD-10-CM

## 2016-03-02 DIAGNOSIS — R569 Unspecified convulsions: Secondary | ICD-10-CM

## 2016-03-02 NOTE — Progress Notes (Signed)
[facility]  PATIENT: Hailey Owen DOB: 1970/11/12  REFERRING CLINICIAN: Fusco HISTORY FROM: patient and mother and cousin REASON FOR VISIT: new consult (existing patient)   HISTORICAL  CHIEF COMPLAINT:  Chief Complaint  Patient presents with  . Rm 7  . Follow-up    EEG results  . Loss of Consciousness    No additional syncopal episodes. Would like to be released to drive. Seen in ER earlier this week d/t dental pain.    HISTORY OF PRESENT ILLNESS:   UPDATE 03/02/16: Since last visit, no more syncope attacks. Had VEEG, which was negative, but no events occurred.   UPDATE 11/24/15: Since last visit pt lost to follow up. Had VEEG at [facility] in 2015, had 2 typical spells, and dx'd with non-epileptic spells. Then November 09, 2015, was at home, then started feeling hot, dizzy, faint and lightheaded. Sat down, but then passed out, hit head. Then mother heard her, came out, and found her "snorting", eyes rolled back, limp, pale in color, blue around mouth, faint pulse. 911, paramedics arrived, took pt to [facility] --> BP was 98/54. Was eval'd and started on LEV 500mg  BID. Still having other attacks of panic attacks.   UPDATE 08/10/13: Since last visit, patient had normal MRI and EEG. However patient continued to have problems with panic attacks and anxiety attacks. She also had intermittent episodes of "seizure" consisting of eyes rolling back, whole-body stiffness, postictal confusion and tiredness. Last event occurred on 07/19/2013. Since that time patient's psychiatry medications were adjusted and now she is on lamotrigine to help with mood stabilization and possible seizure disorder. Since that time she's been doing well. No further seizure type events. She still has some intermittent panic attacks. Some of these panic episodes are able to be aborted with help from her mother "talking her down".  Patient is still driving. Patient also having significant daytime fatigue and  sleepiness. She had prior mild sleep apnea, but did not use his CPAP because of financial constraints.  PRIOR HPI (02/13/12): 45 year old ambidextrous female, history of anxiety, depression, migraine, hypercholesteremia, here for evaluation of possible seizure. Patient reports history of severe panic attacks and anxiety attacks since 1991. She also had episode of suicidal thoughts and 2006. Previous episode consisted of chest pressure, shortness of breath, feeling hot, numbness and tingling and whole-body. Previously these were controlled by taking a "nerve pill" which appears to be Xanax. Over past few months, episodes of been increasing in frequency. She's also having stuttering speech, memory problems, tension in her arms and legs during some of these episodes. Last Monday she had a severe episode where she was complaining of headache, arms and legs tensing up, mild convulsions, lips turning blue, unable to talk. Apparently her face was "twisted" according to her mother. Patient went to the emergency room and was diagnosed with anxiety attack.   REVIEW OF SYSTEMS: Full 14 system review of systems performed and negative except for: fatigue blurred vision memory loss headache sleepiness depression anxiety decreased energy allergies hyperactive .  ALLERGIES: Allergies  Allergen Reactions  . Sulfa Antibiotics Other (See Comments)    REACTION: "Shut down immune system with nausea and vomiting, dehydration" was given for bladder infection, resulted in hospitalization  . Septra [Sulfamethoxazole-Trimethoprim]     HOME MEDICATIONS: Outpatient Medications Prior to Visit  Medication Sig Dispense Refill  . acetaminophen (TYLENOL) 500 MG tablet Take 500 mg by mouth every 6 (six) hours as needed for mild pain or moderate pain.    Marland Kitchen  Aspirin-Salicylamide-Caffeine (BC HEADACHE) 325-95-16 MG TABS Take 1 packet by mouth daily as needed (for pain).    Marland Kitchen. BLACK COHOSH EXTRACT PO Take 2 tablets by mouth daily.       . clindamycin (CLEOCIN) 150 MG capsule 3 tabs PO TID x 10 days 90 capsule 0  . clonazePAM (KLONOPIN) 0.5 MG tablet Take 1 tablet (0.5 mg total) by mouth 2 (two) times daily. 60 tablet 1  . Flaxseed, Linseed, (FLAX SEEDS PO) Take 1 tablet by mouth daily.    Marland Kitchen. HYDROcodone-acetaminophen (NORCO/VICODIN) 5-325 MG tablet 1 or 2 tabs PO q6 hours prn pain 20 tablet 0  . ibuprofen (ADVIL,MOTRIN) 200 MG tablet Take 800 mg by mouth every 6 (six) hours as needed for moderate pain.    Marland Kitchen. lamoTRIgine (LAMICTAL) 150 MG tablet Take 2 tablets (300 mg total) by mouth daily. (Patient taking differently: Take 150 mg by mouth 2 (two) times daily. ) 60 tablet 1  . lisinopril-hydrochlorothiazide (PRINZIDE,ZESTORETIC) 20-12.5 MG tablet Take 1 tablet by mouth 2 (two) times daily.     Marland Kitchen. venlafaxine XR (EFFEXOR XR) 37.5 MG 24 hr capsule Take 1 capsule (37.5 mg total) by mouth daily. Take total of 112.5mg  daily for two weeks, then 150 mg daily 14 capsule 0  . venlafaxine XR (EFFEXOR-XR) 75 MG 24 hr capsule Take 1 capsule (75 mg total) by mouth daily. Take total of 112.5 mg daily for two weeks, then 150 mg daily (Patient taking differently: Take 150 mg by mouth daily. ) 60 capsule 2   No facility-administered medications prior to visit.     PAST MEDICAL HISTORY: Past Medical History:  Diagnosis Date  . Anxiety   . Depression   . GERD (gastroesophageal reflux disease)   . Headache(784.0)   . Hernia   . Hyperlipemia   . Panic attack     PAST SURGICAL HISTORY: Past Surgical History:  Procedure Laterality Date  . ABDOMINAL HYSTERECTOMY    . APPENDECTOMY    . MOUTH SURGERY      FAMILY HISTORY: Family History  Problem Relation Age of Onset  . Alcohol abuse Father   . Depression Father   . Seizures Father   . Depression Sister   . Anxiety disorder Sister   . Atrial fibrillation Mother     SOCIAL HISTORY:  Social History   Social History  . Marital status: Divorced    Spouse name: N/A  . Number of  children: 0  . Years of education: College   Occupational History  . 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 Smoking status: Current Every Day Smoker    Packs/day: 1.00    Years: 20.00    Types: Cigarettes  . Smokeless tobacco: Never Used  . Alcohol use 1.8 - 2.4 oz/week    3 - 4 Cans of beer per week     Comment: socially once per month  . Drug use: No  . Sexual activity: Yes    Birth control/ protection: Surgical   Other Topics Concern  . Not on file   Social History Narrative   Patient lives at home with mother.   Caffeine Use:1 cup of coffee daily; 3 sodas weekly   Right-handed     PHYSICAL EXAM  Vitals:   03/02/16 1135  BP: 118/73  Pulse: 87  Weight: 208 lb 6.4 oz (94.5 kg)  Height: 5\' 8"  (1.727 m)    Not recorded      Body mass index is  31.69 kg/m.  GENERAL EXAM: Patient is in no distress; well developed, nourished and groomed; neck is supple  CARDIOVASCULAR: Regular rate and rhythm, no murmurs, no carotid bruits  NEUROLOGIC: MENTAL STATUS: awake, alert, oriented to person, place and time, recent and remote memory intact, normal attention and concentration, language fluent, comprehension intact, naming intact, fund of knowledge appropriate CRANIAL NERVE: no papilledema on fundoscopic exam, pupils equal and reactive to light, visual fields full to confrontation, extraocular muscles intact, no nystagmus, facial sensation and strength symmetric, hearing intact, palate elevates symmetrically, uvula midline, shoulder shrug symmetric, tongue midline. MOTOR: normal bulk and tone, full strength in the BUE, BLE SENSORY: normal and symmetric to light touch, temperature, vibration COORDINATION: finger-nose-finger, fine finger movements normal REFLEXES: deep tendon reflexes present and symmetric GAIT/STATION: narrow based gait; romberg is negative    DIAGNOSTIC DATA (LABS, IMAGING, TESTING) - I reviewed patient records, labs, notes, testing  and imaging myself where available.  Lab Results  Component Value Date   WBC 12.3 (H) 02/29/2016   HGB 12.2 02/29/2016   HCT 36.6 02/29/2016   MCV 93.8 02/29/2016   PLT 464 (H) 02/29/2016      Component Value Date/Time   NA 135 02/29/2016 1319   K 4.1 02/29/2016 1319   CL 98 (L) 02/29/2016 1319   CO2 25 02/29/2016 1319   GLUCOSE 103 (H) 02/29/2016 1319   BUN 24 (H) 02/29/2016 1319   CREATININE 1.08 (H) 02/29/2016 1319   CALCIUM 9.9 02/29/2016 1319   PROT 7.8 07/29/2015 1710   ALBUMIN 4.9 07/29/2015 1710   AST 25 07/29/2015 1710   ALT 35 07/29/2015 1710   ALKPHOS 51 07/29/2015 1710   BILITOT 0.4 07/29/2015 1710   GFRNONAA >60 02/29/2016 1319   GFRAA >60 02/29/2016 1319   No results found for: CHOL No results found for: HGBA1C No results found for: VITAMINB12 No results found for: TSH  02/19/12 EEG - normal  02/27/12 MRI brain - few tiny nonspecific right frontal subcortical white matter hyperintensities with the differential discussed above. These appear new compared with previous MRI films from 08/02/2007 though differences in strength of the 2 scans and slice thickness and levels may account for this.  02/14/16 - 10/36/17 home EEG: no push button events; no epileptiform discharges; no seizures; poor sleep cycling noted    ASSESSMENT AND PLAN  45 y.o. year old female here with long history of anxiety and panic attacks, with intermittent episodes of possible seizures. Some of these are able to be terminated with help from her mother "talking her down", but others are not able to be modified. Was dx'd with non-epileptic spells in 2015 by VEEG. Now with new spell on 11/09/15 (likely syncope based on description), and ongoing panic attacks. No evidence that these are seizures. Will treat this as unprovoked syncope attack.   Dx:  Syncope and collapse  Nonepileptic episode (HCC)  Panic attacks    PLAN: - refer to cardiology for syncope workup - continue psychiatry  meds per Dr. Lolly MustacheArfeen - no driving until event free x 6 months (last syncope on 11/09/15), regardless of the cause  Return in about 6 months (around 08/30/2016).    Suanne MarkerVIKRAM R. Deisi Salonga, MD 03/02/2016, 12:08 PM Certified in Neurology, Neurophysiology and Neuroimaging  [facility] [address], [address] [place], [place] [id number] [phone]

## 2016-03-09 ENCOUNTER — Encounter: Payer: Self-pay | Admitting: Cardiology

## 2016-03-20 ENCOUNTER — Encounter: Payer: Self-pay | Admitting: Cardiology

## 2016-03-20 ENCOUNTER — Ambulatory Visit (INDEPENDENT_AMBULATORY_CARE_PROVIDER_SITE_OTHER): Payer: Self-pay | Admitting: Cardiology

## 2016-03-20 VITALS — BP 116/72 | HR 89 | Ht 67.0 in | Wt 203.0 lb

## 2016-03-20 DIAGNOSIS — R55 Syncope and collapse: Secondary | ICD-10-CM

## 2016-03-20 NOTE — Progress Notes (Signed)
Electrophysiology Office Note   Date:  03/20/2016   ID:  Hailey Angerracey Frickey, DOB 07/22/1970, MRN [id number]  PCP:  Cassell SmilesFUSCO,LAWRENCE J., MD Primary Electrophysiologist:  Regan LemmingWill Martin Sulma Ruffino, MD    Chief Complaint  Patient presents with  . CONSULT    Syncope     History of Present Illness: Hailey Owen is a 45 y.o. female who presents today for electrophysiology evaluation.   Hx anxiety, depression, panic attacks, and nonepileptic spells. On 10/31/15, had episode of feeling hot, dizzy, faint and lightheaded. Sat down but passed out. Mother found her snorting, eyes rolled back, limp, and pale with a faint pulse.  EMS called, BP 98/54 when arrived at the hospital.     Today, she denies symptoms of palpitations, chest pain, shortness of breath, orthopnea, PND, lower extremity edema, claudication, dizziness, presyncope, syncope, bleeding, or neurologic sequela. The patient is tolerating medications without difficulties and is otherwise without complaint today.  She says that she has not had an episode of syncope since her previous one last summer. She does get episodes of palpitations and shortness of breath when she is having a panic attacks. When she is not having panic attacks, she does not have any chest pain, shortness of breath.   Past Medical History:  Diagnosis Date  . Anxiety   . Depression   . GERD (gastroesophageal reflux disease)   . Headache(784.0)   . Hernia   . Hyperlipemia   . Panic attack    Past Surgical History:  Procedure Laterality Date  . ABDOMINAL HYSTERECTOMY    . APPENDECTOMY    . MOUTH SURGERY       Current Outpatient Prescriptions  Medication Sig Dispense Refill  . BLACK COHOSH EXTRACT PO Take 2 tablets by mouth daily.     . clonazePAM (KLONOPIN) 0.5 MG tablet Take 1 tablet (0.5 mg total) by mouth 2 (two) times daily. 60 tablet 1  . Flaxseed, Linseed, (FLAX SEEDS PO) Take 1 tablet by mouth daily.    Marland Kitchen. ibuprofen (ADVIL,MOTRIN) 200 MG tablet Take 800 mg by  mouth every 6 (six) hours as needed for moderate pain.    Marland Kitchen. lamoTRIgine (LAMICTAL) 150 MG tablet Take 2 tablets (300 mg total) by mouth daily. (Patient taking differently: Take 150 mg by mouth 2 (two) times daily. ) 60 tablet 1  . lisinopril-hydrochlorothiazide (PRINZIDE,ZESTORETIC) 20-12.5 MG tablet Take 1 tablet by mouth 2 (two) times daily.     Marland Kitchen. venlafaxine XR (EFFEXOR XR) 37.5 MG 24 hr capsule Take 1 capsule (37.5 mg total) by mouth daily. Take total of 112.5mg  daily for two weeks, then 150 mg daily 14 capsule 0  . venlafaxine XR (EFFEXOR-XR) 75 MG 24 hr capsule Take 1 capsule (75 mg total) by mouth daily. Take total of 112.5 mg daily for two weeks, then 150 mg daily (Patient taking differently: Take 150 mg by mouth daily. ) 60 capsule 2   No current facility-administered medications for this visit.     Allergies:   Sulfa antibiotics and Septra [sulfamethoxazole-trimethoprim]   Social History:  The patient  reports that she has been smoking Cigarettes.  She has a 20.00 pack-year smoking history. She has never used smokeless tobacco. She reports that she drinks about 1.8 - 2.4 oz of alcohol per week . She reports that she does not use drugs.   Family History:  The patient's family history includes Alcohol abuse in her father; Anxiety disorder in her sister; Atrial fibrillation in her mother; Depression in her father and  sister; Seizures in her father.    ROS:  Please see the history of present illness.   Otherwise, review of systems is positive for Depression, anxiety, dizziness, syncope, headaches.   All other systems are reviewed and negative.    PHYSICAL EXAM: VS:  BP 116/72   Pulse 89   Ht 5\' 7"  (1.702 m)   Wt 203 lb (92.1 kg)   BMI 31.79 kg/m  , BMI Body mass index is 31.79 kg/m. GEN: Well nourished, well developed, in no acute distress  HEENT: normal  Neck: no JVD, carotid bruits, or masses Cardiac: RRR; no murmurs, rubs, or gallops,no edema  Respiratory:  clear to  auscultation bilaterally, normal work of breathing GI: soft, nontender, nondistended, + BS MS: no deformity or atrophy  Skin: warm and dry Neuro:  Strength and sensation are intact Psych: euthymic mood, full affect  EKG:  EKG is ordered today. Personal review of the ekg ordered shows sinus rhythm, rate 89  Recent Labs: 07/29/2015: ALT 35 02/29/2016: BUN 24; Creatinine, Ser 1.08; Hemoglobin 12.2; Platelets 464; Potassium 4.1; Sodium 135    Lipid Panel  No results found for: CHOL, TRIG, HDL, CHOLHDL, VLDL, LDLCALC, LDLDIRECT   Wt Readings from Last 3 Encounters:  03/20/16 203 lb (92.1 kg)  03/02/16 208 lb 6.4 oz (94.5 kg)  02/29/16 208 lb (94.3 kg)      Other studies Reviewed: Additional studies/ records that were reviewed today include: Epic notes   ASSESSMENT AND PLAN:  1.  Syncope: At this time, it is unclear as to the cause of her syncope. It is possibly orthostatic related or related to her anxiety attacks. Her EKG today is normal, and she does not usually have any palpitations. She does not have chest pain, shortness of breath, PND, orthopnea to make me think that she has coronary disease or heart failure. At this time, it is unlikely that she is having a cardiac cause of syncope. That being said, if she does have more episodes of syncope, she may benefit from a heart monitor. It would potentially need to be a Linq monitor as her episodes of syncope have thus far been few and far between.  2. Hypertension: Well-controlled  3. Tobacco abuse: Encouraged cessation  Current medicines are reviewed at length with the patient today.   The patient does not have concerns regarding her medicines.  The following changes were made today:  none  Labs/ tests ordered today include:  No orders of the defined types were placed in this encounter.    Disposition:   FU with Anedra Penafiel PRN  Signed, Sher Hellinger Jorja LoaMartin Gregory Dowe, MD  03/20/2016 2:09 PM     [facility] [address] [place] [address] [phone] (office) [phone] (fax)

## 2016-03-20 NOTE — Patient Instructions (Addendum)
Medication Instructions:  Your physician recommends that you continue on your current medications as directed. Please refer to the Current Medication list given to you today.  * If you need a refill on your cardiac medications before your next appointment, please call your pharmacy.   Labwork: None ordered  Testing/Procedures: None ordered  Follow-Up: No follow up is needed at this time with Dr. Camnitz.  He will see you on an as needed basis.   Thank you for choosing [facility]!!   Melayna Robarts, RN [phone]     

## 2016-04-09 ENCOUNTER — Telehealth (HOSPITAL_COMMUNITY): Payer: Self-pay

## 2016-04-09 DIAGNOSIS — F411 Generalized anxiety disorder: Secondary | ICD-10-CM

## 2016-04-09 DIAGNOSIS — F331 Major depressive disorder, recurrent, moderate: Secondary | ICD-10-CM

## 2016-04-09 MED ORDER — CLONAZEPAM 0.5 MG PO TABS: 0.5000 mg | ORAL_TABLET | Freq: Two times a day (BID) | ORAL | 0 refills | Status: DC

## 2016-04-09 NOTE — Telephone Encounter (Signed)
Yes, thanks

## 2016-04-09 NOTE — Telephone Encounter (Signed)
Medication refill request - Fax refill request received for patient's prescribed Klonopin. Last order provided 02/01/16 + 1 refill and last filled 03/17/16.  Patient returns 05/03/16 to Dr. Lolly MustacheArfeen.

## 2016-04-09 NOTE — Telephone Encounter (Signed)
Medication  refill - called in a one time order for patient's prescribed Klonopin per Dr. Bing MatterHisada's order to be placed on file for when due to be filled.  Called in one time order with Denny PeonErin, [facility] [place], [place].  Requested no early refills per request.

## 2016-05-03 ENCOUNTER — Encounter (HOSPITAL_COMMUNITY): Payer: Self-pay | Admitting: Psychiatry

## 2016-05-03 ENCOUNTER — Ambulatory Visit (INDEPENDENT_AMBULATORY_CARE_PROVIDER_SITE_OTHER): Payer: No Typology Code available for payment source | Admitting: Psychiatry

## 2016-05-03 VITALS — BP 120/76 | HR 96 | Ht 67.0 in | Wt 207.8 lb

## 2016-05-03 DIAGNOSIS — Z9889 Other specified postprocedural states: Secondary | ICD-10-CM

## 2016-05-03 DIAGNOSIS — Z818 Family history of other mental and behavioral disorders: Secondary | ICD-10-CM

## 2016-05-03 DIAGNOSIS — Z811 Family history of alcohol abuse and dependence: Secondary | ICD-10-CM

## 2016-05-03 DIAGNOSIS — Z79899 Other long term (current) drug therapy: Secondary | ICD-10-CM

## 2016-05-03 DIAGNOSIS — F17210 Nicotine dependence, cigarettes, uncomplicated: Secondary | ICD-10-CM

## 2016-05-03 DIAGNOSIS — Z888 Allergy status to other drugs, medicaments and biological substances status: Secondary | ICD-10-CM

## 2016-05-03 DIAGNOSIS — Z882 Allergy status to sulfonamides status: Secondary | ICD-10-CM

## 2016-05-03 DIAGNOSIS — F331 Major depressive disorder, recurrent, moderate: Secondary | ICD-10-CM

## 2016-05-03 MED ORDER — LAMOTRIGINE 150 MG PO TABS: 150.0000 mg | ORAL_TABLET | Freq: Two times a day (BID) | ORAL | 0 refills | Status: DC

## 2016-05-03 MED ORDER — DIAZEPAM 2 MG PO TABS: ORAL_TABLET | ORAL | 0 refills | Status: DC

## 2016-05-03 MED ORDER — VENLAFAXINE HCL ER 150 MG PO CP24: 150.0000 mg | ORAL_CAPSULE | Freq: Every day | ORAL | 0 refills | Status: DC
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
## 2016-05-16 ENCOUNTER — Telehealth (HOSPITAL_COMMUNITY): Payer: Self-pay

## 2016-05-16 NOTE — Telephone Encounter (Signed)
Medication problem - Telephone call with patient after she left a message the Valium she is taking does not help her sleep and is requesting Seroquel or something cheaper to try for sleep as does not currently have insurance.  Patient requests Dr. Celene KrasAfeen look at her medication and to assist with whatever he thinks may help the most that cost the least.  Patient requested order [facility]scribed [facility] [place], [place] and agreed to call patient back once discussed with Dr. Lolly MustacheArfeen.

## 2016-05-17 ENCOUNTER — Other Ambulatory Visit (HOSPITAL_COMMUNITY): Payer: Self-pay

## 2016-05-17 ENCOUNTER — Other Ambulatory Visit (HOSPITAL_COMMUNITY): Payer: Self-pay | Admitting: Psychiatry

## 2016-05-17 MED ORDER — TRAZODONE HCL 100 MG PO TABS: ORAL_TABLET | ORAL | 0 refills | Status: DC

## 2016-05-17 NOTE — Telephone Encounter (Signed)
She can try it trazodone 100 mg half to one tablet as needed.  It would be a cost effective.  If that did not work been we need to try Seroquel which help her in the past but could bel expense

## 2016-05-17 NOTE — Telephone Encounter (Signed)
Medication management - Telephone call with patient to inform Dr. Lolly MustacheArfeen sent in an order for Trazodone 100mg , 1/2 to 1 at bedtime to help with sleep.  Informed this medication should be less expensive and possibly on [facility]'s $4 formulary. Reviewed medication with patient and patient to call back if any problems or if not effective.  Patient to start with a 1/2 pill to see if improves sleep and if not can go up to 1 at night per Dr. Lolly MustacheArfeen instructions.

## 2016-05-17 NOTE — Telephone Encounter (Signed)
Sent the Trazodone to the pharmacy and called the patient to inform her that this was done and to call the office back if there are any questions

## 2016-05-31 ENCOUNTER — Telehealth (HOSPITAL_COMMUNITY): Payer: Self-pay

## 2016-05-31 ENCOUNTER — Other Ambulatory Visit (HOSPITAL_COMMUNITY): Payer: Self-pay

## 2016-05-31 ENCOUNTER — Other Ambulatory Visit (HOSPITAL_COMMUNITY): Payer: Self-pay | Admitting: Psychiatry

## 2016-05-31 DIAGNOSIS — F331 Major depressive disorder, recurrent, moderate: Secondary | ICD-10-CM

## 2016-05-31 MED ORDER — DIAZEPAM 2 MG PO TABS: ORAL_TABLET | ORAL | 0 refills | Status: DC

## 2016-05-31 NOTE — Telephone Encounter (Signed)
She can try trazodone 150 mg at bedtime.

## 2016-05-31 NOTE — Telephone Encounter (Signed)
I called patient and let her know. I told her if she runs out early to call me and I will call in new prescription

## 2016-05-31 NOTE — Telephone Encounter (Signed)
Patient is calling for a refill on her Valium and to let you know that the trazodone is not working for sleep. Patient states she is only getting about 3 hours a night. I called in a one month supply of the valium to the pharmacy, please advise on the trazodone.

## 2016-07-11 ENCOUNTER — Other Ambulatory Visit (HOSPITAL_COMMUNITY): Payer: Self-pay

## 2016-07-11 DIAGNOSIS — F331 Major depressive disorder, recurrent, moderate: Secondary | ICD-10-CM

## 2016-07-11 MED ORDER — DIAZEPAM 2 MG PO TABS: ORAL_TABLET | ORAL | 0 refills | Status: DC

## 2016-08-02 ENCOUNTER — Ambulatory Visit (INDEPENDENT_AMBULATORY_CARE_PROVIDER_SITE_OTHER): Payer: No Typology Code available for payment source | Admitting: Psychiatry

## 2016-08-02 ENCOUNTER — Encounter (HOSPITAL_COMMUNITY): Payer: Self-pay | Admitting: Psychiatry

## 2016-08-02 DIAGNOSIS — Z79899 Other long term (current) drug therapy: Secondary | ICD-10-CM

## 2016-08-02 DIAGNOSIS — Z818 Family history of other mental and behavioral disorders: Secondary | ICD-10-CM

## 2016-08-02 DIAGNOSIS — F17210 Nicotine dependence, cigarettes, uncomplicated: Secondary | ICD-10-CM

## 2016-08-02 DIAGNOSIS — Z811 Family history of alcohol abuse and dependence: Secondary | ICD-10-CM

## 2016-08-02 DIAGNOSIS — F4010 Social phobia, unspecified: Secondary | ICD-10-CM

## 2016-08-02 DIAGNOSIS — F419 Anxiety disorder, unspecified: Secondary | ICD-10-CM

## 2016-08-02 DIAGNOSIS — F331 Major depressive disorder, recurrent, moderate: Secondary | ICD-10-CM

## 2016-08-02 MED ORDER — VENLAFAXINE HCL ER 150 MG PO CP24: 150.0000 mg | ORAL_CAPSULE | Freq: Every day | ORAL | 0 refills | Status: DC

## 2016-08-02 MED ORDER — DIAZEPAM 2 MG PO TABS: ORAL_TABLET | ORAL | 2 refills | Status: DC

## 2016-08-02 MED ORDER — LAMOTRIGINE 150 MG PO TABS
150.0000 mg | ORAL_TABLET | Freq: Two times a day (BID) | ORAL | 0 refills | Status: DC
Start: 2016-08-02 — End: 2016-11-01
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
## 2016-08-31 ENCOUNTER — Ambulatory Visit: Payer: Self-pay | Admitting: Diagnostic Neuroimaging

## 2016-09-13 ENCOUNTER — Telehealth (HOSPITAL_COMMUNITY): Payer: Self-pay

## 2016-09-13 NOTE — Telephone Encounter (Signed)
Patient is calling for a letter stating she can not work so that she can get her food stamps. Please review and advise, thank you

## 2016-09-14 NOTE — Telephone Encounter (Signed)
We can provide a letter that patient is unable to work at this time due to her mental illness.

## 2016-09-19 ENCOUNTER — Encounter (HOSPITAL_COMMUNITY): Payer: Self-pay

## 2016-09-19 NOTE — Telephone Encounter (Signed)
Letter is written, I will call patient and let her know it is ready to pick up.

## 2016-10-16 ENCOUNTER — Telehealth: Payer: Self-pay | Admitting: *Deleted

## 2016-10-16 NOTE — Telephone Encounter (Signed)
LVM requesting patient call back to reschedule July 6th appt due to dr being out of office. Advised her there are several appts open tomorrow afternoon. Left office number for call back.

## 2016-10-16 NOTE — Telephone Encounter (Signed)
Spoke with patient and informed her Dr Marjory LiesPenumalli will be out of office on July 6th, need to reschedule her appointment. She stated she is feeling better, driving sometimes and has two family members that can drive her if needed. She requested a FU on Wed, Thurs or Friday. Rescheduled her for August, advised she arrive 30 min early and to call prior to FU for any questions, needs. She verbalized understanding, appreciation for call.

## 2016-10-26 ENCOUNTER — Ambulatory Visit: Payer: Self-pay | Admitting: Diagnostic Neuroimaging

## 2016-11-01 ENCOUNTER — Encounter (HOSPITAL_COMMUNITY): Payer: Self-pay | Admitting: Psychiatry

## 2016-11-01 ENCOUNTER — Ambulatory Visit (INDEPENDENT_AMBULATORY_CARE_PROVIDER_SITE_OTHER): Payer: No Typology Code available for payment source | Admitting: Psychiatry

## 2016-11-01 VITALS — BP 128/76 | HR 83 | Ht 67.0 in | Wt 208.8 lb

## 2016-11-01 DIAGNOSIS — Z811 Family history of alcohol abuse and dependence: Secondary | ICD-10-CM

## 2016-11-01 DIAGNOSIS — F331 Major depressive disorder, recurrent, moderate: Secondary | ICD-10-CM

## 2016-11-01 DIAGNOSIS — Z818 Family history of other mental and behavioral disorders: Secondary | ICD-10-CM

## 2016-11-01 DIAGNOSIS — F17210 Nicotine dependence, cigarettes, uncomplicated: Secondary | ICD-10-CM

## 2016-11-01 MED ORDER — DIAZEPAM 2 MG PO TABS: 2.0000 mg | ORAL_TABLET | Freq: Three times a day (TID) | ORAL | 2 refills | Status: DC | PRN

## 2016-11-01 MED ORDER — HYDROXYZINE PAMOATE 25 MG PO CAPS: ORAL_CAPSULE | ORAL | 2 refills | Status: DC

## 2016-11-01 MED ORDER — LAMOTRIGINE 150 MG PO TABS: 150.0000 mg | ORAL_TABLET | Freq: Two times a day (BID) | ORAL | 0 refills | Status: DC

## 2016-11-01 MED ORDER — VENLAFAXINE HCL ER 150 MG PO CP24: 150.0000 mg | ORAL_CAPSULE | Freq: Every day | ORAL | 0 refills | Status: DC
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
## 2016-11-15 ENCOUNTER — Encounter (HOSPITAL_COMMUNITY): Payer: Self-pay | Admitting: Psychiatry

## 2016-11-15 ENCOUNTER — Ambulatory Visit (INDEPENDENT_AMBULATORY_CARE_PROVIDER_SITE_OTHER): Payer: No Typology Code available for payment source | Admitting: Psychiatry

## 2016-11-15 DIAGNOSIS — F411 Generalized anxiety disorder: Secondary | ICD-10-CM

## 2016-11-15 DIAGNOSIS — F331 Major depressive disorder, recurrent, moderate: Secondary | ICD-10-CM
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
## 2016-11-16 ENCOUNTER — Telehealth: Payer: Self-pay | Admitting: *Deleted

## 2016-11-16 NOTE — Telephone Encounter (Signed)
Attempted to reach patient to reschedule follow up due to provider being out of office. Patient's mother answered phone, stated her daughter was unavailable. Advised her will call back Monday.

## 2016-11-26 ENCOUNTER — Other Ambulatory Visit (HOSPITAL_COMMUNITY): Payer: Self-pay

## 2016-11-26 DIAGNOSIS — F331 Major depressive disorder, recurrent, moderate: Secondary | ICD-10-CM

## 2016-11-27 ENCOUNTER — Other Ambulatory Visit (HOSPITAL_COMMUNITY): Payer: Self-pay

## 2016-11-27 DIAGNOSIS — F331 Major depressive disorder, recurrent, moderate: Secondary | ICD-10-CM

## 2016-11-27 MED ORDER — VENLAFAXINE HCL ER 150 MG PO CP24: 150.0000 mg | ORAL_CAPSULE | Freq: Every day | ORAL | 0 refills | Status: DC

## 2016-11-27 MED ORDER — LAMOTRIGINE 150 MG PO TABS: 150.0000 mg | ORAL_TABLET | Freq: Two times a day (BID) | ORAL | 0 refills | Status: DC

## 2016-11-27 MED ORDER — HYDROXYZINE PAMOATE 25 MG PO CAPS: ORAL_CAPSULE | ORAL | 2 refills | Status: DC

## 2016-12-05 ENCOUNTER — Ambulatory Visit: Payer: Self-pay | Admitting: Diagnostic Neuroimaging

## 2016-12-18 ENCOUNTER — Ambulatory Visit (HOSPITAL_COMMUNITY): Payer: No Typology Code available for payment source | Admitting: Psychiatry

## 2016-12-19 ENCOUNTER — Encounter (HOSPITAL_COMMUNITY): Payer: Self-pay | Admitting: Psychiatry

## 2016-12-19 ENCOUNTER — Ambulatory Visit (INDEPENDENT_AMBULATORY_CARE_PROVIDER_SITE_OTHER): Payer: No Typology Code available for payment source | Admitting: Psychiatry

## 2016-12-19 DIAGNOSIS — F331 Major depressive disorder, recurrent, moderate: Secondary | ICD-10-CM
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
## 2016-12-25 DIAGNOSIS — G40309 Generalized idiopathic epilepsy and epileptic syndromes, not intractable, without status epilepticus: Secondary | ICD-10-CM | POA: Diagnosis not present

## 2016-12-25 DIAGNOSIS — J0100 Acute maxillary sinusitis, unspecified: Secondary | ICD-10-CM | POA: Diagnosis not present

## 2016-12-28 ENCOUNTER — Telehealth (HOSPITAL_COMMUNITY): Payer: Self-pay | Admitting: *Deleted

## 2016-12-28 NOTE — Telephone Encounter (Signed)
left voice message, provider out of office 01/16/17

## 2017-01-02 ENCOUNTER — Encounter (HOSPITAL_COMMUNITY): Payer: Self-pay | Admitting: Psychiatry

## 2017-01-02 ENCOUNTER — Ambulatory Visit (INDEPENDENT_AMBULATORY_CARE_PROVIDER_SITE_OTHER): Payer: No Typology Code available for payment source | Admitting: Psychiatry

## 2017-01-02 DIAGNOSIS — F331 Major depressive disorder, recurrent, moderate: Secondary | ICD-10-CM

## 2017-01-02 NOTE — Progress Notes (Signed)
Patient:  Hailey Owen   DOB: 12/07/1970  MR Number: [id number]  Location: [facility]:  [address] [place],  [place], [id number]       Start: Wednesday 01/02/2017 11:10 AM  End: Wednesday 01/02/2017 11:59 AM  Provider/Observer:     Florencia ReasonsPeggy Bynum, MSW, LCSW   Chief Complaint:      No chief complaint on file.  Reason For Service:     Hailey Owen is a 46 y.o. female who Is referred for services by psychiatrist Dr. Lolly MustacheArfeen due to patient experiencing symptoms of anxiety and depression. She is a returning patient to this clinician as she was seen in 2007 - 2008 due to experiencing symptoms of depression.  Patient states losing interest in going anywhere or doing anything. She also reports she has lost her independence and is worried about whether or not she is going to get disability. She expresses worry about effects on her 47 year old mother as she is providing patient with financial support. Patient also worries about having panic attacks and "seizures" and says she doesn't want to go anywhere. She also states being disgusted with self because she can't do things she used to do.  Interventions Strategy:  Supportive  Participation Level:   Active  Participation Quality:  Appropriate      Behavioral Observation:  Casual and Well Groomed, Alert, and Appropriate.   Current Psychosocial Factors: Financial stress, recent conflict with cousin  Content of Session:    reviewed symptoms, facilitated expression of thoughts and feelings, assisted patient identify triggers of increased depressed mood, began to discuss the role of behavioral activation in managing depression, discussed patient's strengths and supports, developed treatment plan   Current Status:   Fatigue, irritability, less depressed mood, anxiety, poor concentration, tension, excessive worry  Suicidal/Homicidal:    No  Patient Progress:   Fair. Patient reports  less depressed mood last week and reports increased involvement  in activity including going to a concert with relatives for her birthday. She also reports getting a car last week but now experiencing anxiety and worry regarding upcoming payments. She also says she has been depressed for the last couple of days. She  reports not feeling like doing anything when she is depressed. She says she has experienced days when she had no motivation and did not take a shower. She expresses relief issues regarding recent conflict with cousin have been resolved.   Target Goals:   1. Learn and implement behavioral strategies to overcome depression.    2. Learn and implement calming skills to reduce/manage overall anxiety.     3. Learn and implement conflict resolution skills to resolve interpersonal problems.   Last Reviewed:   01/02/2017  Goals Addressed Today:    1.2.3  Plan:      Return again in 2 weeks.  Impression/Diagnosis:  Patient presents with a long-standing history of symptoms of depression and anxiety. She reports initially experiencing symptoms 20 years ago.  She has had one psychiatric hospitalizaion which occurred in 2007 due to depression and suicidal thoughts. In recent months, symptoms of anxiety have been more prominent.    Diagnosis:  Axis I: No diagnosis found.          Axis II: Deferred    BYNUM,[name], LCSW 01/02/2017

## 2017-01-09 ENCOUNTER — Ambulatory Visit: Payer: Self-pay | Admitting: Diagnostic Neuroimaging

## 2017-01-10 ENCOUNTER — Ambulatory Visit (HOSPITAL_COMMUNITY): Payer: Self-pay | Admitting: Psychiatry

## 2017-01-16 ENCOUNTER — Ambulatory Visit (HOSPITAL_COMMUNITY): Payer: Self-pay | Admitting: Psychiatry

## 2017-01-30 ENCOUNTER — Encounter (HOSPITAL_COMMUNITY): Payer: Self-pay | Admitting: Psychiatry

## 2017-01-30 ENCOUNTER — Ambulatory Visit (INDEPENDENT_AMBULATORY_CARE_PROVIDER_SITE_OTHER): Payer: No Typology Code available for payment source | Admitting: Psychiatry

## 2017-01-30 VITALS — BP 128/80 | HR 80 | Ht 67.0 in | Wt 211.2 lb

## 2017-01-30 DIAGNOSIS — F17210 Nicotine dependence, cigarettes, uncomplicated: Secondary | ICD-10-CM

## 2017-01-30 DIAGNOSIS — G4700 Insomnia, unspecified: Secondary | ICD-10-CM

## 2017-01-30 DIAGNOSIS — F419 Anxiety disorder, unspecified: Secondary | ICD-10-CM

## 2017-01-30 DIAGNOSIS — Z736 Limitation of activities due to disability: Secondary | ICD-10-CM

## 2017-01-30 DIAGNOSIS — F410 Panic disorder [episodic paroxysmal anxiety] without agoraphobia: Secondary | ICD-10-CM

## 2017-01-30 DIAGNOSIS — Z811 Family history of alcohol abuse and dependence: Secondary | ICD-10-CM

## 2017-01-30 DIAGNOSIS — Z91410 Personal history of adult physical and sexual abuse: Secondary | ICD-10-CM

## 2017-01-30 DIAGNOSIS — F331 Major depressive disorder, recurrent, moderate: Secondary | ICD-10-CM

## 2017-01-30 DIAGNOSIS — R450 Nervousness: Secondary | ICD-10-CM

## 2017-01-30 DIAGNOSIS — R42 Dizziness and giddiness: Secondary | ICD-10-CM

## 2017-01-30 DIAGNOSIS — Z818 Family history of other mental and behavioral disorders: Secondary | ICD-10-CM

## 2017-01-30 DIAGNOSIS — R51 Headache: Secondary | ICD-10-CM

## 2017-01-30 DIAGNOSIS — Z91411 Personal history of adult psychological abuse: Secondary | ICD-10-CM

## 2017-01-30 MED ORDER — HYDROXYZINE PAMOATE 25 MG PO CAPS: ORAL_CAPSULE | ORAL | 2 refills | Status: DC

## 2017-01-30 MED ORDER — ARIPIPRAZOLE 5 MG PO TABS: ORAL_TABLET | ORAL | 1 refills | Status: DC

## 2017-01-30 MED ORDER — VENLAFAXINE HCL ER 150 MG PO CP24: 150.0000 mg | ORAL_CAPSULE | Freq: Every day | ORAL | 0 refills | Status: DC

## 2017-01-30 MED ORDER — LAMOTRIGINE 150 MG PO TABS: 150.0000 mg | ORAL_TABLET | Freq: Two times a day (BID) | ORAL | 0 refills | Status: DC
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
## 2017-01-31 ENCOUNTER — Ambulatory Visit (INDEPENDENT_AMBULATORY_CARE_PROVIDER_SITE_OTHER): Payer: No Typology Code available for payment source | Admitting: Psychiatry

## 2017-01-31 ENCOUNTER — Encounter (HOSPITAL_COMMUNITY): Payer: Self-pay | Admitting: Psychiatry

## 2017-01-31 DIAGNOSIS — F331 Major depressive disorder, recurrent, moderate: Secondary | ICD-10-CM

## 2017-01-31 DIAGNOSIS — F411 Generalized anxiety disorder: Secondary | ICD-10-CM

## 2017-01-31 DIAGNOSIS — F419 Anxiety disorder, unspecified: Secondary | ICD-10-CM

## 2017-01-31 NOTE — Progress Notes (Signed)
Patient:  Hailey Owen   DOB: 11/06/1970  MR Number: [id number]  Location: [facility]:  [address] [place],  [place], [address]       Start: Thursday 01/31/2017 1:15 PM End: Thursday 01/31/2017 2:05 PM  Provider/Observer:     Florencia Reasons, MSW, LCSW   Chief Complaint:      Chief Complaint  Patient presents with  . Depression   Reason For Service:      [name] is a 46 y.o. female who Is referred for services by psychiatrist Dr. Lolly Mustache due to patient experiencing symptoms of anxiety and depression. She is a returning patient to this clinician as she was seen in 2007 - 2008 due to experiencing symptoms of depression.  Patient states losing interest in going anywhere or doing anything. She also reports she has lost her independence and is worried about whether or not she is going to get disability. She expresses worry about effects on her 38 year old mother as she is providing patient with financial support. Patient also worries about having panic attacks and "seizures" and says she doesn't want to go anywhere. She also states being disgusted with self because she can't do things she used to do.  Interventions Strategy:  Supportive  Participation Level:   Active  Participation Quality:  Appropriate      Behavioral Observation:  Casual and Well Groomed, Alert, and Appropriate.   Current Psychosocial Factors: Financial stress, recent conflict with cousin  Content of Session:    reviewed symptoms, facilitated expression of thoughts and feelings, began to examine patient's schema, assisted patient identify triggers of anxiety/panic attacks, assisted patient identify the way her body experiences anxiety and stress, provided psychoeducation on anxiety and stress response along with ways to trigger relaxation response, discussed rationale for and practiced deep breathing, assigned patient to practice 5 minutes 2 x per day   Current Status:   Fatigue, irritability, less  depressed mood, anxiety, poor concentration, tension, excessive worry  Suicidal/Homicidal:    No  Patient Progress:   Fair. Patient reports she hasn't really experienced symptoms of depression since last session. She has been  involved in various activities.  She expresses relief she was approved for disability income and received her first check. She has continued to experience significant anxiety, panic attacks, and reports having a meltdown this past Tuesday. She attributes this to a reaction to Valium as she says the medication makes her hyper. She has addressed these issues with psychiatrist Dr. Lolly Mustache. Patient also identifies stress regarding her uncle and cousin arguing in her presence as a possible trigger.   Target Goals:   1. Learn and implement behavioral strategies to overcome depression.    2. Learn and implement calming skills to reduce/manage overall anxiety.     3. Learn and implement conflict resolution skills to resolve interpersonal problems.   Last Reviewed:   01/02/2017  Goals Addressed Today:    2  Plan:      Return again in 2 weeks.  Impression/Diagnosis:  Patient presents with a long-standing history of symptoms of depression and anxiety. She reports initially experiencing symptoms 20 years ago.  She has had one psychiatric hospitalizaion which occurred in 2007 due to depression and suicidal thoughts. In recent months, symptoms of anxiety have been more prominent.    Diagnosis:  Axis I: Major Depressive Disorder, recurrent, moderate          Axis II: Deferred    Oris Calmes, LCSW 01/31/2017

## 2017-02-06 DIAGNOSIS — I10 Essential (primary) hypertension: Secondary | ICD-10-CM | POA: Diagnosis not present

## 2017-02-06 DIAGNOSIS — J45909 Unspecified asthma, uncomplicated: Secondary | ICD-10-CM | POA: Diagnosis not present

## 2017-02-06 DIAGNOSIS — F329 Major depressive disorder, single episode, unspecified: Secondary | ICD-10-CM | POA: Diagnosis not present

## 2017-02-06 DIAGNOSIS — Z2821 Immunization not carried out because of patient refusal: Secondary | ICD-10-CM | POA: Diagnosis not present

## 2017-02-06 DIAGNOSIS — Z6833 Body mass index (BMI) 33.0-33.9, adult: Secondary | ICD-10-CM | POA: Diagnosis not present

## 2017-02-06 DIAGNOSIS — Z299 Encounter for prophylactic measures, unspecified: Secondary | ICD-10-CM | POA: Diagnosis not present

## 2017-02-06 DIAGNOSIS — F17210 Nicotine dependence, cigarettes, uncomplicated: Secondary | ICD-10-CM | POA: Diagnosis not present

## 2017-02-11 DIAGNOSIS — I10 Essential (primary) hypertension: Secondary | ICD-10-CM | POA: Diagnosis not present

## 2017-02-11 DIAGNOSIS — F17210 Nicotine dependence, cigarettes, uncomplicated: Secondary | ICD-10-CM | POA: Diagnosis not present

## 2017-02-11 DIAGNOSIS — J45909 Unspecified asthma, uncomplicated: Secondary | ICD-10-CM | POA: Diagnosis not present

## 2017-02-11 DIAGNOSIS — E669 Obesity, unspecified: Secondary | ICD-10-CM | POA: Diagnosis not present

## 2017-02-11 DIAGNOSIS — Z299 Encounter for prophylactic measures, unspecified: Secondary | ICD-10-CM | POA: Diagnosis not present

## 2017-02-11 DIAGNOSIS — F329 Major depressive disorder, single episode, unspecified: Secondary | ICD-10-CM | POA: Diagnosis not present

## 2017-02-12 ENCOUNTER — Telehealth (HOSPITAL_COMMUNITY): Payer: Self-pay | Admitting: *Deleted

## 2017-02-12 NOTE — Telephone Encounter (Signed)
left message with patient's uncle to call office regarding provider out of office 02/15/17.

## 2017-02-15 ENCOUNTER — Ambulatory Visit (HOSPITAL_COMMUNITY): Payer: Self-pay | Admitting: Psychiatry

## 2017-02-26 ENCOUNTER — Encounter: Payer: Self-pay | Admitting: Diagnostic Neuroimaging

## 2017-03-01 ENCOUNTER — Ambulatory Visit (HOSPITAL_COMMUNITY): Payer: Self-pay | Admitting: Psychiatry

## 2017-03-04 DIAGNOSIS — J329 Chronic sinusitis, unspecified: Secondary | ICD-10-CM | POA: Diagnosis not present

## 2017-03-04 DIAGNOSIS — M5432 Sciatica, left side: Secondary | ICD-10-CM | POA: Diagnosis not present

## 2017-03-21 ENCOUNTER — Ambulatory Visit (HOSPITAL_COMMUNITY): Payer: Self-pay | Admitting: Psychiatry

## 2017-03-22 ENCOUNTER — Other Ambulatory Visit (HOSPITAL_COMMUNITY): Payer: Self-pay | Admitting: Psychiatry

## 2017-03-22 DIAGNOSIS — F331 Major depressive disorder, recurrent, moderate: Secondary | ICD-10-CM

## 2017-03-28 ENCOUNTER — Other Ambulatory Visit (HOSPITAL_COMMUNITY): Payer: Self-pay

## 2017-03-28 DIAGNOSIS — F331 Major depressive disorder, recurrent, moderate: Secondary | ICD-10-CM

## 2017-04-15 ENCOUNTER — Other Ambulatory Visit (HOSPITAL_COMMUNITY): Payer: Self-pay

## 2017-04-15 DIAGNOSIS — F331 Major depressive disorder, recurrent, moderate: Secondary | ICD-10-CM

## 2017-04-15 MED ORDER — VENLAFAXINE HCL ER 150 MG PO CP24: 150.0000 mg | ORAL_CAPSULE | Freq: Every day | ORAL | 0 refills | Status: DC

## 2017-04-26 DIAGNOSIS — Z6833 Body mass index (BMI) 33.0-33.9, adult: Secondary | ICD-10-CM | POA: Diagnosis not present

## 2017-04-26 DIAGNOSIS — G43909 Migraine, unspecified, not intractable, without status migrainosus: Secondary | ICD-10-CM | POA: Diagnosis not present

## 2017-04-26 DIAGNOSIS — Z299 Encounter for prophylactic measures, unspecified: Secondary | ICD-10-CM | POA: Diagnosis not present

## 2017-04-26 DIAGNOSIS — F17210 Nicotine dependence, cigarettes, uncomplicated: Secondary | ICD-10-CM | POA: Diagnosis not present

## 2017-04-26 DIAGNOSIS — Z713 Dietary counseling and surveillance: Secondary | ICD-10-CM | POA: Diagnosis not present

## 2017-05-17 ENCOUNTER — Ambulatory Visit: Payer: Self-pay | Admitting: Diagnostic Neuroimaging

## 2017-05-17 ENCOUNTER — Ambulatory Visit (HOSPITAL_COMMUNITY): Payer: Medicare HMO | Admitting: Psychiatry

## 2017-05-17 ENCOUNTER — Encounter (HOSPITAL_COMMUNITY): Payer: Self-pay | Admitting: Psychiatry

## 2017-05-17 VITALS — BP 128/72 | HR 88 | Ht 67.0 in | Wt 216.0 lb

## 2017-05-17 DIAGNOSIS — F17210 Nicotine dependence, cigarettes, uncomplicated: Secondary | ICD-10-CM

## 2017-05-17 DIAGNOSIS — F4310 Post-traumatic stress disorder, unspecified: Secondary | ICD-10-CM

## 2017-05-17 DIAGNOSIS — G4730 Sleep apnea, unspecified: Secondary | ICD-10-CM

## 2017-05-17 DIAGNOSIS — F1010 Alcohol abuse, uncomplicated: Secondary | ICD-10-CM | POA: Diagnosis not present

## 2017-05-17 DIAGNOSIS — F411 Generalized anxiety disorder: Secondary | ICD-10-CM | POA: Diagnosis not present

## 2017-05-17 DIAGNOSIS — Z811 Family history of alcohol abuse and dependence: Secondary | ICD-10-CM

## 2017-05-17 DIAGNOSIS — Z818 Family history of other mental and behavioral disorders: Secondary | ICD-10-CM | POA: Diagnosis not present

## 2017-05-17 DIAGNOSIS — R42 Dizziness and giddiness: Secondary | ICD-10-CM | POA: Diagnosis not present

## 2017-05-17 DIAGNOSIS — F331 Major depressive disorder, recurrent, moderate: Secondary | ICD-10-CM

## 2017-05-17 DIAGNOSIS — F410 Panic disorder [episodic paroxysmal anxiety] without agoraphobia: Secondary | ICD-10-CM

## 2017-05-17 DIAGNOSIS — R450 Nervousness: Secondary | ICD-10-CM

## 2017-05-17 DIAGNOSIS — Z634 Disappearance and death of family member: Secondary | ICD-10-CM

## 2017-05-17 MED ORDER — ARIPIPRAZOLE 5 MG PO TABS: 5.0000 mg | ORAL_TABLET | Freq: Every day | ORAL | 0 refills | Status: DC

## 2017-05-17 MED ORDER — LORAZEPAM 0.5 MG PO TABS: 0.5000 mg | ORAL_TABLET | Freq: Every day | ORAL | 0 refills | Status: DC | PRN

## 2017-05-17 MED ORDER — VENLAFAXINE HCL ER 150 MG PO CP24: 150.0000 mg | ORAL_CAPSULE | Freq: Every day | ORAL | 0 refills | Status: DC
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
## 2017-05-20 ENCOUNTER — Ambulatory Visit (INDEPENDENT_AMBULATORY_CARE_PROVIDER_SITE_OTHER): Payer: Medicare HMO | Admitting: Psychiatry

## 2017-05-20 ENCOUNTER — Encounter (HOSPITAL_COMMUNITY): Payer: Self-pay | Admitting: Psychiatry

## 2017-05-20 DIAGNOSIS — F411 Generalized anxiety disorder: Secondary | ICD-10-CM

## 2017-05-20 DIAGNOSIS — F331 Major depressive disorder, recurrent, moderate: Secondary | ICD-10-CM

## 2017-05-20 NOTE — Progress Notes (Addendum)
Patient:  Hailey Owen   DOB: [birth year]  MR Number: [id number]  Location: [facility]:  [address] [place],  [place], [address]       Start: Monday 05/20/2017 2:15 PM         End: Monday 05/20/2017 3:00 PM  Provider/Observer:     Florencia Reasons, MSW, LCSW   Chief Complaint:      Chief Complaint  Patient presents with  . Depression   Reason For Service:      Hailey Owen is a 47 y.o. female who Is referred for services by psychiatrist Dr. Lolly Mustache due to patient experiencing symptoms of anxiety and depression. She is a returning patient to this clinician as she was seen in Sep 17, 2005 Sep 18, 2006 due to experiencing symptoms of depression.  Patient states losing interest in going anywhere or doing anything. She also reports she has lost her independence and is worried about whether or not she is going to get disability. She expresses worry about effects on her 22 year old mother as she is providing patient with financial support. Patient also worries about having panic attacks and "seizures" and says she doesn't want to go anywhere. She also states being disgusted with self because she can't do things she used to do.  Interventions Strategy:  Supportive  Participation Level:   Active  Participation Quality:  Appropriate      Behavioral Observation:  Casual and Well Groomed, Alert, and Appropriate.   Current Psychosocial Factors: Financial stress, recent conflict with cousin  Content of Session:    reviewed symptoms, discussed stressors, used nondirective approach to allow patient to verbalize thoughts and feelings about her uncle's death as well as she had been narrative of his death, discussed mourning rituals in which patient participated, normalized grief process, discussed patient's concerns about relationship with her mother, examined patient's pattern of interaction with her mother, praised and reinforced patient's use of relaxation technique using diaphragmatic breathing,   Current  Status:   Sadness, continued anxiety and worry, decreased intensity and frequency of panic attacks   Suicidal/Homicidal:    No  Patient Progress:   Patient last was seen in October 2018. She reports her maternal uncle died near Thanksgiving in Sep 17, 2016. She reports the holidays were very difficult as a result of this. She has continued to have crying spells as she misses uncle greatly. However she says she comforts self with thoughts of seeing him again one day. She reports decreased intensity and frequency of panic attacks. She says she has been using diaphragmatic breathing to manage and this has been helpful. She reports been thankful for mother support but expresses frustration regarding her mother's expectations. Patient reports difficulty being assertive and setting/maintaining  boundaries with mother.  Target Goals:   1. Learn and implement behavioral strategies to overcome depression.    2. Learn and implement calming skills to reduce/manage overall anxiety.     3. Learn and implement conflict resolution skills to resolve interpersonal problems.   Last Reviewed:   01/02/2017  Goals Addressed Today:    2  Plan:      Return again in 2 weeks.  Impression/Diagnosis:  Patient presents with a long-standing history of symptoms of depression and anxiety. She reports initially experiencing symptoms 20 years ago.  She has had one psychiatric hospitalizaion which occurred in Sep 17, 2005 due to depression and suicidal thoughts. In recent months, symptoms of anxiety have been more prominent.    Diagnosis:  Axis I: Major Depressive Disorder, recurrent, moderate  Axis II: Deferred    Cloys Vera, LCSW 05/20/2017

## 2017-06-11 ENCOUNTER — Ambulatory Visit (HOSPITAL_COMMUNITY): Payer: Medicare HMO | Admitting: Psychiatry

## 2017-06-12 ENCOUNTER — Other Ambulatory Visit (HOSPITAL_COMMUNITY): Payer: Self-pay

## 2017-06-12 DIAGNOSIS — F411 Generalized anxiety disorder: Secondary | ICD-10-CM

## 2017-06-12 MED ORDER — LORAZEPAM 0.5 MG PO TABS: 0.5000 mg | ORAL_TABLET | Freq: Every day | ORAL | 0 refills | Status: DC | PRN

## 2017-06-25 ENCOUNTER — Ambulatory Visit (HOSPITAL_COMMUNITY): Payer: Self-pay | Admitting: Psychiatry

## 2017-07-09 ENCOUNTER — Encounter (HOSPITAL_COMMUNITY): Payer: Self-pay | Admitting: Psychiatry

## 2017-07-09 ENCOUNTER — Ambulatory Visit (INDEPENDENT_AMBULATORY_CARE_PROVIDER_SITE_OTHER): Payer: Medicare HMO | Admitting: Psychiatry

## 2017-07-09 DIAGNOSIS — F411 Generalized anxiety disorder: Secondary | ICD-10-CM

## 2017-07-09 DIAGNOSIS — F331 Major depressive disorder, recurrent, moderate: Secondary | ICD-10-CM

## 2017-07-09 NOTE — Progress Notes (Signed)
Patient:  Hailey Owen   DOB: 1971/03/12  MR Number: [id number]  Location: [facility]:  [address] [place] [place],  [place], [address]       Start: Tuesday: 07/09/2017 4:15 PM End: Tuesday: 07/09/2017 4:55 PM  Provider/Observer:     Florencia Reasons, MSW, LCSW   Chief Complaint:      Chief Complaint  Patient presents with  . Depression   Reason For Service:      Hailey Owen is a 47 y.o. female who Is referred for services by psychiatrist Dr. Lolly Mustache due to patient experiencing symptoms of anxiety and depression. She is a returning patient to this clinician as she was seen in 2007 - 2008 due to experiencing symptoms of depression.  Patient states losing interest in going anywhere or doing anything. She also reports she has lost her independence and is worried about whether or not she is going to get disability. She expresses worry about effects on her 33 year old mother as she is providing patient with financial support. Patient also worries about having panic attacks and "seizures" and says she doesn't want to go anywhere. She also states being disgusted with self because she can't do things she used to do.  Interventions Strategy:  Supportive  Participation Level:   Active  Participation Quality:  Appropriate      Behavioral Observation:  Casual and Well Groomed, Alert, and Appropriate.   Current Psychosocial Factors: Financial stress, recent conflict with cousin  Content of Session:    reviewed symptoms, discussed stressors, facilitated expression of thoughts and feelings, assisted patient examine her pattern of interaction with her mother and sister, assisted patient identify ways to improve empathic and assertiveness skills in communication with sister, discussed and practiced ways to set and maintain boundaries with sister and mother to avoid triangulation, reviewed relaxation techniques   Current Status:   Sadness, decreased anxiety and worry, decreased intensity and  frequency of panic attacks   Suicidal/Homicidal:    No  Patient Progress:   Patient last was seen in January 2019. She reports less anxiety and improved used of relaxation technique (deep breathing) and says it has been helpful. She continues to have grief and loss issues regarding the death of her maternal uncle but reports managing fairly well. She reports increased stress related to her older sister returning from [place] to reside with her and her mother about a month and a half ago. Patient expresses frustration, resentment, and anger as sister is "trying to take over" per patient's report. She also expresses anger regarding the way sister treats their mother. She expresses sadness and hurt as mother expresses hurt to patient regarding sister's behavior.   Target Goals:   1. Learn and implement behavioral strategies to overcome depression.    2. Learn and implement calming skills to reduce/manage overall anxiety.     3. Learn and implement conflict resolution skills to resolve interpersonal problems.   Last Reviewed:   01/02/2017  Goals Addressed Today:    2,3  Plan:      Return again in 2 weeks.  Impression/Diagnosis:  Patient presents with a long-standing history of symptoms of depression and anxiety. She reports initially experiencing symptoms 20 years ago.  She has had one psychiatric hospitalizaion which occurred in 2007 due to depression and suicidal thoughts. In recent months, symptoms of anxiety have been more prominent.    Diagnosis:  Axis I: Major Depressive Disorder, recurrent, moderate          Axis II: Deferred  Hailey Lavigne, LCSW 07/09/2017

## 2017-07-16 ENCOUNTER — Ambulatory Visit (HOSPITAL_COMMUNITY): Payer: Medicare HMO | Admitting: Psychiatry

## 2017-07-22 ENCOUNTER — Encounter (HOSPITAL_COMMUNITY): Payer: Self-pay | Admitting: Psychiatry

## 2017-07-22 ENCOUNTER — Ambulatory Visit (INDEPENDENT_AMBULATORY_CARE_PROVIDER_SITE_OTHER): Payer: Medicare HMO | Admitting: Psychiatry

## 2017-07-22 DIAGNOSIS — Z811 Family history of alcohol abuse and dependence: Secondary | ICD-10-CM | POA: Diagnosis not present

## 2017-07-22 DIAGNOSIS — Z818 Family history of other mental and behavioral disorders: Secondary | ICD-10-CM | POA: Diagnosis not present

## 2017-07-22 DIAGNOSIS — F410 Panic disorder [episodic paroxysmal anxiety] without agoraphobia: Secondary | ICD-10-CM | POA: Diagnosis not present

## 2017-07-22 DIAGNOSIS — F331 Major depressive disorder, recurrent, moderate: Secondary | ICD-10-CM

## 2017-07-22 DIAGNOSIS — F4310 Post-traumatic stress disorder, unspecified: Secondary | ICD-10-CM

## 2017-07-22 DIAGNOSIS — F17210 Nicotine dependence, cigarettes, uncomplicated: Secondary | ICD-10-CM

## 2017-07-22 DIAGNOSIS — F411 Generalized anxiety disorder: Secondary | ICD-10-CM | POA: Diagnosis not present

## 2017-07-22 DIAGNOSIS — F1099 Alcohol use, unspecified with unspecified alcohol-induced disorder: Secondary | ICD-10-CM | POA: Diagnosis not present

## 2017-07-22 MED ORDER — ARIPIPRAZOLE 5 MG PO TABS: 5.0000 mg | ORAL_TABLET | Freq: Every day | ORAL | 0 refills | Status: DC

## 2017-07-22 MED ORDER — LORAZEPAM 0.5 MG PO TABS: 0.5000 mg | ORAL_TABLET | Freq: Every day | ORAL | 1 refills | Status: DC | PRN

## 2017-07-22 MED ORDER — LAMOTRIGINE 150 MG PO TABS: 150.0000 mg | ORAL_TABLET | Freq: Two times a day (BID) | ORAL | 0 refills | Status: DC

## 2017-07-22 MED ORDER — VENLAFAXINE HCL ER 150 MG PO CP24: 150.0000 mg | ORAL_CAPSULE | Freq: Every day | ORAL | 0 refills | Status: DC
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
## 2017-07-23 ENCOUNTER — Ambulatory Visit (HOSPITAL_COMMUNITY): Payer: Medicare HMO | Admitting: Psychiatry

## 2017-09-19 ENCOUNTER — Encounter (HOSPITAL_COMMUNITY): Payer: Self-pay | Admitting: Psychiatry

## 2017-09-19 ENCOUNTER — Ambulatory Visit (INDEPENDENT_AMBULATORY_CARE_PROVIDER_SITE_OTHER): Payer: Medicare HMO | Admitting: Psychiatry

## 2017-09-19 DIAGNOSIS — F331 Major depressive disorder, recurrent, moderate: Secondary | ICD-10-CM | POA: Diagnosis not present

## 2017-09-19 NOTE — Progress Notes (Signed)
Patient:  Chetara Kropp   DOB: [birth year]  MR Number: [id number]  Location: [facility]:  [address] [place],  [place],     [address]       Start: Thursday 09/19/2017 8:14 AM  End: Thursday 09/19/2017 9:05 AM  Provider/Observer:     Florencia Reasons, MSW, LCSW   Chief Complaint:      Chief Complaint  Patient presents with  . Depression   Reason For Service:      Deneice Wack is a 47 y.o. female who Is referred for services by psychiatrist Dr. Lolly Mustache due to patient experiencing symptoms of anxiety and depression. She is a returning patient to this clinician as she was seen in 2007 - 2008 due to experiencing symptoms of depression.  Patient states losing interest in going anywhere or doing anything. She also reports she has lost her independence and is worried about whether or not she is going to get disability. She expresses worry about effects on her 73 year old mother as she is providing patient with financial support. Patient also worries about having panic attacks and "seizures" and says she doesn't want to go anywhere. She also states being disgusted with self because she can't do things she used to do.  Interventions Strategy:  Supportive  Participation Level:   Active  Participation Quality:  Appropriate      Behavioral Observation:  Casual and Well Groomed, Alert, and Appropriate.   Current Psychosocial Factors: Financial stress, recent conflict with cousin  Content of Session:    reviewed symptoms, administered PHQ-9 and GAD-7, praised and reinforced patient's use of assertiveness skills in communication with sister, discussed stressors, facilitated expression of thoughts and feelings, validated feelings, discussed treatment plan, reviewed rationale for practicing relaxation breathing consistently, assigned patient to practice 5-10 minutes 2 x per day   Current Status:    anxiety and worry, panic attacks   Suicidal/Homicidal:    No  Patient Progress:   Patient last  was seen 2 months ago. She reports continued stress and anxiety. She expresses less worry about interaction with sister as patient has used assertiveness skill and reports relationship has improved. She continues to worry about her mother who is very pessimistic and negative per patient's report. She also reports stress related to mother's disapproval of patient's boyfriend. Patient is reluctant to express concerns to mother as she fears mother's possible reaction of becoming angry and not assisting patient per patient's report.    Target Goals:   1. Learn and implement behavioral strategies to overcome depression.    2. Learn and implement calming skills to reduce/manage overall anxiety.     3. Learn and implement conflict resolution skills to resolve interpersonal problems.   Last Reviewed:   01/02/2017  Goals Addressed Today:    2,3  Plan:      Return again in 2 weeks.  Impression/Diagnosis:  Patient presents with a long-standing history of symptoms of depression and anxiety. She reports initially experiencing symptoms 20 years ago.  She has had one psychiatric hospitalizaion which occurred in 2007 due to depression and suicidal thoughts. In recent months, symptoms of anxiety have been more prominent.    Diagnosis:  Axis I: Major Depressive Disorder, recurrent, moderate          Axis II: Deferred    Margaret Staggs, LCSW 09/19/2017

## 2017-09-26 ENCOUNTER — Ambulatory Visit (INDEPENDENT_AMBULATORY_CARE_PROVIDER_SITE_OTHER): Payer: Medicare HMO | Admitting: Otolaryngology

## 2017-09-26 DIAGNOSIS — H6983 Other specified disorders of Eustachian tube, bilateral: Secondary | ICD-10-CM

## 2017-09-26 DIAGNOSIS — H6121 Impacted cerumen, right ear: Secondary | ICD-10-CM

## 2017-09-26 DIAGNOSIS — H9011 Conductive hearing loss, unilateral, right ear, with unrestricted hearing on the contralateral side: Secondary | ICD-10-CM

## 2017-10-14 ENCOUNTER — Telehealth: Payer: Self-pay | Admitting: Diagnostic Neuroimaging

## 2017-10-14 NOTE — Telephone Encounter (Signed)
I will see patient for her migraines. 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 Let her know I can see her for migraines but not her non-epileptic spells if she is ok with that I can see her for her migraines only. If she wants to discuss her non-epileptic ebents she will have to go back to Dr. Marjory LiesPenumalli if he will see her; since they are non-epileptic we can;t treat those her [facility] neurology.  thanks

## 2017-10-14 NOTE — Telephone Encounter (Signed)
Good afternoon. We received a new referral on pt for migraines and seizures, and she has seen Dr. Marjory LiesPenumalli in the past but is requesting to switch to Dr. Lucia GaskinsAhern. Are you both ok with this?

## 2017-10-17 NOTE — Telephone Encounter (Signed)
Ok to switch to Dr. Lucia GaskinsAhern for migraine treatment. Re: non-epileptic spells / syncope, I have completed my evaluation and have no further recommendations. She should follow up with PCP re: those events. -VRP

## 2017-10-21 ENCOUNTER — Encounter (HOSPITAL_COMMUNITY): Payer: Self-pay | Admitting: Psychiatry

## 2017-10-21 ENCOUNTER — Ambulatory Visit (HOSPITAL_COMMUNITY): Payer: Medicare HMO | Admitting: Psychiatry

## 2017-10-21 DIAGNOSIS — F331 Major depressive disorder, recurrent, moderate: Secondary | ICD-10-CM

## 2017-10-21 DIAGNOSIS — F411 Generalized anxiety disorder: Secondary | ICD-10-CM | POA: Diagnosis not present

## 2017-10-21 MED ORDER — LORAZEPAM 0.5 MG PO TABS: ORAL_TABLET | ORAL | 2 refills | Status: DC

## 2017-10-21 MED ORDER — ARIPIPRAZOLE 5 MG PO TABS: 5.0000 mg | ORAL_TABLET | Freq: Every day | ORAL | 0 refills | Status: DC

## 2017-10-21 MED ORDER — VENLAFAXINE HCL ER 150 MG PO CP24: 150.0000 mg | ORAL_CAPSULE | Freq: Every day | ORAL | 0 refills | Status: DC

## 2017-10-21 MED ORDER — LAMOTRIGINE 150 MG PO TABS: 150.0000 mg | ORAL_TABLET | Freq: Two times a day (BID) | ORAL | 0 refills | Status: DC
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
## 2017-10-28 DIAGNOSIS — L720 Epidermal cyst: Secondary | ICD-10-CM | POA: Diagnosis not present

## 2017-10-28 DIAGNOSIS — L738 Other specified follicular disorders: Secondary | ICD-10-CM | POA: Diagnosis not present

## 2017-10-28 DIAGNOSIS — L700 Acne vulgaris: Secondary | ICD-10-CM | POA: Diagnosis not present

## 2017-11-02 DIAGNOSIS — I10 Essential (primary) hypertension: Secondary | ICD-10-CM | POA: Diagnosis not present

## 2017-11-02 DIAGNOSIS — F331 Major depressive disorder, recurrent, moderate: Secondary | ICD-10-CM | POA: Diagnosis not present

## 2017-11-02 DIAGNOSIS — Z6834 Body mass index (BMI) 34.0-34.9, adult: Secondary | ICD-10-CM | POA: Diagnosis not present

## 2017-11-02 DIAGNOSIS — H547 Unspecified visual loss: Secondary | ICD-10-CM | POA: Diagnosis not present

## 2017-11-02 DIAGNOSIS — Z720 Tobacco use: Secondary | ICD-10-CM | POA: Diagnosis not present

## 2017-11-02 DIAGNOSIS — F17200 Nicotine dependence, unspecified, uncomplicated: Secondary | ICD-10-CM | POA: Diagnosis not present

## 2017-11-02 DIAGNOSIS — E785 Hyperlipidemia, unspecified: Secondary | ICD-10-CM | POA: Diagnosis not present

## 2017-11-02 DIAGNOSIS — Z90710 Acquired absence of both cervix and uterus: Secondary | ICD-10-CM | POA: Diagnosis not present

## 2017-11-02 DIAGNOSIS — F418 Other specified anxiety disorders: Secondary | ICD-10-CM | POA: Diagnosis not present

## 2017-11-04 DIAGNOSIS — Z713 Dietary counseling and surveillance: Secondary | ICD-10-CM | POA: Diagnosis not present

## 2017-11-04 DIAGNOSIS — Z6835 Body mass index (BMI) 35.0-35.9, adult: Secondary | ICD-10-CM | POA: Diagnosis not present

## 2017-11-04 DIAGNOSIS — Z299 Encounter for prophylactic measures, unspecified: Secondary | ICD-10-CM | POA: Diagnosis not present

## 2017-11-04 DIAGNOSIS — I10 Essential (primary) hypertension: Secondary | ICD-10-CM | POA: Diagnosis not present

## 2017-11-04 DIAGNOSIS — F17210 Nicotine dependence, cigarettes, uncomplicated: Secondary | ICD-10-CM | POA: Diagnosis not present

## 2017-11-11 DIAGNOSIS — I10 Essential (primary) hypertension: Secondary | ICD-10-CM | POA: Diagnosis not present

## 2017-11-11 DIAGNOSIS — Z6835 Body mass index (BMI) 35.0-35.9, adult: Secondary | ICD-10-CM | POA: Diagnosis not present

## 2017-11-11 DIAGNOSIS — R350 Frequency of micturition: Secondary | ICD-10-CM | POA: Diagnosis not present

## 2017-11-11 DIAGNOSIS — Z713 Dietary counseling and surveillance: Secondary | ICD-10-CM | POA: Diagnosis not present

## 2017-11-11 DIAGNOSIS — Z299 Encounter for prophylactic measures, unspecified: Secondary | ICD-10-CM | POA: Diagnosis not present

## 2017-11-13 ENCOUNTER — Ambulatory Visit (INDEPENDENT_AMBULATORY_CARE_PROVIDER_SITE_OTHER): Payer: Medicare HMO | Admitting: Psychiatry

## 2017-11-13 DIAGNOSIS — F331 Major depressive disorder, recurrent, moderate: Secondary | ICD-10-CM

## 2017-11-13 DIAGNOSIS — F411 Generalized anxiety disorder: Secondary | ICD-10-CM | POA: Diagnosis not present

## 2017-11-13 NOTE — Progress Notes (Signed)
Patient:  Hailey Owen   DOB: 05/18/1970  MR Number: [id number]  Location: [facility]:  [address] [place],  [place],     [id number]             Start: Wednesday 11/13/2017 8:17 AM  End: Wednesday 11/13/2017 9:07 AM   Provider/Observer:     Florencia ReasonsPeggy Lanasia Porras, MSW, LCSW   Chief Complaint:      Chief Complaint  Patient presents with  . Depression   Reason For Service:      Hailey Owen is a 47 y.o. female who Is referred for services by psychiatrist Dr. Lolly MustacheArfeen due to patient experiencing symptoms of anxiety and depression. She is a returning patient to this clinician as she was seen in 2007 - 2008 due to experiencing symptoms of depression.  Patient states losing interest in going anywhere or doing anything. She also reports she has lost her independence and is worried about whether or not she is going to get disability. She expresses worry about effects on her 48 year old mother as she is providing patient with financial support. Patient also worries about having panic attacks and "seizures" and says she doesn't want to go anywhere. She also states being disgusted with self because she can't do things she used to do.  Interventions Strategy:  Supportive  Participation Level:   Active  Participation Quality:  Appropriate      Behavioral Observation:  Casual and Well Groomed, Alert, and Appropriate.   Current Psychosocial Factors: Financial stress, recent conflict with cousin  Content of Session:    reviewed symptoms, praised and reinforced patient's use of assertiveness skills in relationship with mother and friend, discussed stressors, facilitated expression of thoughts and feelings, validated feelings, gathered more information from patient regarding effects of trauma history on current interpersonal functioning , assisted patient to begin to identify treatment targets (become more independent) and objectives ( improve communication skills in relationship with mother) and goals (  learn and implement assertiveness skills),   Current Status:    anxiety and worry, panic attacks   Suicidal/Homicidal:    No  Patient Progress:   Patient last was seen 2 months ago. She reports less depressed mood but continued stress and anxiety. She reports breaking up with boyfriend but remaining friends and attending church together. Patient reports breaking up with him due to stress from mother's response and disapproval of the relationship as well as her concerns about boyfriend's alcohol dependence. Patient reports mother disapproves of continued contact with him but has accepted it. Patient is pleased she set limits with mother but reports continued difficulty expressing concerns to mother in an assertive manner.    Target Goals:   1. Learn and implement behavioral strategies to overcome depression.    2. Learn and implement calming skills to reduce/manage overall anxiety.     3. Learn and implement conflict resolution skills to resolve interpersonal problems.   Last Reviewed:   01/02/2017  Goals Addressed Today:    2,3  Plan:      Return again in 2 weeks.  Impression/Diagnosis:  Patient presents with a long-standing history of symptoms of depression and anxiety. She reports initially experiencing symptoms 20 years ago.  She has had one psychiatric hospitalizaion which occurred in 2007 due to depression and suicidal thoughts. In recent months, symptoms of anxiety have been more prominent.    Diagnosis:  Axis I: Major Depressive Disorder, recurrent, moderate          Axis II: Deferred  Kylie Simmonds, LCSW 11/13/2017

## 2017-11-18 ENCOUNTER — Other Ambulatory Visit (HOSPITAL_COMMUNITY): Payer: Self-pay | Admitting: Psychiatry

## 2017-11-18 DIAGNOSIS — F331 Major depressive disorder, recurrent, moderate: Secondary | ICD-10-CM

## 2017-11-22 ENCOUNTER — Other Ambulatory Visit (HOSPITAL_COMMUNITY): Payer: Self-pay | Admitting: Psychiatry

## 2017-11-22 DIAGNOSIS — F331 Major depressive disorder, recurrent, moderate: Secondary | ICD-10-CM

## 2017-11-25 ENCOUNTER — Other Ambulatory Visit (HOSPITAL_COMMUNITY): Payer: Self-pay

## 2017-11-25 DIAGNOSIS — F331 Major depressive disorder, recurrent, moderate: Secondary | ICD-10-CM

## 2017-11-25 MED ORDER — ARIPIPRAZOLE 5 MG PO TABS: 5.0000 mg | ORAL_TABLET | Freq: Every day | ORAL | 0 refills | Status: DC

## 2017-11-25 MED ORDER — LAMOTRIGINE 150 MG PO TABS
150.0000 mg | ORAL_TABLET | Freq: Two times a day (BID) | ORAL | 0 refills | Status: DC
Start: 2017-11-25 — End: 2018-01-21

## 2017-11-25 MED ORDER — VENLAFAXINE HCL ER 150 MG PO CP24: 150.0000 mg | ORAL_CAPSULE | Freq: Every day | ORAL | 0 refills | Status: DC

## 2017-11-27 ENCOUNTER — Ambulatory Visit (INDEPENDENT_AMBULATORY_CARE_PROVIDER_SITE_OTHER): Payer: Medicare HMO | Admitting: Psychiatry

## 2017-11-27 DIAGNOSIS — F411 Generalized anxiety disorder: Secondary | ICD-10-CM

## 2017-11-27 DIAGNOSIS — F331 Major depressive disorder, recurrent, moderate: Secondary | ICD-10-CM
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
## 2017-11-28 ENCOUNTER — Ambulatory Visit (INDEPENDENT_AMBULATORY_CARE_PROVIDER_SITE_OTHER): Payer: Medicare HMO | Admitting: Otolaryngology

## 2017-11-28 DIAGNOSIS — H6983 Other specified disorders of Eustachian tube, bilateral: Secondary | ICD-10-CM

## 2017-11-28 DIAGNOSIS — H9011 Conductive hearing loss, unilateral, right ear, with unrestricted hearing on the contralateral side: Secondary | ICD-10-CM | POA: Diagnosis not present

## 2017-12-11 ENCOUNTER — Ambulatory Visit (INDEPENDENT_AMBULATORY_CARE_PROVIDER_SITE_OTHER): Payer: Medicare HMO | Admitting: Psychiatry

## 2017-12-11 DIAGNOSIS — F411 Generalized anxiety disorder: Secondary | ICD-10-CM | POA: Diagnosis not present

## 2017-12-11 DIAGNOSIS — F331 Major depressive disorder, recurrent, moderate: Secondary | ICD-10-CM | POA: Diagnosis not present

## 2017-12-11 NOTE — Progress Notes (Signed)
Patient:  Hailey Owen   DOB: 06/19/1970  MR Number: [id number]  Location: [facility] Center:  [address] [place],  [place],     [phone]             Start: Wednesday 12/11/2017 8:08 AM  End: Wednesday 12/11/2017 9:00 AM    Provider/Observer:     Florencia ReasonsPeggy Himani Corona, MSW, LCSW   Chief Complaint:      Chief Complaint  Patient presents with  . Depression   Reason For Service:      Hailey Angerracey Serano is a 47 y.o. female who Is referred for services by psychiatrist Dr. Lolly MustacheArfeen due to patient experiencing symptoms of anxiety and depression. She is a returning patient to this clinician as she was seen in 2007 - 2008 due to experiencing symptoms of depression.  Patient states losing interest in going anywhere or doing anything. She also reports she has lost her independence and is worried about whether or not she is going to get disability. She expresses worry about effects on her 48 year old mother as she is providing patient with financial support. Patient also worries about having panic attacks and "seizures" and says she doesn't want to go anywhere. She also states being disgusted with self because she can't do things she used to do.  Interventions Strategy:  Supportive  Participation Level:   Active  Participation Quality:  Appropriate      Behavioral Observation:  Casual and Well Groomed, Alert, and Appropriate.   Current Psychosocial Factors: Financial stress, recent conflict with mother  Content of Session:    reviewed symptoms, discussed stressors, facilitated expression of thoughts and feelings, validated feelings, did [facility] case conceptualiztion of patient's pattern of nonassertive behavior when interacting with mother and sister, provided psychoeducation regarding assertiveness( discussed aggressive communication/passive communication/assertive communication), assisted patient identify/challenge/and replace thoughts that inhibited effective assertion, assisted patient identify and practice  ways to improve assertive communication with sister, did role play regarding having assertive communication requesting sister to help with household chores (use I messages, being specific making request without downplaying request), assigned patient to review handout provided in session  Current Status:    anxiety and worry, panic attacks   Suicidal/Homicidal:    No  Patient Progress:   Patient last was seen 2  Weeks ago. She reports less tension in relationship with mother. However, she reports increased stress due to assuming more responsibilities as mother fell and broke her shoulder. 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 Patient expresses frustration,anger, and resentment sister does not help out with household chores. She admits she has not asked sister for help.   Target Goals:   1. Learn and implement behavioral strategies to overcome depression.    2. Learn and implement calming skills to reduce/manage overall anxiety.     3. Learn and implement conflict resolution skills to resolve interpersonal problems.   Last Reviewed:   01/02/2017  Goals Addressed Today:    2,3  Plan:      Return again in 2 weeks.  Impression/Diagnosis:  Patient presents with a long-standing history of symptoms of depression and anxiety. She reports initially experiencing symptoms 20 years ago.  She has had one psychiatric hospitalizaion which occurred in 2007 due to depression and suicidal thoughts. In recent months, symptoms of anxiety have been more prominent.    Diagnosis:  Axis I: Major Depressive Disorder, recurrent, moderate  Axis II: Deferred    Fareeha Evon, LCSW 12/11/2017

## 2017-12-13 DIAGNOSIS — H9011 Conductive hearing loss, unilateral, right ear, with unrestricted hearing on the contralateral side: Secondary | ICD-10-CM | POA: Diagnosis not present

## 2017-12-13 DIAGNOSIS — H6521 Chronic serous otitis media, right ear: Secondary | ICD-10-CM | POA: Diagnosis not present

## 2017-12-13 DIAGNOSIS — H6981 Other specified disorders of Eustachian tube, right ear: Secondary | ICD-10-CM | POA: Diagnosis not present

## 2017-12-16 ENCOUNTER — Encounter

## 2017-12-16 ENCOUNTER — Institutional Professional Consult (permissible substitution): Payer: 59 | Admitting: Neurology

## 2017-12-20 DIAGNOSIS — H9201 Otalgia, right ear: Secondary | ICD-10-CM | POA: Diagnosis not present

## 2017-12-20 DIAGNOSIS — I10 Essential (primary) hypertension: Secondary | ICD-10-CM | POA: Diagnosis not present

## 2017-12-20 DIAGNOSIS — F17210 Nicotine dependence, cigarettes, uncomplicated: Secondary | ICD-10-CM | POA: Diagnosis not present

## 2017-12-20 DIAGNOSIS — E785 Hyperlipidemia, unspecified: Secondary | ICD-10-CM | POA: Diagnosis not present

## 2017-12-20 DIAGNOSIS — R569 Unspecified convulsions: Secondary | ICD-10-CM | POA: Diagnosis not present

## 2017-12-20 DIAGNOSIS — Z299 Encounter for prophylactic measures, unspecified: Secondary | ICD-10-CM | POA: Diagnosis not present

## 2017-12-20 DIAGNOSIS — Z6835 Body mass index (BMI) 35.0-35.9, adult: Secondary | ICD-10-CM | POA: Diagnosis not present

## 2017-12-25 ENCOUNTER — Ambulatory Visit (HOSPITAL_COMMUNITY): Payer: Self-pay | Admitting: Psychiatry

## 2018-01-04 DIAGNOSIS — R05 Cough: Secondary | ICD-10-CM | POA: Diagnosis not present

## 2018-01-04 DIAGNOSIS — J0140 Acute pansinusitis, unspecified: Secondary | ICD-10-CM | POA: Diagnosis not present

## 2018-01-04 DIAGNOSIS — R0981 Nasal congestion: Secondary | ICD-10-CM | POA: Diagnosis not present

## 2018-01-06 DIAGNOSIS — J45909 Unspecified asthma, uncomplicated: Secondary | ICD-10-CM | POA: Diagnosis not present

## 2018-01-06 DIAGNOSIS — I10 Essential (primary) hypertension: Secondary | ICD-10-CM | POA: Diagnosis not present

## 2018-01-06 DIAGNOSIS — R05 Cough: Secondary | ICD-10-CM | POA: Diagnosis not present

## 2018-01-06 DIAGNOSIS — F17210 Nicotine dependence, cigarettes, uncomplicated: Secondary | ICD-10-CM | POA: Diagnosis not present

## 2018-01-06 DIAGNOSIS — Z299 Encounter for prophylactic measures, unspecified: Secondary | ICD-10-CM | POA: Diagnosis not present

## 2018-01-06 DIAGNOSIS — Z6835 Body mass index (BMI) 35.0-35.9, adult: Secondary | ICD-10-CM | POA: Diagnosis not present

## 2018-01-10 DIAGNOSIS — J45909 Unspecified asthma, uncomplicated: Secondary | ICD-10-CM | POA: Diagnosis not present

## 2018-01-10 DIAGNOSIS — F17210 Nicotine dependence, cigarettes, uncomplicated: Secondary | ICD-10-CM | POA: Diagnosis not present

## 2018-01-10 DIAGNOSIS — Z6835 Body mass index (BMI) 35.0-35.9, adult: Secondary | ICD-10-CM | POA: Diagnosis not present

## 2018-01-10 DIAGNOSIS — Z2821 Immunization not carried out because of patient refusal: Secondary | ICD-10-CM | POA: Diagnosis not present

## 2018-01-10 DIAGNOSIS — Z299 Encounter for prophylactic measures, unspecified: Secondary | ICD-10-CM | POA: Diagnosis not present

## 2018-01-10 DIAGNOSIS — I10 Essential (primary) hypertension: Secondary | ICD-10-CM | POA: Diagnosis not present

## 2018-01-15 ENCOUNTER — Encounter (HOSPITAL_COMMUNITY): Payer: Self-pay | Admitting: Psychiatry

## 2018-01-15 ENCOUNTER — Ambulatory Visit (INDEPENDENT_AMBULATORY_CARE_PROVIDER_SITE_OTHER): Payer: Medicare HMO | Admitting: Psychiatry

## 2018-01-15 DIAGNOSIS — F331 Major depressive disorder, recurrent, moderate: Secondary | ICD-10-CM

## 2018-01-15 DIAGNOSIS — F411 Generalized anxiety disorder: Secondary | ICD-10-CM | POA: Diagnosis not present

## 2018-01-15 NOTE — Progress Notes (Signed)
Patient:  Hailey Owen   DOB: 09-Dec-1970  MR Number: [id number]  Location: [facility] Center:  [address] [place],  [place],     [address]             Start: Wednesday 01/15/2018 8:05 AM  End: Wednesday 01/15/2018 8:55 AM     Provider/Observer:     Florencia Reasons, MSW, LCSW   Chief Complaint:      Chief Complaint  Patient presents with  . Depression   Reason For Service:      Hailey Owen is a 47 y.o. female who Is referred for services by psychiatrist Dr. Lolly Mustache due to patient experiencing symptoms of anxiety and depression. She is a returning patient to this clinician as she was seen in 2007 - 2008 due to experiencing symptoms of depression.  Patient states losing interest in going anywhere or doing anything. She also reports she has lost her independence and is worried about whether or not she is going to get disability. She expresses worry about effects on her 64 year old mother as she is providing patient with financial support. Patient also worries about having panic attacks and "seizures" and says she doesn't want to go anywhere. She also states being disgusted with self because she can't do things she used to do.  Interventions Strategy:  Supportive  Participation Level:   Active  Participation Quality:  Appropriate      Behavioral Observation:  Casual and Well Groomed, Alert, and Appropriate.   Current Psychosocial Factors: Financial stress, recent conflict with mother  Content of Session:    reviewed symptoms, administered PHQ-9 and GAD-7, reviewed treatment plan, praised and reinforced patient's use of assertiveness skills in the relationship with mother and sister, discussed effects on patient's mood/behavior/ and the relationship with mother and sister, assisted patient identify thoughts that promote effective assertion, reviewed stress response and using deep breathing to trigger relaxation response, discussed rationale for practicing deep breathing regularly, assigned  patient to review deep breathing and assertiveness handouts provided in previous sessions, assigned patient to practice deep breathing 5-10 minutes 2 x per day  Current Status:    anxiety and worry, panic like symptoms  Suicidal/Homicidal:    No  Patient Progress:   Patient last was seen about a month  ago. She denies any symptoms of depression. She reports continued anxiety but denies having any panic attacks in the past two months. She says she has had panic like symptoms at times but used deep breathing to manage. She reports she has not been practicing deep breathing regularly. She reports much less tension in the relationship with mother and sister. Patient reports being assertive with sister and asking her for help in doing household tasks. Sister had positive response and is helping out per her report. Patient reports being assertive with mother expressing her feelings about relationship with boyfriend and setting limits with mother about her actions/choices regarding the relationship with boyfriend. Mother still doesn't approve of the relationship but has been respectful of patient's limits and choices. Patient reports less arguing and improved relationship with mother. 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    Target Goals:   1. Learn and implement behavioral strategies to overcome depression.    2. Learn and implement calming skills to reduce/manage overall anxiety.     3. Learn and implement conflict resolution skills to resolve interpersonal problems.   Last Reviewed:   01/15/2018  Goals Addressed Today:  2,3  Plan:      Return again in 2 weeks.  Impression/Diagnosis:  Patient presents with a long-standing history of symptoms of depression and anxiety. She reports initially experiencing symptoms 20 years ago.  She has had one psychiatric hospitalizaion which occurred in 2007 due to depression and suicidal thoughts. In recent months, symptoms of  anxiety have been more prominent.    Diagnosis:  Axis I: Major Depressive Disorder, recurrent, moderate          Axis II: Deferred    Agata Lucente, LCSW 01/15/2018

## 2018-01-16 ENCOUNTER — Ambulatory Visit (INDEPENDENT_AMBULATORY_CARE_PROVIDER_SITE_OTHER): Payer: Medicare HMO | Admitting: Otolaryngology

## 2018-01-16 DIAGNOSIS — H6983 Other specified disorders of Eustachian tube, bilateral: Secondary | ICD-10-CM

## 2018-01-16 DIAGNOSIS — H7201 Central perforation of tympanic membrane, right ear: Secondary | ICD-10-CM | POA: Diagnosis not present

## 2018-01-21 ENCOUNTER — Ambulatory Visit (HOSPITAL_COMMUNITY): Payer: Medicare HMO | Admitting: Psychiatry

## 2018-01-21 ENCOUNTER — Encounter (HOSPITAL_COMMUNITY): Payer: Self-pay | Admitting: Psychiatry

## 2018-01-21 DIAGNOSIS — F411 Generalized anxiety disorder: Secondary | ICD-10-CM

## 2018-01-21 DIAGNOSIS — F331 Major depressive disorder, recurrent, moderate: Secondary | ICD-10-CM | POA: Diagnosis not present

## 2018-01-21 DIAGNOSIS — F17210 Nicotine dependence, cigarettes, uncomplicated: Secondary | ICD-10-CM

## 2018-01-21 MED ORDER — VENLAFAXINE HCL ER 150 MG PO CP24: 150.0000 mg | ORAL_CAPSULE | Freq: Every day | ORAL | 0 refills | Status: DC

## 2018-01-21 MED ORDER — ARIPIPRAZOLE 5 MG PO TABS: 5.0000 mg | ORAL_TABLET | Freq: Every day | ORAL | 0 refills | Status: DC

## 2018-01-21 MED ORDER — LAMOTRIGINE 150 MG PO TABS: 150.0000 mg | ORAL_TABLET | Freq: Two times a day (BID) | ORAL | 0 refills | Status: DC

## 2018-01-21 MED ORDER — LORAZEPAM 0.5 MG PO TABS: ORAL_TABLET | ORAL | 2 refills | Status: DC
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
## 2018-01-29 ENCOUNTER — Ambulatory Visit (INDEPENDENT_AMBULATORY_CARE_PROVIDER_SITE_OTHER): Payer: Medicare HMO | Admitting: Psychiatry

## 2018-01-29 DIAGNOSIS — F331 Major depressive disorder, recurrent, moderate: Secondary | ICD-10-CM

## 2018-01-29 DIAGNOSIS — F411 Generalized anxiety disorder: Secondary | ICD-10-CM | POA: Diagnosis not present

## 2018-01-29 NOTE — Progress Notes (Signed)
Patient:  Hailey Owen   DOB: May 14, 1970  MR Number: [id number]  [facility]:  [address] [place] [place],  [place],     [address]             Start: Wednesday 01/29/2018 8:08 AM End: Wednesday 01/29/2018  8:50 AM     Provider/Observer:     Florencia Reasons, MSW, LCSW   Chief Complaint:      Chief Complaint  Patient presents with  . Depression   Reason For Service:      Hailey Owen is a 47 y.o. female who Is referred for services by psychiatrist Dr. Lolly Mustache due to patient experiencing symptoms of anxiety and depression. She is a returning patient to this clinician as she was seen in 2007 - 2008 due to experiencing symptoms of depression.  Patient states losing interest in going anywhere or doing anything. She also reports she has lost her independence and is worried about whether or not she is going to get disability. She expresses worry about effects on her 60 year old mother as she is providing patient with financial support. Patient also worries about having panic attacks and "seizures" and says she doesn't want to go anywhere. She also states being disgusted with self because she can't do things she used to do.  Interventions Strategy:  Supportive  Participation Level:   Active  Participation Quality:  Appropriate      Behavioral Observation:  Casual and Well Groomed, Alert, and Appropriate.   Current Psychosocial Factors: Financial stress, recent conflict with mother, stress in relationship with mother  Content of Session:    praised and reinforced patient's continued use of assertiveness skills in relationship with mother and sisterdiscussed stressors, facilitated expression of thoughts and feelings, validated feelings, assisted patient identify her expectations of relationship with boyfriend, reviewed thoughts that inhibit effective assertion and replaced with thoughts that promote effective assertion, discussed ways to improve assertiveness skills with the use of "I"  messages in the relationship with her boyfriend, also assisted patient identify ways to set limits in the relationship regarding finances and time, provided patient with handouts on assertiveness and a self-assessment, asked her read/complete bring to next session, assigned patient to continue practicing deep breathing,  Current Status:    anxiety and worry, panic like symptoms  Suicidal/Homicidal:    No  Patient Progress:   Patient last was seen about 3 weeks ago. She has a headache that started last night per her report. She says her mother has been having issues with her heart for the past few weeks and has a history of Afib. Patient expresses worry about mother. She reports additional stress regarding relationship with boyfriend as he hasn't made efforts to get a job. She also reports boyfriend has become more dependent on her since his mother died 2 weeks ago. He has no family and no friends per patient's report and wants to be with her a lot. Patient reports feeling pressured by the relationship and expresses hurt, anger, and frustsration.  Target Goals:   1. Learn and implement behavioral strategies to overcome depression.    2. Learn and implement calming skills to reduce/manage overall anxiety.     3. Learn and implement conflict resolution skills to resolve interpersonal problems.   Last Reviewed:   01/15/2018  Goals Addressed Today:    2,3  Plan:      Return again in 2 weeks.  Impression/Diagnosis:  Patient presents with a long-standing history of symptoms of depression and anxiety. She  reports initially experiencing symptoms 20 years ago.  She has had one psychiatric hospitalizaion which occurred in 2007 due to depression and suicidal thoughts. In recent months, symptoms of anxiety have been more prominent.    Diagnosis:  Axis I: Major Depressive Disorder, recurrent, moderate          Axis II: Deferred    Sanayah Munro, LCSW 01/29/2018

## 2018-02-05 DIAGNOSIS — Z2821 Immunization not carried out because of patient refusal: Secondary | ICD-10-CM | POA: Diagnosis not present

## 2018-02-05 DIAGNOSIS — R569 Unspecified convulsions: Secondary | ICD-10-CM | POA: Diagnosis not present

## 2018-02-05 DIAGNOSIS — Z299 Encounter for prophylactic measures, unspecified: Secondary | ICD-10-CM | POA: Diagnosis not present

## 2018-02-05 DIAGNOSIS — Z6835 Body mass index (BMI) 35.0-35.9, adult: Secondary | ICD-10-CM | POA: Diagnosis not present

## 2018-02-05 DIAGNOSIS — I10 Essential (primary) hypertension: Secondary | ICD-10-CM | POA: Diagnosis not present

## 2018-02-05 DIAGNOSIS — F17200 Nicotine dependence, unspecified, uncomplicated: Secondary | ICD-10-CM | POA: Diagnosis not present

## 2018-02-12 ENCOUNTER — Ambulatory Visit (HOSPITAL_COMMUNITY): Payer: Self-pay | Admitting: Psychiatry

## 2018-02-26 ENCOUNTER — Ambulatory Visit (INDEPENDENT_AMBULATORY_CARE_PROVIDER_SITE_OTHER): Payer: Medicare HMO | Admitting: Psychiatry

## 2018-02-26 ENCOUNTER — Encounter (HOSPITAL_COMMUNITY): Payer: Self-pay | Admitting: Psychiatry

## 2018-02-26 DIAGNOSIS — F411 Generalized anxiety disorder: Secondary | ICD-10-CM | POA: Diagnosis not present

## 2018-02-26 DIAGNOSIS — F331 Major depressive disorder, recurrent, moderate: Secondary | ICD-10-CM | POA: Diagnosis not present

## 2018-02-26 NOTE — Progress Notes (Signed)
Patient:  Hailey Owen   DOB: [birth year]  MR Number: [id number]  Location: [facility]:  [address] [place] [place],  [place],     [address]             Start: Wednesday 02/26/2018 8:14 AM End: Wednesday 02/25/2018 9:03 AM      Provider/Observer:     Florencia Reasons, MSW, LCSW   Chief Complaint:      Chief Complaint  Patient presents with  . Depression   Reason For Service:      Hailey Owen is a 47 y.o. female who Is referred for services by psychiatrist Dr. Lolly Mustache due to patient experiencing symptoms of anxiety and depression. She is a returning patient to this clinician as she was seen in 2007 - 2008 due to experiencing symptoms of depression.  Patient states losing interest in going anywhere or doing anything. She also reports she has lost her independence and is worried about whether or not she is going to get disability. She expresses worry about effects on her 90 year old mother as she is providing patient with financial support. Patient also worries about having panic attacks and "seizures" and says she doesn't want to go anywhere. She also states being disgusted with self because she can't do things she used to do.  Interventions Strategy:  Supportive  Participation Level:   Active  Participation Quality:  Appropriate      Behavioral Observation:  Casual and Well Groomed, Alert, and Appropriate.   Current Psychosocial Factors: Financial stress, recent conflict with mother, stress in relationship with mother  Content of Session:    reviewed symptoms, praised and reinforced patient's continued efforts to improve assertiveness skills in relationship with mother, assisted patient identify her patterns in relationship with mother along with the costs and benefits, discussed patient's self-assessment of assertiveness skills, discussed reasons for unassertive behavior, assisted patient identify her most prevalent reasons ( self-defeating beliefs, anxiety and stress), reviewed deep  breathing as a relaxation technique, discussed and practiced imagery and progressive muscle relaxation as other techniques to manage and reduce anxiety/stress response, developed plan with patient to practice a relaxation technique every night at bedtime  Current Status:    anxiety and worry, panic like symptoms  Suicidal/Homicidal:    No  Patient Progress:   Patient last was seen about 3 weeks ago. She states everything has been good. She says she was experiencing anxiety as she was "sneaking behind mother's back" to see boyfriend. However, she reports asking mother if boyfriend could visit her at [employer]. She reports mother said yes to her request. Patient reports deciding not to say anything to boyfriend about finances and time as he is going to prison on November 19th for probation violation. Patient states knowing she will miss boyfriend but accepting it ane experiencing some relief.   Target Goals:   1. Learn and implement behavioral strategies to overcome depression.    2. Learn and implement calming skills to reduce/manage overall anxiety.     3. Learn and implement conflict resolution skills to resolve interpersonal problems.   Last Reviewed:   01/15/2018  Goals Addressed Today:    2,3  Plan:      Return again in 2 weeks.       Impression/Diagnosis:  Patient presents with a long-standing history of symptoms of depression and anxiety. She reports initially experiencing symptoms 20 years ago.  She has had one psychiatric hospitalizaion which occurred in 2007 due to depression and suicidal thoughts. In recent  months, symptoms of anxiety have been more prominent.    Diagnosis:  Axis I: Major Depressive Disorder, recurrent, moderate          Axis II: Deferred    Jenniefer Salak, LCSW 02/26/2018

## 2018-03-07 ENCOUNTER — Ambulatory Visit (HOSPITAL_COMMUNITY): Payer: Medicare HMO | Admitting: Psychiatry

## 2018-03-28 DIAGNOSIS — I10 Essential (primary) hypertension: Secondary | ICD-10-CM | POA: Diagnosis not present

## 2018-03-28 DIAGNOSIS — Z299 Encounter for prophylactic measures, unspecified: Secondary | ICD-10-CM | POA: Diagnosis not present

## 2018-03-28 DIAGNOSIS — F17210 Nicotine dependence, cigarettes, uncomplicated: Secondary | ICD-10-CM | POA: Diagnosis not present

## 2018-03-28 DIAGNOSIS — J40 Bronchitis, not specified as acute or chronic: Secondary | ICD-10-CM | POA: Diagnosis not present

## 2018-03-28 DIAGNOSIS — Z6834 Body mass index (BMI) 34.0-34.9, adult: Secondary | ICD-10-CM | POA: Diagnosis not present

## 2018-04-02 ENCOUNTER — Ambulatory Visit (HOSPITAL_COMMUNITY): Payer: Self-pay | Admitting: Psychiatry

## 2018-04-29 ENCOUNTER — Ambulatory Visit (HOSPITAL_COMMUNITY): Payer: Medicare HMO | Admitting: Psychiatry

## 2018-04-29 DIAGNOSIS — F411 Generalized anxiety disorder: Secondary | ICD-10-CM | POA: Diagnosis not present

## 2018-04-29 DIAGNOSIS — F331 Major depressive disorder, recurrent, moderate: Secondary | ICD-10-CM

## 2018-04-29 MED ORDER — ARIPIPRAZOLE 5 MG PO TABS: 5.0000 mg | ORAL_TABLET | Freq: Every day | ORAL | 0 refills | Status: DC

## 2018-04-29 MED ORDER — LORAZEPAM 0.5 MG PO TABS: ORAL_TABLET | ORAL | 2 refills | Status: DC

## 2018-04-29 MED ORDER — LAMOTRIGINE 150 MG PO TABS: 150.0000 mg | ORAL_TABLET | Freq: Two times a day (BID) | ORAL | 0 refills | Status: DC

## 2018-04-29 MED ORDER — VENLAFAXINE HCL ER 150 MG PO CP24: 150.0000 mg | ORAL_CAPSULE | Freq: Every day | ORAL | 0 refills | Status: DC
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
## 2018-05-06 ENCOUNTER — Ambulatory Visit (HOSPITAL_COMMUNITY): Payer: Medicare HMO | Admitting: Psychiatry

## 2018-05-08 DIAGNOSIS — Z299 Encounter for prophylactic measures, unspecified: Secondary | ICD-10-CM | POA: Diagnosis not present

## 2018-05-08 DIAGNOSIS — I10 Essential (primary) hypertension: Secondary | ICD-10-CM | POA: Diagnosis not present

## 2018-05-08 DIAGNOSIS — R569 Unspecified convulsions: Secondary | ICD-10-CM | POA: Diagnosis not present

## 2018-05-08 DIAGNOSIS — F17210 Nicotine dependence, cigarettes, uncomplicated: Secondary | ICD-10-CM | POA: Diagnosis not present

## 2018-05-08 DIAGNOSIS — J069 Acute upper respiratory infection, unspecified: Secondary | ICD-10-CM | POA: Diagnosis not present

## 2018-05-08 DIAGNOSIS — Z6834 Body mass index (BMI) 34.0-34.9, adult: Secondary | ICD-10-CM | POA: Diagnosis not present

## 2018-05-15 DIAGNOSIS — Z299 Encounter for prophylactic measures, unspecified: Secondary | ICD-10-CM | POA: Diagnosis not present

## 2018-05-15 DIAGNOSIS — F17210 Nicotine dependence, cigarettes, uncomplicated: Secondary | ICD-10-CM | POA: Diagnosis not present

## 2018-05-15 DIAGNOSIS — R569 Unspecified convulsions: Secondary | ICD-10-CM | POA: Diagnosis not present

## 2018-05-15 DIAGNOSIS — I10 Essential (primary) hypertension: Secondary | ICD-10-CM | POA: Diagnosis not present

## 2018-05-15 DIAGNOSIS — H6980 Other specified disorders of Eustachian tube, unspecified ear: Secondary | ICD-10-CM | POA: Diagnosis not present

## 2018-05-15 DIAGNOSIS — Z6834 Body mass index (BMI) 34.0-34.9, adult: Secondary | ICD-10-CM | POA: Diagnosis not present

## 2018-05-26 DIAGNOSIS — E785 Hyperlipidemia, unspecified: Secondary | ICD-10-CM | POA: Diagnosis not present

## 2018-05-26 DIAGNOSIS — Z299 Encounter for prophylactic measures, unspecified: Secondary | ICD-10-CM | POA: Diagnosis not present

## 2018-05-26 DIAGNOSIS — Z0000 Encounter for general adult medical examination without abnormal findings: Secondary | ICD-10-CM | POA: Diagnosis not present

## 2018-05-26 DIAGNOSIS — R5383 Other fatigue: Secondary | ICD-10-CM | POA: Diagnosis not present

## 2018-05-26 DIAGNOSIS — Z6834 Body mass index (BMI) 34.0-34.9, adult: Secondary | ICD-10-CM | POA: Diagnosis not present

## 2018-05-26 DIAGNOSIS — Z1331 Encounter for screening for depression: Secondary | ICD-10-CM | POA: Diagnosis not present

## 2018-05-26 DIAGNOSIS — Z7189 Other specified counseling: Secondary | ICD-10-CM | POA: Diagnosis not present

## 2018-05-26 DIAGNOSIS — Z1339 Encounter for screening examination for other mental health and behavioral disorders: Secondary | ICD-10-CM | POA: Diagnosis not present

## 2018-05-26 DIAGNOSIS — I10 Essential (primary) hypertension: Secondary | ICD-10-CM | POA: Diagnosis not present

## 2018-05-27 DIAGNOSIS — R5383 Other fatigue: Secondary | ICD-10-CM | POA: Diagnosis not present

## 2018-05-27 DIAGNOSIS — E785 Hyperlipidemia, unspecified: Secondary | ICD-10-CM | POA: Diagnosis not present

## 2018-05-27 DIAGNOSIS — Z79899 Other long term (current) drug therapy: Secondary | ICD-10-CM | POA: Diagnosis not present

## 2018-06-16 ENCOUNTER — Ambulatory Visit (HOSPITAL_COMMUNITY): Payer: Medicare HMO | Admitting: Psychiatry

## 2018-06-19 DIAGNOSIS — R569 Unspecified convulsions: Secondary | ICD-10-CM | POA: Diagnosis not present

## 2018-06-19 DIAGNOSIS — H6691 Otitis media, unspecified, right ear: Secondary | ICD-10-CM | POA: Diagnosis not present

## 2018-06-19 DIAGNOSIS — F17210 Nicotine dependence, cigarettes, uncomplicated: Secondary | ICD-10-CM | POA: Diagnosis not present

## 2018-06-19 DIAGNOSIS — Z299 Encounter for prophylactic measures, unspecified: Secondary | ICD-10-CM | POA: Diagnosis not present

## 2018-06-19 DIAGNOSIS — I10 Essential (primary) hypertension: Secondary | ICD-10-CM | POA: Diagnosis not present

## 2018-06-19 DIAGNOSIS — Z6834 Body mass index (BMI) 34.0-34.9, adult: Secondary | ICD-10-CM | POA: Diagnosis not present

## 2018-07-02 ENCOUNTER — Ambulatory Visit (HOSPITAL_COMMUNITY): Payer: Medicare HMO | Admitting: Psychiatry

## 2018-07-02 ENCOUNTER — Encounter (HOSPITAL_COMMUNITY): Payer: Self-pay | Admitting: Psychiatry

## 2018-07-02 ENCOUNTER — Other Ambulatory Visit: Payer: Self-pay

## 2018-07-02 DIAGNOSIS — F331 Major depressive disorder, recurrent, moderate: Secondary | ICD-10-CM

## 2018-07-02 DIAGNOSIS — F411 Generalized anxiety disorder: Secondary | ICD-10-CM | POA: Diagnosis not present

## 2018-07-02 NOTE — Progress Notes (Signed)
   THERAPIST PROGRESS NOTE  Session Time: Wednesday 07/02/2018 10:10 AM - 10:55 AM  Participation Level: Active  Behavioral Response: CasualAlertAnxious  Type of Therapy: Individual Therapy  Treatment Goals addressed:    Learn and implement conflict resolution skills to resolve interpersonal problems. Interventions: CBT and Supportive  Summary: Hailey Owen is a 48 y.o. female  who Is referred for services by psychiatrist Dr. Lolly Mustache due to patient experiencing symptoms of anxiety and depression. She is a returning patient to this clinician as she was seen in 2007 - 2008 due to experiencing symptoms of depression.    Patient last was seen in November 2019. She missed appointments due to scheduling conflicts. She reports she has experienced panic like symptoms but denies having any panic attacks for the past 3 months. She reports no symptoms of depression. Her boyfriend has been discharged from prison and now has a job. She reports decreased stress about the relationship as mother is now more accepting and allows boyfriend to visit patient at their home. She also reports she boyfriend, and her mother attend church together. She reports increased stress regarding her sister as she has resumed residing with patient and their mother. Per patient's report, there is increased conflict and tension in the home as a result.  Interventions Strategy:                    Supportive  Suicidal/Homicidal: Nowithout intent/plan  Therapist Response: reviewed symptoms, discussed stressors, facilitated expression of thoughts and feelings, validated feelings, provided psychoeducation on assertive behavior including one assertive behavior is not (aggressive, passive) discussed ways to improve assertive communication, provided with patient with handout to review for next session  Plan: Return again in 2 weeks.  Diagnosis: Axis I: MDD    Axis II: No diagnosis    Adah Salvage, LCSW 07/02/2018

## 2018-07-10 ENCOUNTER — Ambulatory Visit (INDEPENDENT_AMBULATORY_CARE_PROVIDER_SITE_OTHER): Payer: Medicare HMO | Admitting: Otolaryngology

## 2018-07-10 DIAGNOSIS — H9011 Conductive hearing loss, unilateral, right ear, with unrestricted hearing on the contralateral side: Secondary | ICD-10-CM

## 2018-07-10 DIAGNOSIS — H6981 Other specified disorders of Eustachian tube, right ear: Secondary | ICD-10-CM | POA: Diagnosis not present

## 2018-07-10 DIAGNOSIS — H6121 Impacted cerumen, right ear: Secondary | ICD-10-CM | POA: Diagnosis not present

## 2018-07-29 ENCOUNTER — Ambulatory Visit (INDEPENDENT_AMBULATORY_CARE_PROVIDER_SITE_OTHER): Payer: Medicare HMO | Admitting: Psychiatry

## 2018-07-29 ENCOUNTER — Other Ambulatory Visit: Payer: Self-pay

## 2018-07-29 DIAGNOSIS — Z79899 Other long term (current) drug therapy: Secondary | ICD-10-CM | POA: Diagnosis not present

## 2018-07-29 DIAGNOSIS — F411 Generalized anxiety disorder: Secondary | ICD-10-CM

## 2018-07-29 DIAGNOSIS — F331 Major depressive disorder, recurrent, moderate: Secondary | ICD-10-CM | POA: Diagnosis not present

## 2018-07-29 MED ORDER — ARIPIPRAZOLE 5 MG PO TABS: 5.0000 mg | ORAL_TABLET | Freq: Every day | ORAL | 0 refills | Status: DC

## 2018-07-29 MED ORDER — VENLAFAXINE HCL ER 150 MG PO CP24: 150.0000 mg | ORAL_CAPSULE | Freq: Every day | ORAL | 0 refills | Status: DC

## 2018-07-29 MED ORDER — LAMOTRIGINE 150 MG PO TABS: 150.0000 mg | ORAL_TABLET | Freq: Two times a day (BID) | ORAL | 0 refills | Status: DC

## 2018-07-29 MED ORDER — LORAZEPAM 0.5 MG PO TABS: ORAL_TABLET | ORAL | 2 refills | Status: DC
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
## 2018-08-05 ENCOUNTER — Other Ambulatory Visit: Payer: Self-pay

## 2018-08-05 ENCOUNTER — Ambulatory Visit (INDEPENDENT_AMBULATORY_CARE_PROVIDER_SITE_OTHER): Payer: Medicare HMO | Admitting: Psychiatry

## 2018-08-05 DIAGNOSIS — F411 Generalized anxiety disorder: Secondary | ICD-10-CM

## 2018-08-05 DIAGNOSIS — F331 Major depressive disorder, recurrent, moderate: Secondary | ICD-10-CM

## 2018-08-05 NOTE — Progress Notes (Signed)
Virtual Visit via Telephone Note  I connected with Hailey Owen on 08/05/18 at 11:00 AM EDT by telephone and verified that I am speaking with the correct person using two identifiers.   I discussed the limitations, risks, security and privacy concerns of performing an evaluation and management service by telephone and the availability of in person appointments. I also discussed with the patient that there may be a patient responsible charge related to this service. The patient expressed understanding and agreed to proceed.   I provided  40 minutes of non-face-to-face time during this encounter.   Adah Salvage, LCSW    THERAPIST PROGRESS NOTE  Session Time: Tuesday 08/05/2018 11:00 AM - 11:40 AM  Participation Level: Active  Behavioral Response: CasualAlertAnxious  Type of Therapy: Individual Therapy  Treatment Goals addressed: Learn and implement conflict resolution skills to resolve interpersonal problems.  Interventions: CBT and Supportive  Summary: Hailey Owen is a 48 y.o. female  who Is referred for services by psychiatrist Dr. Lolly Mustache due to patient experiencing symptoms of anxiety and depression. She is a returning patient to this clinician as she was seen in 2007 - 2008 due to experiencing symptoms of depression.    Patient last was seen about a month ago. She increased stress, depressed mood, poor motivation, and decreased interest in activities.  She expresses frustration due to restrictions associated with the impact of the coronavirus.  She reports sleeping excessively, overeating, and being bored.  She reports little routine or structure in her day.  She reports things are well in the relationships with boyfriend and her mother.  She expresses frustration and resentment regarding her sister as patient states she does not help her around the house.  Patient states giving up on being assertive with sister.       Suicidal/Homicidal: Nowithout intent/plan  Therapist Response:  reviewed symptoms, discussed stressors, facilitated expression of thoughts and feelings, validated feelings, discussed the role of behavioral activation in managing depression, assisted patient identify ways to improve structure and routine, developed plan with patient to have consistent bedtime and wake up time (10:30 PM through 8:30 AM), discussed using I Messages to express concerns and feelings to sister, assigned patient to review assertiveness handout regarding assertiveness tips   Plan: Return again in 2 weeks.  Diagnosis: Axis I: MDD    Axis II: No diagnosis    Adah Salvage, LCSW 08/05/2018

## 2018-08-19 ENCOUNTER — Other Ambulatory Visit: Payer: Self-pay

## 2018-08-19 ENCOUNTER — Ambulatory Visit (HOSPITAL_COMMUNITY): Payer: Medicare HMO | Admitting: Psychiatry

## 2018-08-21 ENCOUNTER — Other Ambulatory Visit: Payer: Self-pay | Admitting: Otolaryngology

## 2018-09-02 ENCOUNTER — Ambulatory Visit (INDEPENDENT_AMBULATORY_CARE_PROVIDER_SITE_OTHER): Payer: Medicare HMO | Admitting: Psychiatry

## 2018-09-02 ENCOUNTER — Other Ambulatory Visit: Payer: Self-pay

## 2018-09-02 DIAGNOSIS — F331 Major depressive disorder, recurrent, moderate: Secondary | ICD-10-CM | POA: Diagnosis not present

## 2018-09-02 DIAGNOSIS — F411 Generalized anxiety disorder: Secondary | ICD-10-CM | POA: Diagnosis not present

## 2018-09-02 NOTE — Progress Notes (Signed)
Virtual Visit via Telephone Note  I connected with Hailey Owen on 09/02/18 at 11:15 AM EDT by telephone and verified that I am speaking with the correct person using two identifiers.   I discussed the limitations, risks, security and privacy concerns of performing an evaluation and management service by telephone and the availability of in person appointments. I also discussed with the patient that there may be a patient responsible charge related to this service. The patient expressed understanding and agreed to proceed.  I provided  minutes of non-face-to-face time during this encounter.   Adah Salvage, LCSW     THERAPIST PROGRESS NOTE  Session Time: Tuesday 09/02/2018 11:15 AM -  11:50 AM    Participation Level: Active  Behavioral Response: CasualAlertAnxious  Type of Therapy: Individual Therapy  Treatment Goals addressed: Learn and implement conflict resolution skills to resolve interpersonal problems.  Interventions: CBT and Supportive  Summary: Hailey Owen is a 48 y.o. female  who Is referred for services by psychiatrist Dr. Lolly Mustache due to patient experiencing symptoms of anxiety and depression. She is a returning patient to this clinician as she was seen in 2007 - 2008 due to experiencing symptoms of depression.    Patient last was seen about 4 weeks ago. She reports continued depressed mood, poor motivation, and decreased interest in activities.  She expresses continued  frustration due to restrictions associated with the impact of the coronavirus.  She continues to sleep excessively but did implement part of plan to go to bed by 10:30. She reports poor sleep quality saying she tosses and turns. She continues to report little routine or structure in her day.  She reports things are well in the relationships with boyfriend and her mother.  She reports things are better with her sister.Suicidal/Homicidal: Nowithout intent/plan  Therapist Response: reviewed symptoms, discussed  stressors, facilitated expression of thoughts and feelings, validated feelings,reviewed the role of behavioral activation in managing depression, assisted patient identify the connection between thoughts of not feeling motivated and her behavior of staying in bed, assisted patient identify ways to relate to thoughts differently,  developed plan with patient to have consistent bedtime and wake up time (10:30 PM through 8:30 AM), also developed plan with patient for her to walk MWF for about an hour, assisted patient identify and address any thoughts or processes that would inhibit completion of plans, assisted patient develop list of benefits for completing plan and consequences for not implementing plans,assigned patient to  review daily   Plan: Return again in 2 weeks.  Diagnosis: Axis I: MDD    Axis II: No diagnosis    Adah Salvage, LCSW 09/02/2018

## 2018-09-04 DIAGNOSIS — I10 Essential (primary) hypertension: Secondary | ICD-10-CM | POA: Diagnosis not present

## 2018-09-04 DIAGNOSIS — E785 Hyperlipidemia, unspecified: Secondary | ICD-10-CM | POA: Diagnosis not present

## 2018-09-04 DIAGNOSIS — Z299 Encounter for prophylactic measures, unspecified: Secondary | ICD-10-CM | POA: Diagnosis not present

## 2018-09-04 DIAGNOSIS — F17210 Nicotine dependence, cigarettes, uncomplicated: Secondary | ICD-10-CM | POA: Diagnosis not present

## 2018-09-04 DIAGNOSIS — Z6835 Body mass index (BMI) 35.0-35.9, adult: Secondary | ICD-10-CM | POA: Diagnosis not present

## 2018-09-23 ENCOUNTER — Encounter (HOSPITAL_BASED_OUTPATIENT_CLINIC_OR_DEPARTMENT_OTHER): Payer: Self-pay | Admitting: *Deleted

## 2018-09-23 ENCOUNTER — Other Ambulatory Visit: Payer: Self-pay

## 2018-09-26 ENCOUNTER — Encounter (HOSPITAL_BASED_OUTPATIENT_CLINIC_OR_DEPARTMENT_OTHER)
Admission: RE | Admit: 2018-09-26 | Discharge: 2018-09-26 | Disposition: A | Discharge status: Home / Self Care | Payer: Medicare HMO | Source: Ambulatory Visit | Attending: Otolaryngology | Admitting: Otolaryngology

## 2018-09-26 ENCOUNTER — Other Ambulatory Visit: Payer: Self-pay

## 2018-09-26 ENCOUNTER — Other Ambulatory Visit (HOSPITAL_COMMUNITY)
Admission: RE | Admit: 2018-09-26 | Discharge: 2018-09-26 | Disposition: A | Discharge status: Home / Self Care | Payer: Medicare HMO | Source: Ambulatory Visit | Attending: Otolaryngology | Admitting: Otolaryngology

## 2018-09-26 DIAGNOSIS — Z1159 Encounter for screening for other viral diseases: Secondary | ICD-10-CM | POA: Diagnosis not present

## 2018-09-26 DIAGNOSIS — I10 Essential (primary) hypertension: Secondary | ICD-10-CM | POA: Diagnosis not present

## 2018-09-26 DIAGNOSIS — Z01812 Encounter for preprocedural laboratory examination: Secondary | ICD-10-CM | POA: Diagnosis not present

## 2018-09-26 DIAGNOSIS — Z01818 Encounter for other preprocedural examination: Secondary | ICD-10-CM | POA: Insufficient documentation

## 2018-09-26 LAB — BASIC METABOLIC PANEL
Anion gap: 10 (ref 5–15)
BUN: 12 mg/dL (ref 6–20)
CO2: 23 mmol/L (ref 22–32)
Calcium: 9.6 mg/dL (ref 8.9–10.3)
Chloride: 103 mmol/L (ref 98–111)
Creatinine, Ser: 0.75 mg/dL (ref 0.44–1.00)
GFR calc Af Amer: >60 mL/min (ref >60)
GFR calc non Af Amer: >60 mL/min (ref >60)
Glucose, Bld: 96 mg/dL (ref 70–99)
Potassium: 4.7 mmol/L (ref 3.5–5.1)
Sodium: 136 mmol/L (ref 135–145)

## 2018-09-27 LAB — NOVEL CORONAVIRUS, NAA (HOSP ORDER, SEND-OUT TO REF LAB; TAT 18-24 HRS): SARS-CoV-2, NAA: NOT DETECTED

## 2018-09-30 ENCOUNTER — Ambulatory Visit (HOSPITAL_BASED_OUTPATIENT_CLINIC_OR_DEPARTMENT_OTHER): Payer: Medicare HMO | Admitting: Anesthesiology

## 2018-09-30 ENCOUNTER — Encounter (HOSPITAL_BASED_OUTPATIENT_CLINIC_OR_DEPARTMENT_OTHER)
Admission: RE | Disposition: A | Discharge status: Home / Self Care | Payer: Self-pay | Source: Home / Self Care | Attending: Otolaryngology

## 2018-09-30 ENCOUNTER — Other Ambulatory Visit: Payer: Self-pay

## 2018-09-30 ENCOUNTER — Encounter (HOSPITAL_BASED_OUTPATIENT_CLINIC_OR_DEPARTMENT_OTHER): Payer: Self-pay

## 2018-09-30 ENCOUNTER — Ambulatory Visit (HOSPITAL_BASED_OUTPATIENT_CLINIC_OR_DEPARTMENT_OTHER)
Admission: RE | Admit: 2018-09-30 | Discharge: 2018-09-30 | Disposition: A | Discharge status: Home / Self Care | Payer: Medicare HMO | Attending: Otolaryngology | Admitting: Otolaryngology

## 2018-09-30 DIAGNOSIS — F419 Anxiety disorder, unspecified: Secondary | ICD-10-CM | POA: Diagnosis not present

## 2018-09-30 DIAGNOSIS — H6983 Other specified disorders of Eustachian tube, bilateral: Secondary | ICD-10-CM | POA: Diagnosis not present

## 2018-09-30 DIAGNOSIS — K219 Gastro-esophageal reflux disease without esophagitis: Secondary | ICD-10-CM | POA: Insufficient documentation

## 2018-09-30 DIAGNOSIS — H748X3 Other specified disorders of middle ear and mastoid, bilateral: Secondary | ICD-10-CM | POA: Diagnosis not present

## 2018-09-30 DIAGNOSIS — H9193 Unspecified hearing loss, bilateral: Secondary | ICD-10-CM | POA: Insufficient documentation

## 2018-09-30 DIAGNOSIS — F17200 Nicotine dependence, unspecified, uncomplicated: Secondary | ICD-10-CM | POA: Diagnosis not present

## 2018-09-30 DIAGNOSIS — F329 Major depressive disorder, single episode, unspecified: Secondary | ICD-10-CM | POA: Diagnosis not present

## 2018-09-30 DIAGNOSIS — G4730 Sleep apnea, unspecified: Secondary | ICD-10-CM | POA: Insufficient documentation

## 2018-09-30 DIAGNOSIS — H6523 Chronic serous otitis media, bilateral: Secondary | ICD-10-CM | POA: Diagnosis not present

## 2018-09-30 DIAGNOSIS — F418 Other specified anxiety disorders: Secondary | ICD-10-CM | POA: Diagnosis not present

## 2018-09-30 DIAGNOSIS — H7580 Other specified disorders of middle ear and mastoid in diseases classified elsewhere, unspecified ear: Secondary | ICD-10-CM | POA: Diagnosis not present

## 2018-09-30 HISTORY — DX: Sleep apnea, unspecified: G47.30

## 2018-09-30 HISTORY — PX: MYRINGOTOMY WITH TUBE PLACEMENT: SHX5663

## 2018-09-30 SURGERY — MYRINGOTOMY WITH TUBE PLACEMENT
Anesthesia: Monitor Anesthesia Care | Site: Ear | Laterality: Right

## 2018-09-30 MED ORDER — MIDAZOLAM HCL 2 MG/2ML IJ SOLN
INTRAMUSCULAR | Status: AC
  Filled 2018-09-30: qty 2

## 2018-09-30 MED ORDER — FENTANYL CITRATE (PF) 100 MCG/2ML IJ SOLN
INTRAMUSCULAR | Status: AC
  Filled 2018-09-30: qty 2

## 2018-09-30 MED ORDER — ONDANSETRON HCL 4 MG/2ML IJ SOLN
INTRAMUSCULAR | Status: DC | PRN
  Administered 2018-09-30: 4 mg via INTRAVENOUS

## 2018-09-30 MED ORDER — SCOPOLAMINE 1 MG/3DAYS TD PT72: 1.0000 | MEDICATED_PATCH | Freq: Once | TRANSDERMAL | Status: DC | PRN

## 2018-09-30 MED ORDER — PROPOFOL 500 MG/50ML IV EMUL
INTRAVENOUS | Status: DC | PRN
  Administered 2018-09-30: 100 ug/kg/min via INTRAVENOUS

## 2018-09-30 MED ORDER — OXYMETAZOLINE HCL 0.05 % NA SOLN
NASAL | Status: DC | PRN
  Administered 2018-09-30: 1

## 2018-09-30 MED ORDER — MIDAZOLAM HCL 2 MG/2ML IJ SOLN
1.0000 mg | INTRAMUSCULAR | Status: DC | PRN
  Administered 2018-09-30: 2 mg via INTRAVENOUS

## 2018-09-30 MED ORDER — PROPOFOL 10 MG/ML IV BOLUS
INTRAVENOUS | Status: DC | PRN
  Administered 2018-09-30: 20 mg via INTRAVENOUS
  Administered 2018-09-30: 50 mg via INTRAVENOUS

## 2018-09-30 MED ORDER — LACTATED RINGERS IV SOLN
INTRAVENOUS | Status: DC
  Administered 2018-09-30: 08:00:00 via INTRAVENOUS

## 2018-09-30 MED ORDER — FENTANYL CITRATE (PF) 100 MCG/2ML IJ SOLN
50.0000 ug | INTRAMUSCULAR | Status: DC | PRN
  Administered 2018-09-30: 100 ug via INTRAVENOUS

## 2018-09-30 MED ORDER — CIPROFLOXACIN-FLUOCINOLONE PF 0.3-0.025 % OT SOLN
OTIC | Status: DC | PRN
  Administered 2018-09-30: 0.25 mL via OTIC

## 2018-09-30 SURGICAL SUPPLY — 10 items
BLADE MYRINGOTOMY 45DEG STRL (BLADE) ×2 IMPLANT
CANISTER SUCT 1200ML W/VALVE (MISCELLANEOUS) ×2 IMPLANT
COTTONBALL LRG STERILE PKG (GAUZE/BANDAGES/DRESSINGS) ×2 IMPLANT
GAUZE SPONGE 4X4 12PLY STRL LF (GAUZE/BANDAGES/DRESSINGS) IMPLANT
IV SET EXT 30 76VOL 4 MALE LL (IV SETS) ×2 IMPLANT
NS IRRIG 1000ML POUR BTL (IV SOLUTION) ×2 IMPLANT
TOWEL GREEN STERILE FF (TOWEL DISPOSABLE) ×2 IMPLANT
TUBE CONNECTING 20X1/4 (TUBING) ×2 IMPLANT
TUBE EAR SHEEHY BUTTON 1.27 (OTOLOGIC RELATED) ×4 IMPLANT
TUBE EAR T MOD 1.32X4.8 BL (OTOLOGIC RELATED) IMPLANT

## 2018-09-30 NOTE — Transfer of Care (Signed)
Immediate Anesthesia Transfer of Care Note  Patient: Hailey Owen  Procedure(s) Performed: MYRINGOTOMY WITH RIGHT TUBE PLACEMENT (Right Ear)  Patient Location: PACU  Anesthesia Type:MAC  Level of Consciousness: sedated  Airway & Oxygen Therapy: Patient Spontanous Breathing and Patient connected to nasal cannula oxygen  Post-op Assessment: Report given to RN and Post -op Vital signs reviewed and stable  Post vital signs: Reviewed and stable  Last Vitals:  Vitals Value Taken Time  BP 147/78 09/30/2018  9:01 AM  Temp    Pulse 89 09/30/2018  9:03 AM  Resp 14 09/30/2018  9:03 AM  SpO2 92 % 09/30/2018  9:03 AM  Vitals shown include unvalidated device data.  Last Pain:  Vitals:   09/30/18 0743  TempSrc: Oral  PainSc: 0-No pain         Complications: No apparent anesthesia complications

## 2018-09-30 NOTE — Discharge Instructions (Signed)
°Post Anesthesia Home Care Instructions ° °Activity: °Get plenty of rest for the remainder of the day. A responsible individual must stay with you for 24 hours following the procedure.  °For the next 24 hours, DO NOT: °-Drive a car °-Operate machinery °-Drink alcoholic beverages °-Take any medication unless instructed by your physician °-Make any legal decisions or sign important papers. ° °Meals: °Start with liquid foods such as gelatin or soup. Progress to regular foods as tolerated. Avoid greasy, spicy, heavy foods. If nausea and/or vomiting occur, drink only clear liquids until the nausea and/or vomiting subsides. Call your physician if vomiting continues. ° °Special Instructions/Symptoms: °Your throat may feel dry or sore from the anesthesia or the breathing tube placed in your throat during surgery. If this causes discomfort, gargle with warm salt water. The discomfort should disappear within 24 hours. ° °If you had a scopolamine patch placed behind your ear for the management of post- operative nausea and/or vomiting: ° °1. The medication in the patch is effective for 72 hours, after which it should be removed.  Wrap patch in a tissue and discard in the trash. Wash hands thoroughly with soap and water. °2. You may remove the patch earlier than 72 hours if you experience unpleasant side effects which may include dry mouth, dizziness or visual disturbances. °3. Avoid touching the patch. Wash your hands with soap and water after contact with the patch. °  °POSTOPERATIVE INSTRUCTIONS FOR PATIENTS HAVING MYRINGOTOMY AND TUBES ° °1. Please use the ear drops in each ear with a new tube as instructed. Use the drops as prescribed by your doctor, placing the drops into the outer opening of the ear canal with the head tilted to the opposite side. Place a clean piece of cotton into the ear after using drops. A small amount of blood tinged drainage is not uncommon for several days after the tubes are inserted. °2. Nausea  and vomiting may be expected the first 6 hours after surgery. Offer liquids initially. If there is no nausea, small light meals are usually best tolerated the day of surgery. A normal diet may be resumed once nausea has passed. °3. The patient may experience mild ear discomfort the day of surgery, which is usually relieved by Tylenol. °4. A small amount of clear or blood-tinged drainage from the ears may occur a few days after surgery. If this should persists or become thick, green, yellow, or foul smelling, please contact our office at [phone]. °5. If you see clear, green, or yellow drainage from your child’s ear during colds, clean the outer ear gently with a soft, damp washcloth. Begin the prescribed ear drops (4 drops, twice a day) for one week, as previously instructed.  The drainage should stop within 48 hours after starting the ear drops. If the drainage continues or becomes yellow or green, please call our office. If your child develops a fever greater than 102 F, or has and persistent bleeding from the ear(s), please call us. °6. Try to avoid getting water in the ears. Swimming is permitted as long as there is no deep diving or swimming under water deeper than 3 feet. If you think water has gotten into the ear(s), either bathing or swimming, place 4 drops of the prescribed ear drops into the ear in question. We do recommend drops after swimming in the ocean, rivers, or lakes. °7. It is important for you to return for your scheduled appointment so that the status of the tubes can be determined.  ° °

## 2018-09-30 NOTE — Op Note (Signed)
DATE OF PROCEDURE:  09/30/2018                              OPERATIVE REPORT  SURGEON:  Leta Baptist, MD  PREOPERATIVE DIAGNOSES: 1. Bilateral eustachian tube dysfunction. 2. Recurrent middle ear effusion  POSTOPERATIVE DIAGNOSES: 1. Bilateral eustachian tube dysfunction. 2. Recurrent middle ear effusion  PROCEDURE PERFORMED: 1) Bilateral myringotomy and tube placement.          ANESTHESIA:  General facemask anesthesia.  COMPLICATIONS:  None.  ESTIMATED BLOOD LOSS:  Minimal.  INDICATION FOR PROCEDURE:   Hailey Owen is a 48 y.o. female with a history of bilateral Eustachian tube dysfunction and constant clogging sensation.  On examination, the patient was noted to have middle ear effusion on the right. She has not responded to medical treatments. Based on the above findings, the decision was made for the patient to undergo the myringotomy and tube placement procedure. Likelihood of success in reducing symptoms was also discussed.  The risks, benefits, alternatives, and details of the procedure were discussed with the mother.  Questions were invited and answered.  Informed consent was obtained.  DESCRIPTION:  The patient was taken to the operating room and placed supine on the operating table.  General facemask anesthesia was administered by the anesthesiologist.  Under the operating microscope, the right ear canal was cleaned of all cerumen.  The tympanic membrane was noted to be intact but mildly retracted.  A standard myringotomy incision was made at the anterior-inferior quadrant on the tympanic membrane.  A copious amount of mucoid fluid was suctioned from behind the tympanic membrane. A T tube was placed, followed by antibiotic eardrops in the ear canal.  The same procedure was repeated on the left side without exception. A scant amount of serous effusion was noted on the left. The care of the patient was turned over to the anesthesiologist.  The patient was awakened from anesthesia without  difficulty.  The patient was transferred to the recovery room in good condition.  OPERATIVE FINDINGS:  A copious amount of mucoid effusion was noted on the right. A scant amount of serous effusion was noted on the left.  SPECIMEN:  None.  FOLLOWUP CARE:  The patient will be placed on Otovel eardrops 1 vial each ear b.i.d..  The patient will follow up in my office in approximately 4 weeks.  Hermenegildo Clausen WOOI 09/30/2018

## 2018-09-30 NOTE — Anesthesia Preprocedure Evaluation (Addendum)
Anesthesia Evaluation  Patient identified by MRN, date of birth, ID band Patient awake    Reviewed: Allergy & Precautions, NPO status , Patient's Chart, lab work & pertinent test results  History of Anesthesia Complications Negative for: history of anesthetic complications  Airway Mallampati: IV  TM Distance: >3 FB Neck ROM: Full    Dental no notable dental hx.    Pulmonary sleep apnea , Current Smoker,    Pulmonary exam normal        Cardiovascular negative cardio ROS Normal cardiovascular exam     Neuro/Psych PSYCHIATRIC DISORDERS Anxiety Depression negative neurological ROS     GI/Hepatic Neg liver ROS, GERD  ,  Endo/Other  negative endocrine ROS  Renal/GU negative Renal ROS  negative genitourinary   Musculoskeletal negative musculoskeletal ROS (+)   Abdominal   Peds  Hematology negative hematology ROS (+)   Anesthesia Other Findings   Reproductive/Obstetrics                           Anesthesia Physical Anesthesia Plan  ASA: II  Anesthesia Plan: MAC   Post-op Pain Management:    Induction: Intravenous  PONV Risk Score and Plan: 1 and Treatment may vary due to age or medical condition, Ondansetron and Propofol infusion  Airway Management Planned: Mask and Natural Airway  Additional Equipment: None  Intra-op Plan:   Post-operative Plan:   Informed Consent: I have reviewed the patients History and Physical, chart, labs and discussed the procedure including the risks, benefits and alternatives for the proposed anesthesia with the patient or authorized representative who has indicated his/her understanding and acceptance.       Plan Discussed with:   Anesthesia Plan Comments:        Anesthesia Quick Evaluation

## 2018-09-30 NOTE — Anesthesia Postprocedure Evaluation (Signed)
Anesthesia Post Note  Patient: Hailey Owen  Procedure(s) Performed: MYRINGOTOMY WITH RIGHT TUBE PLACEMENT (Right Ear)     Patient location during evaluation: PACU Anesthesia Type: MAC Level of consciousness: awake and alert Pain management: pain level controlled Vital Signs Assessment: post-procedure vital signs reviewed and stable Respiratory status: spontaneous breathing, nonlabored ventilation and respiratory function stable Cardiovascular status: blood pressure returned to baseline and stable Postop Assessment: no apparent nausea or vomiting Anesthetic complications: no    Last Vitals:  Vitals:   09/30/18 0915 09/30/18 0930  BP: (!) 165/94 (!) 150/84  Pulse: 94 95  Resp: 16 16  Temp:  37.1 C  SpO2: 97% 96%    Last Pain:  Vitals:   09/30/18 0930  TempSrc:   PainSc: 0-No pain                 Lidia Collum

## 2018-09-30 NOTE — H&P (Addendum)
Cc: Chronic eustachian tube dysfunction   HPI: The patient previously underwent right myringotomy and tube placement for her chronic middle ear effusion and conductive hearing loss on 12/13/2017. The patient has recently noted clogging in both ears, worse on the right. No otalgia, otorrhea, or fever is noted. Her hearing is muffled. No other ENT, GI, or respiratory issue noted since the last visit.   Exam The patient is well nourished and well developed. Eyes: PERRL, EOMI. No scleral icterus, conjunctivae clear. Neuro: CN II exam reveals vision grossly intact. No nystagmus at any point of gaze. The right tube is extruded and encased in cerumen. Under the operating microscope, the tube and cerumen are carefully removed. After removal, the right TM is intact with middle ear effusion. The left TM is intact and mobile. Nasal and oral cavity exams are unremarkable. Palpation of the neck reveals no lymphadenopathy. Full range of cervical motion. The trachea is midline. Cranial nerves II through XII are all grossly intact.   AUDIOMETRIC TESTING:  I have read and reviewed the audiometric test, which shows mild bilateral high frequency sensorineural hearing loss with right conductive hearing loss. The speech reception threshold is 50dB AD and 15dB AS. The discrimination score is 88% AD and 100% AS. The tympanogram shows negative middle ear pressure on the right.  Assessment 1. The right ventilating tube is extruded and encased in cerumen. After removal, the TM is healed with middle ear effusion and associated hearing loss.  2. The left TM is intact. 3. Bilateral Eustachian tube dysfunction.  Plan  1. Otomicroscopy with right tube and cerumen removal. 2. Treatment options include conservative management with daily steroid nasal spray versus revision myringotomy and tube. The risks, benefits, alternatives, and details of the procedure are reviewed with the patient. Questions are invited and answered. 3. The  patient is interested in proceeding with the procedure but would like to have it done under anesthesia.  We will schedule the procedure in accordance with the family schedule.

## 2018-10-01 ENCOUNTER — Encounter (HOSPITAL_BASED_OUTPATIENT_CLINIC_OR_DEPARTMENT_OTHER): Payer: Self-pay | Admitting: Otolaryngology

## 2018-10-28 ENCOUNTER — Ambulatory Visit (INDEPENDENT_AMBULATORY_CARE_PROVIDER_SITE_OTHER): Payer: Medicare HMO | Admitting: Psychiatry

## 2018-10-28 ENCOUNTER — Encounter (HOSPITAL_COMMUNITY): Payer: Self-pay | Admitting: Psychiatry

## 2018-10-28 DIAGNOSIS — F331 Major depressive disorder, recurrent, moderate: Secondary | ICD-10-CM | POA: Diagnosis not present

## 2018-10-28 DIAGNOSIS — F411 Generalized anxiety disorder: Secondary | ICD-10-CM

## 2018-10-28 MED ORDER — ARIPIPRAZOLE 5 MG PO TABS: 5.0000 mg | ORAL_TABLET | Freq: Every day | ORAL | 0 refills | Status: DC

## 2018-10-28 MED ORDER — LAMOTRIGINE 150 MG PO TABS: 150.0000 mg | ORAL_TABLET | Freq: Two times a day (BID) | ORAL | 0 refills | Status: DC

## 2018-10-28 MED ORDER — LORAZEPAM 0.5 MG PO TABS: ORAL_TABLET | ORAL | 2 refills | Status: DC

## 2018-10-28 MED ORDER — VENLAFAXINE HCL ER 150 MG PO CP24: 150.0000 mg | ORAL_CAPSULE | Freq: Every day | ORAL | 0 refills | Status: DC
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
## 2018-11-10 ENCOUNTER — Ambulatory Visit (INDEPENDENT_AMBULATORY_CARE_PROVIDER_SITE_OTHER): Payer: Medicare HMO | Admitting: Otolaryngology

## 2018-11-10 ENCOUNTER — Other Ambulatory Visit: Payer: Self-pay

## 2018-11-10 DIAGNOSIS — H7203 Central perforation of tympanic membrane, bilateral: Secondary | ICD-10-CM | POA: Diagnosis not present

## 2018-11-10 DIAGNOSIS — H6983 Other specified disorders of Eustachian tube, bilateral: Secondary | ICD-10-CM | POA: Diagnosis not present

## 2018-12-09 DIAGNOSIS — Z6835 Body mass index (BMI) 35.0-35.9, adult: Secondary | ICD-10-CM | POA: Diagnosis not present

## 2018-12-09 DIAGNOSIS — F17210 Nicotine dependence, cigarettes, uncomplicated: Secondary | ICD-10-CM | POA: Diagnosis not present

## 2018-12-09 DIAGNOSIS — R569 Unspecified convulsions: Secondary | ICD-10-CM | POA: Diagnosis not present

## 2018-12-09 DIAGNOSIS — I10 Essential (primary) hypertension: Secondary | ICD-10-CM | POA: Diagnosis not present

## 2018-12-09 DIAGNOSIS — Z299 Encounter for prophylactic measures, unspecified: Secondary | ICD-10-CM | POA: Diagnosis not present

## 2018-12-23 ENCOUNTER — Other Ambulatory Visit: Payer: Self-pay

## 2018-12-23 ENCOUNTER — Ambulatory Visit (INDEPENDENT_AMBULATORY_CARE_PROVIDER_SITE_OTHER): Payer: Medicare HMO | Admitting: Psychiatry

## 2018-12-23 DIAGNOSIS — F411 Generalized anxiety disorder: Secondary | ICD-10-CM | POA: Diagnosis not present

## 2018-12-23 NOTE — Progress Notes (Signed)
Virtual Visit via Telephone Note  I connected with Hailey Owen on 12/23/18 at 1:20 PM EDT by telephone and verified that I am speaking with the correct person using two identifiers.   I discussed the limitations, risks, security and privacy concerns of performing an evaluation and management service by telephone and the availability of in person appointments. I also discussed with the patient that there may be a patient responsible charge related to this service. The patient expressed understanding and agreed to proceed.  I provided 26 minutes of non-face-to-face time during this encounter.   Alonza Smoker, LCSW       THERAPIST PROGRESS NOTE  Session Time: Tuesday 12/23/2018 1:20 PM -1:46 PM  Participation Level: Active  Behavioral Response: CasualAlertAnxious  Type of Therapy: Individual Therapy  Treatment Goals addressed: Learn and implement conflict resolution skills to resolve interpersonal problems.  Interventions: CBT and Supportive  Summary: Hailey Owen is a 48 y.o. female  who Is referred for services by psychiatrist Dr. Adele Schilder due to patient experiencing symptoms of anxiety and depression. She is a returning patient to this clinician as she was seen in 2007 - 2008 due to experiencing symptoms of depression.    Patient last was seen in May 2020. She reports coping well since last session. She broke up with boyfriend about 2 months ago as he was seeing someone else. She reports initially being angry and sad but deciding it was for the best. She states enjoying being single and being less anxious or worried. She enjoyed two recent trips to the beach with her cousin. She is pleased she drove to and from the beach with support from cousin during one trip. She reports improved daily routine and structure performing household tasks and doing errands. She reports positive interaction with mother and sister. She continues to report poor sleep patterns and attributes some of this to  caffeine use (Sundrop soft drinks). She states not being ready to reduce caffeine use.   Suicidal/Homicidal: Nowithout intent/plan  Therapist Response: reviewed symptoms, praised and reinforced patient's increased behavioral activation, discussed effects, discussed next steps for treatment including learning and implementing relapse prevention strategies, began to discuss lapse versus relapse, assigned patient to review handout on identifying early warning signs of depression in preparation for next session (therapist will mail handout) discussed possible stepdown plan to include 2-3 more sessions    Plan: Return again in 2 weeks.  Diagnosis: Axis I: MDD    Axis II: No diagnosis    Alonza Smoker, LCSW 12/23/2018

## 2019-01-08 ENCOUNTER — Other Ambulatory Visit: Payer: Self-pay

## 2019-01-08 ENCOUNTER — Ambulatory Visit (HOSPITAL_COMMUNITY): Payer: Medicare HMO | Admitting: Psychiatry

## 2019-01-19 ENCOUNTER — Ambulatory Visit (INDEPENDENT_AMBULATORY_CARE_PROVIDER_SITE_OTHER): Payer: Medicare HMO | Admitting: Otolaryngology

## 2019-01-19 DIAGNOSIS — R1312 Dysphagia, oropharyngeal phase: Secondary | ICD-10-CM

## 2019-01-19 DIAGNOSIS — H6983 Other specified disorders of Eustachian tube, bilateral: Secondary | ICD-10-CM

## 2019-01-19 DIAGNOSIS — H7203 Central perforation of tympanic membrane, bilateral: Secondary | ICD-10-CM

## 2019-01-21 ENCOUNTER — Other Ambulatory Visit (HOSPITAL_COMMUNITY): Payer: Self-pay | Admitting: Otolaryngology

## 2019-01-21 ENCOUNTER — Other Ambulatory Visit: Payer: Self-pay | Admitting: Otolaryngology

## 2019-01-21 DIAGNOSIS — R1312 Dysphagia, oropharyngeal phase: Secondary | ICD-10-CM

## 2019-01-22 ENCOUNTER — Other Ambulatory Visit: Payer: Self-pay

## 2019-01-22 ENCOUNTER — Ambulatory Visit (INDEPENDENT_AMBULATORY_CARE_PROVIDER_SITE_OTHER): Payer: Medicare HMO | Admitting: Psychiatry

## 2019-01-22 DIAGNOSIS — F411 Generalized anxiety disorder: Secondary | ICD-10-CM | POA: Diagnosis not present

## 2019-01-22 DIAGNOSIS — F331 Major depressive disorder, recurrent, moderate: Secondary | ICD-10-CM | POA: Diagnosis not present

## 2019-01-22 NOTE — Progress Notes (Signed)
Virtual Visit via Video Note  I connected with [name] on 01/22/19 at  3:00 PM EDT by a video enabled telemedicine application and verified that I am speaking with the correct person using two identifiers.   I discussed the limitations of evaluation and management by telemedicine and the availability of in person appointments. The patient expressed understanding and agreed to proceed.      I provided 35 minutes of non-face-to-face time during this encounter.   Alonza Smoker, LCSW        THERAPIST PROGRESS NOTE  Session Time: Thursday 01/22/2019 3:10 PM - 3:45 PM   Participation Level: Active  Behavioral Response: CasualAlertAnxious  Type of Therapy: Individual Therapy  Treatment Goals addressed: Learn and implement conflict resolution skills to resolve interpersonal problems.  Interventions: CBT and Supportive  Summary: Hailey Owen is a 48 y.o. female  who Is referred for services by psychiatrist Dr. Adele Schilder due to patient experiencing symptoms of anxiety and depression. She is a returning patient to this clinician as she was seen in 2007 - 2008 due to experiencing symptoms of depression.    Patient last was seen about 4 weeks ago. in May 2020. She reports doing very well since last session. She has been working out at gym  6 days per week for the past two weeks per her report. She says she feels better and having more energy since doing this. She denies being depressed and reports minimal symptoms of anxiety. She expresses appropriate concern regarding mother's health but denies dwelling on it or being overwhelmed by it. She reports she is grinding her teeth and states continued sleep difficulty. She reports erratic sleep schedule does not have consistent bed time or wake up time. She continues to drink about 48 ounces of Sundrop per day. She reports continued positive interaction with mother and sister.   Suicidal/Homicidal: Nowithout intent/plan  Therapist Response:  reviewed symptoms, praised and reinforced patient's improved self-care regarding exercise, discussed effects, discussed role of self-care and sleep in coping with depression/anxiety, developed plan with patient to gradually reduce caffeine use from 4 bottles of Sundrop per day to one per day to assist in improving sleep hygiene, developed plan to have consistent bed time and wake time, assisted patient identify and address thoughts and processes that may inhibit implementation of plan, will continue discuss relapse prevention next session,    Plan: Return again in 2 weeks.  Diagnosis: Axis I: MDD    Axis II: No diagnosis    Alonza Smoker, LCSW 01/22/2019

## 2019-01-28 ENCOUNTER — Ambulatory Visit (INDEPENDENT_AMBULATORY_CARE_PROVIDER_SITE_OTHER): Payer: Medicare HMO | Admitting: Psychiatry

## 2019-01-28 ENCOUNTER — Encounter (HOSPITAL_COMMUNITY): Payer: Self-pay | Admitting: Psychiatry

## 2019-01-28 ENCOUNTER — Other Ambulatory Visit: Payer: Self-pay

## 2019-01-28 DIAGNOSIS — F411 Generalized anxiety disorder: Secondary | ICD-10-CM

## 2019-01-28 DIAGNOSIS — F331 Major depressive disorder, recurrent, moderate: Secondary | ICD-10-CM | POA: Diagnosis not present

## 2019-01-28 MED ORDER — VENLAFAXINE HCL ER 150 MG PO CP24: 150.0000 mg | ORAL_CAPSULE | Freq: Every day | ORAL | 0 refills | Status: DC

## 2019-01-28 MED ORDER — LAMOTRIGINE 150 MG PO TABS: 150.0000 mg | ORAL_TABLET | Freq: Two times a day (BID) | ORAL | 0 refills | Status: DC

## 2019-01-28 MED ORDER — ARIPIPRAZOLE 5 MG PO TABS: 5.0000 mg | ORAL_TABLET | Freq: Every day | ORAL | 0 refills | Status: DC

## 2019-01-28 MED ORDER — LORAZEPAM 0.5 MG PO TABS: ORAL_TABLET | ORAL | 2 refills | Status: DC
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
## 2019-01-30 ENCOUNTER — Other Ambulatory Visit (HOSPITAL_COMMUNITY): Payer: Self-pay | Admitting: Otolaryngology

## 2019-01-30 ENCOUNTER — Other Ambulatory Visit: Payer: Self-pay

## 2019-01-30 ENCOUNTER — Ambulatory Visit (HOSPITAL_COMMUNITY)
Admission: RE | Admit: 2019-01-30 | Discharge: 2019-01-30 | Disposition: A | Discharge status: Home / Self Care | Payer: Medicare HMO | Source: Ambulatory Visit | Attending: Otolaryngology | Admitting: Otolaryngology

## 2019-01-30 DIAGNOSIS — R1312 Dysphagia, oropharyngeal phase: Secondary | ICD-10-CM | POA: Diagnosis not present

## 2019-01-30 DIAGNOSIS — R1310 Dysphagia, unspecified: Secondary | ICD-10-CM | POA: Diagnosis not present

## 2019-03-02 ENCOUNTER — Other Ambulatory Visit (HOSPITAL_COMMUNITY): Payer: Self-pay | Admitting: Psychiatry

## 2019-03-02 DIAGNOSIS — F331 Major depressive disorder, recurrent, moderate: Secondary | ICD-10-CM

## 2019-03-03 DIAGNOSIS — M7581 Other shoulder lesions, right shoulder: Secondary | ICD-10-CM | POA: Diagnosis not present

## 2019-03-03 DIAGNOSIS — M4722 Other spondylosis with radiculopathy, cervical region: Secondary | ICD-10-CM | POA: Diagnosis not present

## 2019-03-03 DIAGNOSIS — M25511 Pain in right shoulder: Secondary | ICD-10-CM | POA: Diagnosis not present

## 2019-03-06 ENCOUNTER — Other Ambulatory Visit (HOSPITAL_COMMUNITY): Payer: Self-pay | Admitting: Psychiatry

## 2019-03-06 DIAGNOSIS — F331 Major depressive disorder, recurrent, moderate: Secondary | ICD-10-CM

## 2019-03-11 ENCOUNTER — Other Ambulatory Visit (HOSPITAL_COMMUNITY): Payer: Self-pay | Admitting: Psychiatry

## 2019-03-11 DIAGNOSIS — Z299 Encounter for prophylactic measures, unspecified: Secondary | ICD-10-CM | POA: Diagnosis not present

## 2019-03-11 DIAGNOSIS — E669 Obesity, unspecified: Secondary | ICD-10-CM | POA: Diagnosis not present

## 2019-03-11 DIAGNOSIS — R569 Unspecified convulsions: Secondary | ICD-10-CM | POA: Diagnosis not present

## 2019-03-11 DIAGNOSIS — Z6834 Body mass index (BMI) 34.0-34.9, adult: Secondary | ICD-10-CM | POA: Diagnosis not present

## 2019-03-11 DIAGNOSIS — I10 Essential (primary) hypertension: Secondary | ICD-10-CM | POA: Diagnosis not present

## 2019-03-11 DIAGNOSIS — F17210 Nicotine dependence, cigarettes, uncomplicated: Secondary | ICD-10-CM | POA: Diagnosis not present

## 2019-03-11 DIAGNOSIS — F331 Major depressive disorder, recurrent, moderate: Secondary | ICD-10-CM

## 2019-03-12 ENCOUNTER — Other Ambulatory Visit (HOSPITAL_COMMUNITY): Payer: Self-pay | Admitting: Psychiatry

## 2019-03-12 DIAGNOSIS — F331 Major depressive disorder, recurrent, moderate: Secondary | ICD-10-CM

## 2019-03-23 DIAGNOSIS — M7501 Adhesive capsulitis of right shoulder: Secondary | ICD-10-CM | POA: Diagnosis not present

## 2019-03-23 DIAGNOSIS — M25511 Pain in right shoulder: Secondary | ICD-10-CM | POA: Diagnosis not present

## 2019-03-26 DIAGNOSIS — M7501 Adhesive capsulitis of right shoulder: Secondary | ICD-10-CM | POA: Diagnosis not present

## 2019-03-26 DIAGNOSIS — M25511 Pain in right shoulder: Secondary | ICD-10-CM | POA: Diagnosis not present

## 2019-03-31 DIAGNOSIS — M4722 Other spondylosis with radiculopathy, cervical region: Secondary | ICD-10-CM | POA: Diagnosis not present

## 2019-03-31 DIAGNOSIS — M7581 Other shoulder lesions, right shoulder: Secondary | ICD-10-CM | POA: Diagnosis not present

## 2019-03-31 DIAGNOSIS — M25511 Pain in right shoulder: Secondary | ICD-10-CM | POA: Diagnosis not present

## 2019-03-31 DIAGNOSIS — M7501 Adhesive capsulitis of right shoulder: Secondary | ICD-10-CM | POA: Diagnosis not present

## 2019-04-03 DIAGNOSIS — H524 Presbyopia: Secondary | ICD-10-CM | POA: Diagnosis not present

## 2019-04-07 DIAGNOSIS — M7501 Adhesive capsulitis of right shoulder: Secondary | ICD-10-CM | POA: Diagnosis not present

## 2019-04-07 DIAGNOSIS — M25511 Pain in right shoulder: Secondary | ICD-10-CM | POA: Diagnosis not present

## 2019-04-09 DIAGNOSIS — M25511 Pain in right shoulder: Secondary | ICD-10-CM | POA: Diagnosis not present

## 2019-04-09 DIAGNOSIS — M7501 Adhesive capsulitis of right shoulder: Secondary | ICD-10-CM | POA: Diagnosis not present

## 2019-04-15 DIAGNOSIS — M7501 Adhesive capsulitis of right shoulder: Secondary | ICD-10-CM | POA: Diagnosis not present

## 2019-04-15 DIAGNOSIS — M25511 Pain in right shoulder: Secondary | ICD-10-CM | POA: Diagnosis not present

## 2019-04-21 DIAGNOSIS — M25511 Pain in right shoulder: Secondary | ICD-10-CM | POA: Diagnosis not present

## 2019-04-21 DIAGNOSIS — M7501 Adhesive capsulitis of right shoulder: Secondary | ICD-10-CM | POA: Diagnosis not present

## 2019-04-23 DIAGNOSIS — M7501 Adhesive capsulitis of right shoulder: Secondary | ICD-10-CM | POA: Diagnosis not present

## 2019-04-23 DIAGNOSIS — M25511 Pain in right shoulder: Secondary | ICD-10-CM | POA: Diagnosis not present

## 2019-04-27 DIAGNOSIS — M25511 Pain in right shoulder: Secondary | ICD-10-CM | POA: Diagnosis not present

## 2019-04-27 DIAGNOSIS — M7501 Adhesive capsulitis of right shoulder: Secondary | ICD-10-CM | POA: Diagnosis not present

## 2019-04-29 ENCOUNTER — Encounter (HOSPITAL_COMMUNITY): Payer: Self-pay | Admitting: Psychiatry

## 2019-04-29 ENCOUNTER — Ambulatory Visit (INDEPENDENT_AMBULATORY_CARE_PROVIDER_SITE_OTHER): Payer: Medicare HMO | Admitting: Psychiatry

## 2019-04-29 ENCOUNTER — Other Ambulatory Visit: Payer: Self-pay

## 2019-04-29 DIAGNOSIS — F411 Generalized anxiety disorder: Secondary | ICD-10-CM

## 2019-04-29 DIAGNOSIS — F331 Major depressive disorder, recurrent, moderate: Secondary | ICD-10-CM | POA: Diagnosis not present

## 2019-04-29 MED ORDER — VENLAFAXINE HCL ER 75 MG PO CP24: 225.0000 mg | ORAL_CAPSULE | Freq: Every day | ORAL | 0 refills | Status: DC

## 2019-04-29 MED ORDER — ARIPIPRAZOLE 5 MG PO TABS: 5.0000 mg | ORAL_TABLET | Freq: Every day | ORAL | 0 refills | Status: DC

## 2019-04-29 MED ORDER — LAMOTRIGINE 150 MG PO TABS: 150.0000 mg | ORAL_TABLET | Freq: Two times a day (BID) | ORAL | 0 refills | Status: DC

## 2019-04-29 MED ORDER — LORAZEPAM 0.5 MG PO TABS: ORAL_TABLET | ORAL | 2 refills | Status: DC
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
## 2019-04-30 DIAGNOSIS — M25511 Pain in right shoulder: Secondary | ICD-10-CM | POA: Diagnosis not present

## 2019-04-30 DIAGNOSIS — M7501 Adhesive capsulitis of right shoulder: Secondary | ICD-10-CM | POA: Diagnosis not present

## 2019-05-01 ENCOUNTER — Other Ambulatory Visit: Payer: Self-pay

## 2019-05-01 ENCOUNTER — Encounter (HOSPITAL_COMMUNITY): Payer: Self-pay | Admitting: Psychiatry

## 2019-05-01 ENCOUNTER — Ambulatory Visit (INDEPENDENT_AMBULATORY_CARE_PROVIDER_SITE_OTHER): Payer: Medicare HMO | Admitting: Psychiatry

## 2019-05-01 DIAGNOSIS — F411 Generalized anxiety disorder: Secondary | ICD-10-CM

## 2019-05-01 DIAGNOSIS — F331 Major depressive disorder, recurrent, moderate: Secondary | ICD-10-CM

## 2019-05-01 NOTE — Progress Notes (Signed)
Virtual Visit via Telephone Note  I connected with Hailey Owen on 05/01/19 at 9:05 AM  EST  by telephone and verified that I am speaking with the correct person using two identifiers.   I discussed the limitations, risks, security and privacy concerns of performing an evaluation and management service by telephone and the availability of in person appointments. I also discussed with the patient that there may be a patient responsible charge related to this service. The patient expressed understanding and agreed to proceed.   I provided 35  minutes of non-face-to-face time during this encounter.         Adah Salvage, LCSW         THERAPIST PROGRESS NOTE  Session Time: Friday 05/01/2019 9:05 AM - 9:40 AM   Participation Level: Active  Behavioral Response: CasualAlertAnxious  Type of Therapy: Individual Therapy  Treatment Goals addressed: Learn and implement conflict resolution skills to resolve interpersonal problems.  Interventions: CBT and Supportive  Summary: Hailey Owen is a 49 y.o. female  who Is referred for services by psychiatrist Dr. Lolly Mustache due to patient experiencing symptoms of anxiety and depression. She is a returning patient to this clinician as she was seen in 2007 - 2008 due to experiencing symptoms of depression.    Patient last was seen about 3 months ago.  She reports resuming services due to experiencing increased symptoms of depression and anxiety during the holidays.  She identifies increased thoughts of her deceased uncle who died 2 years ago around Thanksgiving as possible trigger.  She reports experiencing poor motivation, excessive daytime sleeping but difficulty sleeping at night, isolated behaviors, decreased interest/involvement in activities, depressed mood, and anxiety.  She also reports having a couple of panic attacks but cannot identify any specific triggers.  She now reports decrease in some of the symptoms and has begun to increase physical activity.   She has resumed attending the gym and says this has been helpful.  However, she reports little involvement in other activities besides watching TV.  She still reports poor motivation regarding taking care of household responsibilities and has negative thoughts of being lazy.    Suicidal/Homicidal: Nowithout intent/plan  Therapist Response: reviewed symptoms, discussed stressors, facilitated expression of thoughts and feelings, validated feelings, provided psychoeducation regarding lapse versus relapse, praised and reinforced patient's efforts to increase physical activity, assisted patient develop plan to maintain behavioral activation, discussed ways to improve sleep hygiene including avoiding daytime napping, assisted patient identify and replace unhelpful thoughts evoking depression with more helpful thoughts, assisted patient began to identify early triggers and early warning signs of depression, therapist will send patient handout in preparation for next session,    Plan: Return again in 2 weeks.  Diagnosis: Axis I: MDD    Axis II: No diagnosis    Adah Salvage, LCSW 05/01/2019

## 2019-05-05 DIAGNOSIS — M4722 Other spondylosis with radiculopathy, cervical region: Secondary | ICD-10-CM | POA: Diagnosis not present

## 2019-05-05 DIAGNOSIS — M25511 Pain in right shoulder: Secondary | ICD-10-CM | POA: Diagnosis not present

## 2019-05-05 DIAGNOSIS — M7501 Adhesive capsulitis of right shoulder: Secondary | ICD-10-CM | POA: Diagnosis not present

## 2019-05-05 DIAGNOSIS — M7581 Other shoulder lesions, right shoulder: Secondary | ICD-10-CM | POA: Diagnosis not present

## 2019-05-07 DIAGNOSIS — M25511 Pain in right shoulder: Secondary | ICD-10-CM | POA: Diagnosis not present

## 2019-05-07 DIAGNOSIS — M7501 Adhesive capsulitis of right shoulder: Secondary | ICD-10-CM | POA: Diagnosis not present

## 2019-05-11 DIAGNOSIS — M7501 Adhesive capsulitis of right shoulder: Secondary | ICD-10-CM | POA: Diagnosis not present

## 2019-05-11 DIAGNOSIS — M25511 Pain in right shoulder: Secondary | ICD-10-CM | POA: Diagnosis not present

## 2019-05-18 DIAGNOSIS — H6981 Other specified disorders of Eustachian tube, right ear: Secondary | ICD-10-CM | POA: Diagnosis not present

## 2019-05-18 DIAGNOSIS — H9011 Conductive hearing loss, unilateral, right ear, with unrestricted hearing on the contralateral side: Secondary | ICD-10-CM | POA: Diagnosis not present

## 2019-05-18 DIAGNOSIS — H7201 Central perforation of tympanic membrane, right ear: Secondary | ICD-10-CM | POA: Diagnosis not present

## 2019-05-19 DIAGNOSIS — M7501 Adhesive capsulitis of right shoulder: Secondary | ICD-10-CM | POA: Diagnosis not present

## 2019-05-19 DIAGNOSIS — M25511 Pain in right shoulder: Secondary | ICD-10-CM | POA: Diagnosis not present

## 2019-05-21 ENCOUNTER — Ambulatory Visit (INDEPENDENT_AMBULATORY_CARE_PROVIDER_SITE_OTHER): Payer: Medicare HMO | Admitting: Psychiatry

## 2019-05-21 ENCOUNTER — Other Ambulatory Visit: Payer: Self-pay

## 2019-05-21 DIAGNOSIS — F411 Generalized anxiety disorder: Secondary | ICD-10-CM | POA: Diagnosis not present

## 2019-05-21 DIAGNOSIS — F331 Major depressive disorder, recurrent, moderate: Secondary | ICD-10-CM | POA: Diagnosis not present

## 2019-05-21 NOTE — Progress Notes (Signed)
Virtual Visit via Video Note  I connected with Hailey Owen on 05/21/19 at 2:10 PM ESTby a video enabled telemedicine application and verified that I am speaking with the correct person using two identifiers.   I discussed the limitations of evaluation and management by telemedicine and the availability of in person appointments. The patient expressed understanding and agreed to proceed.  I provided 40  minutes of non-face-to-face time during this encounter.   Hailey Salvage, LCSW       THERAPIST PROGRESS NOTE  Session Time: Thursday 05/21/2019 2:10 PM - 2:50 PM    Participation Level: Active  Behavioral Response: CasualAlertAnxious  Type of Therapy: Individual Therapy  Treatment Goals addressed: Learn and implement conflict resolution skills to resolve interpersonal problems.  Interventions: CBT and Supportive  Summary: [name] is a 49 y.o. female  who Is referred for services by psychiatrist Dr. Lolly Mustache due to patient experiencing symptoms of anxiety and depression. She is a returning patient to this clinician as she was seen in 2007 - 2008 due to experiencing symptoms of depression.    Patient last was seen about 3 weeks ago.  She reports feeling better since taking increased dosage of Effexor as prescribed by psychiatrist Dr. Lolly Mustache.  Patient reports increased energy, improved mood, increased motivation and decreased anxiety.  She denies having any panic attacks since last session but does report having panic-like symptoms.  However, she reports managing these by using deep breathing.  She has increased behavioral activation and reports going to the gym 3-4 times per week.  Normally she goes with a cousin or her sister but also will go alone if no one goes with her.  Patient also has been performing household tasks.  She  expresses concern that she is sleeping excessively.  She reports increased thoughts and memories of her deceased uncle for the past several days.  She becomes very  tearful and emotional as she discusses him in session.  She expresses anger with him as she reports his drug use contributed to his fatal heart attack.  Patient reports she has not really dealt with his death and has not accepted his death.    Suicidal/Homicidal: Nowithout intent/plan  Therapist Response: reviewed symptoms, praised and reinforced patient's increased behavioral activation, discussed effects, reviewed lapse versus relapse of depression, assisted patient identify early warning signs of depression and strategies to use to avoid relapse, facilitated patient expressing thoughts and feelings regarding grief and loss about uncle, assisted patient identify and verbalize feelings of anger and abandonment, normalized feelings related to grief, discussed the grief process including integrative grief, discussed connection between grief and depression, therapist will send patient handout on the stages of grief in preparation for next session   Plan: Return again in 2 weeks.  Diagnosis: Axis I: MDD    Axis II: No diagnosis    Hailey Salvage, LCSW 05/21/2019

## 2019-05-22 DIAGNOSIS — M25511 Pain in right shoulder: Secondary | ICD-10-CM | POA: Diagnosis not present

## 2019-05-22 DIAGNOSIS — M7501 Adhesive capsulitis of right shoulder: Secondary | ICD-10-CM | POA: Diagnosis not present

## 2019-06-02 DIAGNOSIS — M7581 Other shoulder lesions, right shoulder: Secondary | ICD-10-CM | POA: Diagnosis not present

## 2019-06-02 DIAGNOSIS — M25511 Pain in right shoulder: Secondary | ICD-10-CM | POA: Diagnosis not present

## 2019-06-02 DIAGNOSIS — M4722 Other spondylosis with radiculopathy, cervical region: Secondary | ICD-10-CM | POA: Diagnosis not present

## 2019-06-02 DIAGNOSIS — M7501 Adhesive capsulitis of right shoulder: Secondary | ICD-10-CM | POA: Diagnosis not present

## 2019-06-04 ENCOUNTER — Other Ambulatory Visit: Payer: Self-pay

## 2019-06-04 ENCOUNTER — Other Ambulatory Visit (HOSPITAL_COMMUNITY): Payer: Self-pay | Admitting: Psychiatry

## 2019-06-04 ENCOUNTER — Ambulatory Visit (INDEPENDENT_AMBULATORY_CARE_PROVIDER_SITE_OTHER): Payer: Medicare HMO | Admitting: Psychiatry

## 2019-06-04 DIAGNOSIS — F411 Generalized anxiety disorder: Secondary | ICD-10-CM

## 2019-06-04 DIAGNOSIS — F331 Major depressive disorder, recurrent, moderate: Secondary | ICD-10-CM | POA: Diagnosis not present

## 2019-06-04 NOTE — Progress Notes (Signed)
Virtual Visit via Video Note  I connected with [name] on 06/04/19 at 2:15 PM EST by a video enabled telemedicine application and verified that I am speaking with the correct person using two identifiers.   I discussed the limitations of evaluation and management by telemedicine and the availability of in person appointments. The patient expressed understanding and agreed to proceed.   I provided 40 minutes of non-face-to-face time during this encounter.   Hailey Salvage, LCSW      THERAPIST PROGRESS NOTE  Session Time: Thursday 06/04/2019 2:15 PM -  2:55 PM  Participation Level: Active  Behavioral Response: CasualAlertAnxious/tearaful at times   Type of Therapy: Individual Therapy  Treatment Goals addressed: Learn and implement conflict resolution skills to resolve interpersonal problems.  Interventions: CBT and Supportive  Summary: [name] is a 49 y.o. female  who Is referred for services by psychiatrist Dr. Lolly Mustache due to patient experiencing symptoms of anxiety and depression. She is a returning patient to this clinician as she was seen in 2007 - 2008 due to experiencing symptoms of depression.    Patient last was seen about 3 weeks ago.  She reports improved sleep hygiene and sleeping better since last session.  She is not sleeping excessively during the day and reports she is in bed by 11 or 11:30 at night.  She denies having any panic attacks and reports no major worry since last session.  She continues to go to the gym 3-4 times a week and is pleased with her 5 pound weight loss.  She continues to perform household tasks.  She reports continued sadness regarding her deceased uncle and reports having crying spells at times.  She states sometimes still expecting her uncle to come home although she knows he is not here.   Suicidal/Homicidal: Nowithout intent/plan  Therapist Response: reviewed symptoms, praised and reinforced patient's continued behavioral activation, began  to discuss the stages of grief, assisted patient identify her experience with the state of denial and anger, assisted patient identify underlying feelings beneath anger (abandonment, hurt, fear) assisted patient identify how she depended on her uncle and ways she has adapted to doing things without him, assigned patient to review stages of grief handout in preparation for next session    Plan: Return again in 2 weeks.  Diagnosis: Axis I: MDD    Axis II: No diagnosis    Hailey Salvage, LCSW 06/04/2019

## 2019-06-09 ENCOUNTER — Other Ambulatory Visit (HOSPITAL_COMMUNITY): Payer: Self-pay | Admitting: *Deleted

## 2019-06-09 DIAGNOSIS — Z299 Encounter for prophylactic measures, unspecified: Secondary | ICD-10-CM | POA: Diagnosis not present

## 2019-06-09 DIAGNOSIS — R109 Unspecified abdominal pain: Secondary | ICD-10-CM | POA: Diagnosis not present

## 2019-06-09 DIAGNOSIS — F331 Major depressive disorder, recurrent, moderate: Secondary | ICD-10-CM

## 2019-06-09 DIAGNOSIS — F322 Major depressive disorder, single episode, severe without psychotic features: Secondary | ICD-10-CM | POA: Diagnosis not present

## 2019-06-09 DIAGNOSIS — Z6834 Body mass index (BMI) 34.0-34.9, adult: Secondary | ICD-10-CM | POA: Diagnosis not present

## 2019-06-09 DIAGNOSIS — F411 Generalized anxiety disorder: Secondary | ICD-10-CM

## 2019-06-09 DIAGNOSIS — F17210 Nicotine dependence, cigarettes, uncomplicated: Secondary | ICD-10-CM | POA: Diagnosis not present

## 2019-06-09 DIAGNOSIS — G40909 Epilepsy, unspecified, not intractable, without status epilepticus: Secondary | ICD-10-CM | POA: Diagnosis not present

## 2019-06-09 DIAGNOSIS — K279 Peptic ulcer, site unspecified, unspecified as acute or chronic, without hemorrhage or perforation: Secondary | ICD-10-CM | POA: Diagnosis not present

## 2019-06-09 MED ORDER — VENLAFAXINE HCL ER 75 MG PO CP24: 225.0000 mg | ORAL_CAPSULE | Freq: Every day | ORAL | 0 refills | Status: DC

## 2019-06-21 DIAGNOSIS — E785 Hyperlipidemia, unspecified: Secondary | ICD-10-CM | POA: Diagnosis not present

## 2019-06-21 DIAGNOSIS — G43909 Migraine, unspecified, not intractable, without status migrainosus: Secondary | ICD-10-CM | POA: Diagnosis not present

## 2019-06-23 ENCOUNTER — Other Ambulatory Visit: Payer: Self-pay

## 2019-06-23 ENCOUNTER — Encounter (HOSPITAL_COMMUNITY): Payer: Self-pay | Admitting: Psychiatry

## 2019-06-23 ENCOUNTER — Ambulatory Visit (INDEPENDENT_AMBULATORY_CARE_PROVIDER_SITE_OTHER): Payer: Medicare HMO | Admitting: Psychiatry

## 2019-06-23 DIAGNOSIS — F331 Major depressive disorder, recurrent, moderate: Secondary | ICD-10-CM

## 2019-06-23 DIAGNOSIS — Z299 Encounter for prophylactic measures, unspecified: Secondary | ICD-10-CM | POA: Diagnosis not present

## 2019-06-23 DIAGNOSIS — F322 Major depressive disorder, single episode, severe without psychotic features: Secondary | ICD-10-CM | POA: Diagnosis not present

## 2019-06-23 DIAGNOSIS — F411 Generalized anxiety disorder: Secondary | ICD-10-CM

## 2019-06-23 DIAGNOSIS — Z6834 Body mass index (BMI) 34.0-34.9, adult: Secondary | ICD-10-CM | POA: Diagnosis not present

## 2019-06-23 DIAGNOSIS — I10 Essential (primary) hypertension: Secondary | ICD-10-CM | POA: Diagnosis not present

## 2019-06-23 DIAGNOSIS — R109 Unspecified abdominal pain: Secondary | ICD-10-CM | POA: Diagnosis not present

## 2019-06-23 DIAGNOSIS — G40909 Epilepsy, unspecified, not intractable, without status epilepticus: Secondary | ICD-10-CM | POA: Diagnosis not present
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
## 2019-07-07 DIAGNOSIS — Z0000 Encounter for general adult medical examination without abnormal findings: Secondary | ICD-10-CM | POA: Diagnosis not present

## 2019-07-07 DIAGNOSIS — E785 Hyperlipidemia, unspecified: Secondary | ICD-10-CM | POA: Diagnosis not present

## 2019-07-07 DIAGNOSIS — I10 Essential (primary) hypertension: Secondary | ICD-10-CM | POA: Diagnosis not present

## 2019-07-07 DIAGNOSIS — R350 Frequency of micturition: Secondary | ICD-10-CM | POA: Diagnosis not present

## 2019-07-07 DIAGNOSIS — Z79899 Other long term (current) drug therapy: Secondary | ICD-10-CM | POA: Diagnosis not present

## 2019-07-07 DIAGNOSIS — Z7189 Other specified counseling: Secondary | ICD-10-CM | POA: Diagnosis not present

## 2019-07-07 DIAGNOSIS — R5383 Other fatigue: Secondary | ICD-10-CM | POA: Diagnosis not present

## 2019-07-07 DIAGNOSIS — R569 Unspecified convulsions: Secondary | ICD-10-CM | POA: Diagnosis not present

## 2019-07-07 DIAGNOSIS — Z1331 Encounter for screening for depression: Secondary | ICD-10-CM | POA: Diagnosis not present

## 2019-07-07 DIAGNOSIS — Z1339 Encounter for screening examination for other mental health and behavioral disorders: Secondary | ICD-10-CM | POA: Diagnosis not present

## 2019-07-09 DIAGNOSIS — R109 Unspecified abdominal pain: Secondary | ICD-10-CM | POA: Diagnosis not present

## 2019-07-09 DIAGNOSIS — K760 Fatty (change of) liver, not elsewhere classified: Secondary | ICD-10-CM | POA: Diagnosis not present

## 2019-07-14 DIAGNOSIS — R739 Hyperglycemia, unspecified: Secondary | ICD-10-CM | POA: Diagnosis not present

## 2019-07-14 DIAGNOSIS — F322 Major depressive disorder, single episode, severe without psychotic features: Secondary | ICD-10-CM | POA: Diagnosis not present

## 2019-07-14 DIAGNOSIS — E785 Hyperlipidemia, unspecified: Secondary | ICD-10-CM | POA: Diagnosis not present

## 2019-07-14 DIAGNOSIS — Z299 Encounter for prophylactic measures, unspecified: Secondary | ICD-10-CM | POA: Diagnosis not present

## 2019-07-14 DIAGNOSIS — Z6834 Body mass index (BMI) 34.0-34.9, adult: Secondary | ICD-10-CM | POA: Diagnosis not present

## 2019-07-14 DIAGNOSIS — I10 Essential (primary) hypertension: Secondary | ICD-10-CM | POA: Diagnosis not present

## 2019-07-15 DIAGNOSIS — F331 Major depressive disorder, recurrent, moderate: Secondary | ICD-10-CM

## 2019-07-22 DIAGNOSIS — E785 Hyperlipidemia, unspecified: Secondary | ICD-10-CM | POA: Diagnosis not present

## 2019-07-22 DIAGNOSIS — I10 Essential (primary) hypertension: Secondary | ICD-10-CM | POA: Diagnosis not present

## 2019-07-28 ENCOUNTER — Other Ambulatory Visit: Payer: Self-pay

## 2019-07-28 ENCOUNTER — Ambulatory Visit (INDEPENDENT_AMBULATORY_CARE_PROVIDER_SITE_OTHER): Payer: Medicare HMO | Admitting: Psychiatry

## 2019-07-28 ENCOUNTER — Encounter (HOSPITAL_COMMUNITY): Payer: Self-pay | Admitting: Psychiatry

## 2019-07-28 DIAGNOSIS — F331 Major depressive disorder, recurrent, moderate: Secondary | ICD-10-CM

## 2019-07-28 DIAGNOSIS — F411 Generalized anxiety disorder: Secondary | ICD-10-CM

## 2019-07-28 MED ORDER — LAMOTRIGINE 150 MG PO TABS: 150.0000 mg | ORAL_TABLET | Freq: Two times a day (BID) | ORAL | 0 refills | Status: DC

## 2019-07-28 MED ORDER — VENLAFAXINE HCL ER 75 MG PO CP24: 225.0000 mg | ORAL_CAPSULE | Freq: Every day | ORAL | 0 refills | Status: DC

## 2019-07-28 MED ORDER — ARIPIPRAZOLE 5 MG PO TABS: 5.0000 mg | ORAL_TABLET | Freq: Every day | ORAL | 0 refills | Status: DC

## 2019-07-28 MED ORDER — LORAZEPAM 0.5 MG PO TABS: ORAL_TABLET | ORAL | 2 refills | Status: DC
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
## 2019-07-29 ENCOUNTER — Other Ambulatory Visit (HOSPITAL_COMMUNITY): Payer: Self-pay | Admitting: Psychiatry

## 2019-07-29 DIAGNOSIS — F331 Major depressive disorder, recurrent, moderate: Secondary | ICD-10-CM

## 2019-07-29 DIAGNOSIS — F411 Generalized anxiety disorder: Secondary | ICD-10-CM

## 2019-08-13 ENCOUNTER — Telehealth (HOSPITAL_COMMUNITY): Payer: Self-pay | Admitting: Psychiatry

## 2019-08-13 ENCOUNTER — Other Ambulatory Visit: Payer: Self-pay

## 2019-08-13 ENCOUNTER — Telehealth (HOSPITAL_COMMUNITY): Payer: Medicare HMO | Admitting: Psychiatry

## 2019-08-13 DIAGNOSIS — J329 Chronic sinusitis, unspecified: Secondary | ICD-10-CM | POA: Diagnosis not present

## 2019-08-13 DIAGNOSIS — I10 Essential (primary) hypertension: Secondary | ICD-10-CM | POA: Diagnosis not present

## 2019-08-13 DIAGNOSIS — F17210 Nicotine dependence, cigarettes, uncomplicated: Secondary | ICD-10-CM | POA: Diagnosis not present

## 2019-08-13 DIAGNOSIS — Z299 Encounter for prophylactic measures, unspecified: Secondary | ICD-10-CM | POA: Diagnosis not present

## 2019-08-13 NOTE — Telephone Encounter (Signed)
Therapist called patient for scheduled appointment.  However, patient reports not feeling well and says she just arrived home from the doctor.  She reports having bronchitis and a sinus infection.  Therapist and patient agreed to cancel appointment.

## 2019-08-24 ENCOUNTER — Other Ambulatory Visit (HOSPITAL_COMMUNITY): Payer: Self-pay | Admitting: Psychiatry

## 2019-08-24 DIAGNOSIS — F331 Major depressive disorder, recurrent, moderate: Secondary | ICD-10-CM

## 2019-08-24 DIAGNOSIS — F411 Generalized anxiety disorder: Secondary | ICD-10-CM

## 2019-08-27 ENCOUNTER — Ambulatory Visit (HOSPITAL_COMMUNITY): Payer: Medicare HMO | Admitting: Psychiatry

## 2019-08-27 ENCOUNTER — Other Ambulatory Visit: Payer: Self-pay

## 2019-09-01 ENCOUNTER — Other Ambulatory Visit (HOSPITAL_COMMUNITY): Payer: Self-pay | Admitting: Psychiatry

## 2019-09-01 DIAGNOSIS — F331 Major depressive disorder, recurrent, moderate: Secondary | ICD-10-CM

## 2019-09-01 DIAGNOSIS — F411 Generalized anxiety disorder: Secondary | ICD-10-CM

## 2019-09-02 ENCOUNTER — Other Ambulatory Visit (HOSPITAL_COMMUNITY): Payer: Self-pay | Admitting: Psychiatry

## 2019-09-02 DIAGNOSIS — F331 Major depressive disorder, recurrent, moderate: Secondary | ICD-10-CM

## 2019-09-02 DIAGNOSIS — F411 Generalized anxiety disorder: Secondary | ICD-10-CM

## 2019-09-03 ENCOUNTER — Other Ambulatory Visit (HOSPITAL_COMMUNITY): Payer: Self-pay | Admitting: *Deleted

## 2019-09-03 DIAGNOSIS — F331 Major depressive disorder, recurrent, moderate: Secondary | ICD-10-CM

## 2019-09-03 DIAGNOSIS — F411 Generalized anxiety disorder: Secondary | ICD-10-CM

## 2019-09-03 MED ORDER — VENLAFAXINE HCL ER 75 MG PO CP24: 225.0000 mg | ORAL_CAPSULE | Freq: Every day | ORAL | 0 refills | Status: DC

## 2019-09-10 ENCOUNTER — Ambulatory Visit (INDEPENDENT_AMBULATORY_CARE_PROVIDER_SITE_OTHER): Payer: Medicare Other | Admitting: Psychiatry

## 2019-09-10 ENCOUNTER — Other Ambulatory Visit: Payer: Self-pay

## 2019-09-10 DIAGNOSIS — F411 Generalized anxiety disorder: Secondary | ICD-10-CM

## 2019-09-10 NOTE — Progress Notes (Signed)
Virtual Visit via Video Note  I connected with Hailey Owen on 09/10/19 at  3:00 PM EDT by a video enabled telemedicine application and verified that I am speaking with the correct person using two identifiers.   I discussed the limitations of evaluation and management by telemedicine and the availability of in person appointments. The patient expressed understanding and agreed to proceed.  I provided 33 minutes of non-face-to-face time during this encounter.   Adah Salvage, LCSW      THERAPIST PROGRESS NOTE  Session Time: Thursday 09/10/2019 3:10 PM - 3:43 PM   Participation Level: Active  Behavioral Response: CasualAlert/euthymic  Type of Therapy: Individual Therapy  Treatment Goals addressed: Learn and implement conflict resolution skills to resolve interpersonal problems.  Interventions: CBT and Supportive  Summary: Hailey Owen is a 49 y.o. female  who Is referred for services by psychiatrist Dr. Lolly Mustache due to patient experiencing symptoms of anxiety and depression. She is a returning patient to this clinician as she was seen in 2007 - 2008 due to experiencing symptoms of depression.    Patient last was seen about 2 months ago.  She reports minimal depression and anxiety since last session.  She expresses acceptance of her uncle's death and states now being more at peace.  She reports becoming tearful occasionally when thinking about uncle.  She has increased behavioral activation and reports attending the gym regularly and socializing with family and friends.  She reports positive relationship with mother and expresses relief mother has retired.  She is excited she has made a new friend and reports playing cards with friend.  Patient reports improved self-care regarding eating and sleep hygiene.  Suicidal/Homicidal: Nowithout intent/plan  Therapist Response: reviewed symptoms, praised and reinforced patient's continued behavioral activation and improved self-care, reviewed  progress in treatment, reviewed lapse versus relapse, assisted patient identify early warning signs of depression, assisted patient identify strategies to intervene to avoid relapse, developed plan with patient to maintain consistency regarding daily schedule and sleep hygiene, discussed next steps for treatment to include relapse prevention skills, will send patient handout on mindfulness and the window of tolerance in preparation for next session    Plan: Return again in 2 weeks.  Diagnosis: Axis I: MDD    Axis II: No diagnosis    Adah Salvage, LCSW 09/10/2019

## 2019-10-04 ENCOUNTER — Other Ambulatory Visit (HOSPITAL_COMMUNITY): Payer: Self-pay | Admitting: Psychiatry

## 2019-10-04 DIAGNOSIS — F331 Major depressive disorder, recurrent, moderate: Secondary | ICD-10-CM

## 2019-10-04 DIAGNOSIS — F411 Generalized anxiety disorder: Secondary | ICD-10-CM

## 2019-10-05 ENCOUNTER — Other Ambulatory Visit: Payer: Self-pay

## 2019-10-05 ENCOUNTER — Ambulatory Visit (INDEPENDENT_AMBULATORY_CARE_PROVIDER_SITE_OTHER): Payer: Medicare Other | Admitting: Psychiatry

## 2019-10-05 DIAGNOSIS — F411 Generalized anxiety disorder: Secondary | ICD-10-CM | POA: Diagnosis not present

## 2019-10-05 NOTE — Progress Notes (Signed)
Virtual Visit via Video Note  I connected with [name] on 10/05/19 at 4:08 PM EDT  by a video enabled telemedicine application and verified that I am speaking with the correct person using two identifiers.   I discussed the limitations of evaluation and management by telemedicine and the availability of in person appointments. The patient expressed understanding and agreed to proceed.   I provided 32 minutes of non-face-to-face time during this encounter.   Adah Salvage, LCSW   THERAPIST PROGRESS NOTE   Location: Patient-Home/ Provider  [facility] [place] office  Session Time: Monday 10/05/2019 4:06 PM - 4:38 PM   Participation Level: Active  Behavioral Response: CasualAlert/euthymic  Type of Therapy: Individual Therapy  Treatment Goals addressed: Learn and implement conflict resolution skills to resolve interpersonal problems.  Interventions: CBT and Supportive  Summary: Hailey Owen is a 49 y.o. female  who Is referred for services by psychiatrist Dr. Lolly Mustache due to patient experiencing symptoms of anxiety and depression. She is a returning patient to this clinician as she was seen in 2007 - 2008 due to experiencing symptoms of depression.    Patient last was seen about 3 weeks ago.  She reports minimal depression and anxiety since last session.  She continues to attend gym regularly and socializes with family and friends.  She reports she went swimming today.  She reports positive relationship with mother and sister.  She continues to improve self-care regarding eating and sleep hygiene.  Patient reports misplacing information mailed for today's session.   Suicidal/Homicidal: Nowithout intent/plan  Therapist Response: reviewed symptoms, praised and reinforced patient's continued behavioral activation and improved self-care, reviewed lapse versus relapse, introduced mindfulness and rationale for using mindfulness as part of relapse prevention, assisted patient practice a  mindfulness activity using breath awareness, develop plan with patient to practice daily,  will send patient handout on mindfulness and the window of tolerance in preparation for next session, encouraged patient to maintain consistent behavioral activation and positive self-care   Plan: Return again in 2 weeks.  Diagnosis: Axis I: MDD    Axis II: No diagnosis    Adah Salvage, LCSW 10/05/2019

## 2019-10-06 ENCOUNTER — Other Ambulatory Visit (HOSPITAL_COMMUNITY): Payer: Self-pay | Admitting: *Deleted

## 2019-10-06 DIAGNOSIS — L0291 Cutaneous abscess, unspecified: Secondary | ICD-10-CM | POA: Diagnosis not present

## 2019-10-06 DIAGNOSIS — F411 Generalized anxiety disorder: Secondary | ICD-10-CM

## 2019-10-06 DIAGNOSIS — F331 Major depressive disorder, recurrent, moderate: Secondary | ICD-10-CM

## 2019-10-06 DIAGNOSIS — Z299 Encounter for prophylactic measures, unspecified: Secondary | ICD-10-CM | POA: Diagnosis not present

## 2019-10-06 DIAGNOSIS — H9201 Otalgia, right ear: Secondary | ICD-10-CM | POA: Diagnosis not present

## 2019-10-06 DIAGNOSIS — I10 Essential (primary) hypertension: Secondary | ICD-10-CM | POA: Diagnosis not present

## 2019-10-06 MED ORDER — VENLAFAXINE HCL ER 75 MG PO CP24: 225.0000 mg | ORAL_CAPSULE | Freq: Every day | ORAL | 0 refills | Status: DC

## 2019-10-07 ENCOUNTER — Encounter (HOSPITAL_COMMUNITY): Payer: Self-pay | Admitting: *Deleted

## 2019-10-07 ENCOUNTER — Telehealth (HOSPITAL_COMMUNITY): Payer: Self-pay | Admitting: *Deleted

## 2019-10-07 NOTE — Telephone Encounter (Signed)
LMOM for patient to call office to sch 2 appts 3 wks apart per [name]. Message will be sent to patient via her mychart.

## 2019-10-20 DIAGNOSIS — Z1231 Encounter for screening mammogram for malignant neoplasm of breast: Secondary | ICD-10-CM | POA: Diagnosis not present

## 2019-10-21 DIAGNOSIS — E785 Hyperlipidemia, unspecified: Secondary | ICD-10-CM | POA: Diagnosis not present

## 2019-10-21 DIAGNOSIS — I10 Essential (primary) hypertension: Secondary | ICD-10-CM | POA: Diagnosis not present

## 2019-10-27 ENCOUNTER — Other Ambulatory Visit: Payer: Self-pay

## 2019-10-27 ENCOUNTER — Telehealth (INDEPENDENT_AMBULATORY_CARE_PROVIDER_SITE_OTHER): Payer: Medicare Other | Admitting: Psychiatry

## 2019-10-27 ENCOUNTER — Encounter (HOSPITAL_COMMUNITY): Payer: Self-pay | Admitting: Psychiatry

## 2019-10-27 DIAGNOSIS — F411 Generalized anxiety disorder: Secondary | ICD-10-CM | POA: Diagnosis not present

## 2019-10-27 DIAGNOSIS — Z299 Encounter for prophylactic measures, unspecified: Secondary | ICD-10-CM | POA: Diagnosis not present

## 2019-10-27 DIAGNOSIS — I10 Essential (primary) hypertension: Secondary | ICD-10-CM | POA: Diagnosis not present

## 2019-10-27 DIAGNOSIS — F331 Major depressive disorder, recurrent, moderate: Secondary | ICD-10-CM | POA: Diagnosis not present

## 2019-10-27 DIAGNOSIS — E785 Hyperlipidemia, unspecified: Secondary | ICD-10-CM | POA: Diagnosis not present

## 2019-10-27 DIAGNOSIS — K5900 Constipation, unspecified: Secondary | ICD-10-CM | POA: Diagnosis not present

## 2019-10-27 MED ORDER — VENLAFAXINE HCL ER 75 MG PO CP24: 225.0000 mg | ORAL_CAPSULE | Freq: Every day | ORAL | 2 refills | Status: DC

## 2019-10-27 MED ORDER — ARIPIPRAZOLE 5 MG PO TABS: 5.0000 mg | ORAL_TABLET | Freq: Every day | ORAL | 0 refills | Status: DC

## 2019-10-27 MED ORDER — LAMOTRIGINE 150 MG PO TABS: 150.0000 mg | ORAL_TABLET | Freq: Two times a day (BID) | ORAL | 0 refills | Status: DC

## 2019-10-27 MED ORDER — LORAZEPAM 0.5 MG PO TABS: ORAL_TABLET | ORAL | 2 refills | Status: DC
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
## 2019-11-02 DIAGNOSIS — H66011 Acute suppurative otitis media with spontaneous rupture of ear drum, right ear: Secondary | ICD-10-CM | POA: Diagnosis not present

## 2019-11-16 ENCOUNTER — Ambulatory Visit (INDEPENDENT_AMBULATORY_CARE_PROVIDER_SITE_OTHER): Payer: Medicare Other | Admitting: Psychiatry

## 2019-11-16 ENCOUNTER — Other Ambulatory Visit: Payer: Self-pay

## 2019-11-16 DIAGNOSIS — F331 Major depressive disorder, recurrent, moderate: Secondary | ICD-10-CM | POA: Diagnosis not present

## 2019-11-16 DIAGNOSIS — F411 Generalized anxiety disorder: Secondary | ICD-10-CM | POA: Diagnosis not present

## 2019-11-16 NOTE — Progress Notes (Signed)
Virtual Visit via Video Note  I connected with Hailey Owen on 11/16/19 at 2:10 PM EDT  by a video enabled telemedicine application and verified that I am speaking with the correct person using two identifiers.   I discussed the limitations of evaluation and management by telemedicine and the availability of in person appointments. The patient expressed understanding and agreed to proceed.  I provided 45 minutes of non-face-to-face time during this encounter.   Adah Salvage, LCSW    THERAPIST PROGRESS NOTE   Location: Patient-Home/ Provider  [facility] [place] office  Session Time: Monday 11/16/2019 2:10 PM - 2:55 PM   Participation Level: Active  Behavioral Response: CasualAlert/anxiet  Type of Therapy: Individual Therapy  Treatment Goals addressed: Learn and implement conflict resolution skills to resolve interpersonal problems/learn and implement relapse prevention strategies  Interventions: CBT and Supportive  Summary: Hailey Owen is a 49 y.o. female  who Is referred for services by psychiatrist Dr. Lolly Mustache due to patient experiencing symptoms of anxiety and depression. She is a returning patient to this clinician as she was seen in 2007 - 2008 due to experiencing symptoms of depression.    Patient last was seen about 6 weeks  ago.  She reports minimal depression and anxiety since last session.  However, she reports increased stress as her mother recently fell and broke her hip.  Patient now is providing care for her mother at their home.  She expresses some anxiety about this but reports coping fairly well.  She has been using deep breathing per her report and trying to have time alone in her room.  She reports trying to take breaks when mother has visitors.  Patient reports minimal help from sister with her mother but says she has a cousin who tries to help.  She  reports mother is in the process of getting a home health aide which hopefully will provide more support for  patient's mother as well as a break for patient.  She reports currently not being able to go to the gym but planning to resume  as mother progresses.  Suicidal/Homicidal: Nowithout intent/plan  Therapist Response: reviewed symptoms, discussed stressors, facilitated expression of thoughts and feelings, validated feelings, assisted patient identify ways to try to maintain positive self-care, discussed ways to avoid a relapse, provided psychoeducation on mindfulness and the window of tolerance, assisted patient identify and practice grounding techniques when outside of the window of tolerance, developed plan with patient to practice a grounding technique daily between sessions.  Plan: Return again in 4 weeks.  Diagnosis: Axis I: MDD    Axis II: No diagnosis    Adah Salvage, LCSW 11/16/2019

## 2019-11-30 ENCOUNTER — Ambulatory Visit (INDEPENDENT_AMBULATORY_CARE_PROVIDER_SITE_OTHER): Payer: Medicare Other | Admitting: Psychiatry

## 2019-11-30 ENCOUNTER — Other Ambulatory Visit: Payer: Self-pay

## 2019-11-30 DIAGNOSIS — F411 Generalized anxiety disorder: Secondary | ICD-10-CM

## 2019-11-30 NOTE — Progress Notes (Signed)
Virtual Visit via Video Note  I connected with Hailey Owen on 11/30/19 at  4:00 PM EDT by a video enabled telemedicine application and verified that I am speaking with the correct person using two identifiers.   I discussed the limitations of evaluation and management by telemedicine and the availability of in person appointments. The patient expressed understanding and agreed to proceed.    I provided 42 minutes of non-face-to-face time during this encounter.   Adah Salvage, LCSW     THERAPIST PROGRESS NOTE   Location: Patient-Home/ Provider  [facility] [place] office  Session Time: Monday 11/30/2019 4:00 PM - 4:42 PM   Participation Level: Active  Behavioral Response: CasualAlert/anxiet  Type of Therapy: Individual Therapy  Treatment Goals addressed: Learn and implement conflict resolution skills to resolve interpersonal problems/learn and implement relapse prevention strategies  Interventions: CBT and Supportive  Summary: Hailey Owen is a 49 y.o. female  who Is referred for services by psychiatrist Dr. Lolly Mustache due to patient experiencing symptoms of anxiety and depression. She is a returning patient to this clinician as she was seen in 2007 - 2008 due to experiencing symptoms of depression.    Patient last was seen about 2-3  weeks  ago.  She reports minimal depression and anxiety since last session.  She reports decreased stress as mother is now using a walker and has become more independent.  Patient reports consistent help and support from a friend.  She now is able to do errands and has more time for self-care.  Patient reports becoming more aware of the window of tolerance and reports using grounding techniques.  Patient still plans to resume attending the gym as her mother progresses.  He has maintained involvement and activities at home such as daily household tasks.  Patient is pleased with her progress and her use of coping skills.    Suicidal/Homicidal:  Nowithout intent/plan  Therapist Response: reviewed symptoms, praised and reinforced patient's continued behavioral activation/use of support system/efforts to practice grounding techniques, continue to discuss mindfulness and the window of tolerance, provide psychoeducation on ways to improve mindfulness skills, assisted patient identify and practice mindfulness activities (eating, breath awareness), developed plan with patient to practice a mindfulness activity daily, processed patient's feelings about termination, will send patient maintenance plan in preparation for next session    Plan: Return again in 4 weeks.  Diagnosis: Axis I: MDD    Axis II: No diagnosis    Adah Salvage, LCSW 11/30/2019

## 2020-01-04 ENCOUNTER — Other Ambulatory Visit (HOSPITAL_COMMUNITY): Payer: Self-pay | Admitting: Psychiatry

## 2020-01-04 DIAGNOSIS — F331 Major depressive disorder, recurrent, moderate: Secondary | ICD-10-CM

## 2020-01-18 ENCOUNTER — Other Ambulatory Visit (HOSPITAL_COMMUNITY): Payer: Self-pay | Admitting: Psychiatry

## 2020-01-18 DIAGNOSIS — F411 Generalized anxiety disorder: Secondary | ICD-10-CM

## 2020-01-21 DIAGNOSIS — I10 Essential (primary) hypertension: Secondary | ICD-10-CM | POA: Diagnosis not present

## 2020-01-21 DIAGNOSIS — E785 Hyperlipidemia, unspecified: Secondary | ICD-10-CM | POA: Diagnosis not present

## 2020-01-27 ENCOUNTER — Encounter (HOSPITAL_COMMUNITY): Payer: Self-pay | Admitting: Psychiatry

## 2020-01-27 ENCOUNTER — Telehealth (HOSPITAL_COMMUNITY): Payer: Self-pay

## 2020-01-27 ENCOUNTER — Telehealth (INDEPENDENT_AMBULATORY_CARE_PROVIDER_SITE_OTHER): Payer: Medicare Other | Admitting: Psychiatry

## 2020-01-27 ENCOUNTER — Other Ambulatory Visit: Payer: Self-pay

## 2020-01-27 DIAGNOSIS — F411 Generalized anxiety disorder: Secondary | ICD-10-CM | POA: Diagnosis not present

## 2020-01-27 DIAGNOSIS — F331 Major depressive disorder, recurrent, moderate: Secondary | ICD-10-CM

## 2020-01-27 MED ORDER — LAMOTRIGINE 150 MG PO TABS: 150.0000 mg | ORAL_TABLET | Freq: Two times a day (BID) | ORAL | 0 refills | Status: DC

## 2020-01-27 MED ORDER — VENLAFAXINE HCL ER 75 MG PO CP24: 225.0000 mg | ORAL_CAPSULE | Freq: Every day | ORAL | 2 refills | Status: DC

## 2020-01-27 MED ORDER — LORAZEPAM 0.5 MG PO TABS: ORAL_TABLET | ORAL | 2 refills | Status: DC

## 2020-01-27 MED ORDER — ARIPIPRAZOLE 5 MG PO TABS: 5.0000 mg | ORAL_TABLET | Freq: Every day | ORAL | 0 refills | Status: DC

## 2020-01-27 NOTE — Telephone Encounter (Signed)
error 
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
## 2020-01-31 ENCOUNTER — Other Ambulatory Visit (HOSPITAL_COMMUNITY): Payer: Self-pay | Admitting: Psychiatry

## 2020-01-31 DIAGNOSIS — F411 Generalized anxiety disorder: Secondary | ICD-10-CM

## 2020-01-31 DIAGNOSIS — F331 Major depressive disorder, recurrent, moderate: Secondary | ICD-10-CM

## 2020-02-07 ENCOUNTER — Other Ambulatory Visit (HOSPITAL_COMMUNITY): Payer: Self-pay | Admitting: Psychiatry

## 2020-02-07 DIAGNOSIS — F331 Major depressive disorder, recurrent, moderate: Secondary | ICD-10-CM

## 2020-02-09 ENCOUNTER — Other Ambulatory Visit (HOSPITAL_COMMUNITY): Payer: Self-pay | Admitting: *Deleted

## 2020-02-09 DIAGNOSIS — F331 Major depressive disorder, recurrent, moderate: Secondary | ICD-10-CM

## 2020-02-09 MED ORDER — LAMOTRIGINE 150 MG PO TABS: 150.0000 mg | ORAL_TABLET | Freq: Two times a day (BID) | ORAL | 0 refills | Status: DC

## 2020-02-10 ENCOUNTER — Other Ambulatory Visit: Payer: Self-pay

## 2020-02-10 ENCOUNTER — Ambulatory Visit (INDEPENDENT_AMBULATORY_CARE_PROVIDER_SITE_OTHER): Payer: Medicare Other | Admitting: Psychiatry

## 2020-02-10 DIAGNOSIS — E785 Hyperlipidemia, unspecified: Secondary | ICD-10-CM | POA: Diagnosis not present

## 2020-02-10 DIAGNOSIS — F331 Major depressive disorder, recurrent, moderate: Secondary | ICD-10-CM | POA: Diagnosis not present

## 2020-02-10 DIAGNOSIS — F411 Generalized anxiety disorder: Secondary | ICD-10-CM

## 2020-02-10 DIAGNOSIS — Z2821 Immunization not carried out because of patient refusal: Secondary | ICD-10-CM | POA: Diagnosis not present

## 2020-02-10 DIAGNOSIS — I10 Essential (primary) hypertension: Secondary | ICD-10-CM | POA: Diagnosis not present

## 2020-02-10 DIAGNOSIS — Z299 Encounter for prophylactic measures, unspecified: Secondary | ICD-10-CM | POA: Diagnosis not present

## 2020-02-10 DIAGNOSIS — L0291 Cutaneous abscess, unspecified: Secondary | ICD-10-CM | POA: Diagnosis not present

## 2020-02-10 DIAGNOSIS — F17210 Nicotine dependence, cigarettes, uncomplicated: Secondary | ICD-10-CM | POA: Diagnosis not present

## 2020-02-10 NOTE — Progress Notes (Signed)
Virtual Visit via Video Note  I connected with Hailey Owen on 02/10/20 at 4:12 PM EDT by a video enabled telemedicine application and verified that I am speaking with the correct person using two identifiers.  Location: Patient: Home Provider: [facility] [place] office    I discussed the limitations of evaluation and management by telemedicine and the availability of in person appointments. The patient expressed understanding and agreed to proceed.   I provided 44  minutes of non-face-to-face time during this encounter.   Adah Salvage, LCSW     THERAPIST PROGRESS NOTE   Location: Patient-Home/ Provider  [facility] [place] office  Session Time: Wednesday 02/10/2020 4:12 PM - 4:56 PM  Participation Level: Active  Behavioral Response: CasualAlert/euthymic  Type of Therapy: Individual Therapy  Treatment Goals addressed: Learn and implement conflict resolution skills to resolve interpersonal problems/learn and implement relapse prevention strategies  Interventions: CBT and Supportive  Summary: Hailey Owen is a 49 y.o. female  who Is referred for services by psychiatrist Dr. Lolly Mustache due to patient experiencing symptoms of anxiety and depression. She is a returning patient to this clinician as she was seen in 2007 - 2008 due to experiencing symptoms of depression.    Patient last was seen about 2 months  ago.  She reports minimal depression and anxiety since last session.  However, she reports being more stressed in the past couple of weeks due to health concerns about her mother.  However she reports coping well with this using mindfulness skills, deep breathing, and spirituality.  She also talks to her mother and reports support from other family members as well.  She says she has experienced 1 panic attack since last session and says this occurred 2 months ago while at a concert.  She expresses sadness as one of her friends died by suicide this past 08-10-22.  She  verbalizes positive memories of her friend during session.  Patient reports positive self-care and says she is attending the gym 3 days/week.  She is pleased with her progress in treatment and expresses confidence in her ability to cope.  Patient reports misplacing previously mailed handout on mental health maintenance plan.   Suicidal/Homicidal: Nowithout intent/plan  Therapist Response: reviewed symptoms, discussed stressors, facilitated expression of thoughts and feelings, validated feelings, normalized feelings related to grief and loss issues, praised and reinforced patient's continued behavioral activation/use of support system/efforts to use mindfulness skills and grounding techniques, discussed patient's progress in treatment, assisted patient develop mental health maintenance plan, will send patient a hard copy via mail, did termination, encourage patient to contact this practice should she need psychotherapy services in the future, patient will continue to see psychiatrist Dr. Lolly Mustache for medication management   Plan:   Diagnosis: Axis I: MDD    Axis II: No diagnosis    Adah Salvage, LCSW 02/10/2020   Outpatient Therapist Discharge Summary  Hailey Owen    1971/01/07   Admission Date: 11/15/2016   Discharge Date:  02/10/2020 Reason for Discharge: Goals accomplished Diagnosis:  Axis I:  Major depressive disorder, recurrent episode, moderate (HCC)  Generalized anxiety disorder    Comments: Patient will continue to see psychiatrist Dr. Lolly Mustache for medication management. She is encouraged to contact this practice should she need psychotherapy services in the future.  Wateen Varon E Marciel Offenberger LCSW

## 2020-03-01 DIAGNOSIS — I10 Essential (primary) hypertension: Secondary | ICD-10-CM | POA: Diagnosis not present

## 2020-03-01 DIAGNOSIS — Z299 Encounter for prophylactic measures, unspecified: Secondary | ICD-10-CM | POA: Diagnosis not present

## 2020-03-01 DIAGNOSIS — F17210 Nicotine dependence, cigarettes, uncomplicated: Secondary | ICD-10-CM | POA: Diagnosis not present

## 2020-03-01 DIAGNOSIS — L0291 Cutaneous abscess, unspecified: Secondary | ICD-10-CM | POA: Diagnosis not present

## 2020-03-08 DIAGNOSIS — L720 Epidermal cyst: Secondary | ICD-10-CM | POA: Diagnosis not present

## 2020-03-08 DIAGNOSIS — Z01818 Encounter for other preprocedural examination: Secondary | ICD-10-CM | POA: Diagnosis not present

## 2020-03-08 DIAGNOSIS — N611 Abscess of the breast and nipple: Secondary | ICD-10-CM | POA: Diagnosis not present

## 2020-03-09 DIAGNOSIS — F17210 Nicotine dependence, cigarettes, uncomplicated: Secondary | ICD-10-CM | POA: Diagnosis not present

## 2020-03-09 DIAGNOSIS — L720 Epidermal cyst: Secondary | ICD-10-CM | POA: Diagnosis not present

## 2020-03-09 DIAGNOSIS — N6001 Solitary cyst of right breast: Secondary | ICD-10-CM | POA: Diagnosis not present

## 2020-03-09 DIAGNOSIS — R928 Other abnormal and inconclusive findings on diagnostic imaging of breast: Secondary | ICD-10-CM | POA: Diagnosis not present

## 2020-03-09 DIAGNOSIS — Z79899 Other long term (current) drug therapy: Secondary | ICD-10-CM | POA: Diagnosis not present

## 2020-03-09 DIAGNOSIS — N611 Abscess of the breast and nipple: Secondary | ICD-10-CM | POA: Diagnosis not present

## 2020-03-09 DIAGNOSIS — I10 Essential (primary) hypertension: Secondary | ICD-10-CM | POA: Diagnosis not present

## 2020-03-09 DIAGNOSIS — N6081 Other benign mammary dysplasias of right breast: Secondary | ICD-10-CM | POA: Diagnosis not present

## 2020-03-09 DIAGNOSIS — N6002 Solitary cyst of left breast: Secondary | ICD-10-CM | POA: Diagnosis not present

## 2020-03-09 DIAGNOSIS — N61 Mastitis without abscess: Secondary | ICD-10-CM | POA: Diagnosis not present

## 2020-03-14 ENCOUNTER — Other Ambulatory Visit (HOSPITAL_COMMUNITY): Payer: Self-pay | Admitting: Psychiatry

## 2020-03-14 DIAGNOSIS — F411 Generalized anxiety disorder: Secondary | ICD-10-CM

## 2020-03-14 DIAGNOSIS — F331 Major depressive disorder, recurrent, moderate: Secondary | ICD-10-CM

## 2020-03-15 ENCOUNTER — Other Ambulatory Visit (HOSPITAL_COMMUNITY): Payer: Self-pay | Admitting: *Deleted

## 2020-03-15 DIAGNOSIS — I10 Essential (primary) hypertension: Secondary | ICD-10-CM | POA: Diagnosis not present

## 2020-03-15 DIAGNOSIS — J329 Chronic sinusitis, unspecified: Secondary | ICD-10-CM | POA: Diagnosis not present

## 2020-03-15 DIAGNOSIS — Z299 Encounter for prophylactic measures, unspecified: Secondary | ICD-10-CM | POA: Diagnosis not present

## 2020-03-15 DIAGNOSIS — F17210 Nicotine dependence, cigarettes, uncomplicated: Secondary | ICD-10-CM | POA: Diagnosis not present

## 2020-03-15 DIAGNOSIS — F411 Generalized anxiety disorder: Secondary | ICD-10-CM

## 2020-03-15 DIAGNOSIS — F331 Major depressive disorder, recurrent, moderate: Secondary | ICD-10-CM

## 2020-03-15 DIAGNOSIS — B9689 Other specified bacterial agents as the cause of diseases classified elsewhere: Secondary | ICD-10-CM | POA: Diagnosis not present

## 2020-03-15 MED ORDER — VENLAFAXINE HCL ER 75 MG PO CP24: 225.0000 mg | ORAL_CAPSULE | Freq: Every day | ORAL | 2 refills | Status: DC

## 2020-03-31 DIAGNOSIS — F17210 Nicotine dependence, cigarettes, uncomplicated: Secondary | ICD-10-CM | POA: Diagnosis not present

## 2020-03-31 DIAGNOSIS — E785 Hyperlipidemia, unspecified: Secondary | ICD-10-CM | POA: Diagnosis not present

## 2020-03-31 DIAGNOSIS — Z299 Encounter for prophylactic measures, unspecified: Secondary | ICD-10-CM | POA: Diagnosis not present

## 2020-03-31 DIAGNOSIS — G43909 Migraine, unspecified, not intractable, without status migrainosus: Secondary | ICD-10-CM | POA: Diagnosis not present

## 2020-03-31 DIAGNOSIS — T8131XA Disruption of external operation (surgical) wound, not elsewhere classified, initial encounter: Secondary | ICD-10-CM | POA: Diagnosis not present

## 2020-03-31 DIAGNOSIS — I10 Essential (primary) hypertension: Secondary | ICD-10-CM | POA: Diagnosis not present

## 2020-04-06 DIAGNOSIS — R110 Nausea: Secondary | ICD-10-CM | POA: Diagnosis not present

## 2020-04-06 DIAGNOSIS — Z79899 Other long term (current) drug therapy: Secondary | ICD-10-CM | POA: Diagnosis not present

## 2020-04-06 DIAGNOSIS — I10 Essential (primary) hypertension: Secondary | ICD-10-CM | POA: Diagnosis not present

## 2020-04-06 DIAGNOSIS — F17210 Nicotine dependence, cigarettes, uncomplicated: Secondary | ICD-10-CM | POA: Diagnosis not present

## 2020-04-06 DIAGNOSIS — N611 Abscess of the breast and nipple: Secondary | ICD-10-CM | POA: Diagnosis not present

## 2020-04-06 DIAGNOSIS — T8130XA Disruption of wound, unspecified, initial encounter: Secondary | ICD-10-CM | POA: Diagnosis not present

## 2020-04-06 DIAGNOSIS — L720 Epidermal cyst: Secondary | ICD-10-CM | POA: Diagnosis not present

## 2020-04-07 DIAGNOSIS — N6001 Solitary cyst of right breast: Secondary | ICD-10-CM | POA: Diagnosis not present

## 2020-04-21 ENCOUNTER — Other Ambulatory Visit: Payer: Self-pay

## 2020-04-21 ENCOUNTER — Telehealth (INDEPENDENT_AMBULATORY_CARE_PROVIDER_SITE_OTHER): Payer: Medicare Other | Admitting: Psychiatry

## 2020-04-21 ENCOUNTER — Encounter (HOSPITAL_COMMUNITY): Payer: Self-pay | Admitting: Psychiatry

## 2020-04-21 DIAGNOSIS — F331 Major depressive disorder, recurrent, moderate: Secondary | ICD-10-CM | POA: Diagnosis not present

## 2020-04-21 DIAGNOSIS — F411 Generalized anxiety disorder: Secondary | ICD-10-CM

## 2020-04-21 MED ORDER — VENLAFAXINE HCL ER 150 MG PO CP24: 150.0000 mg | ORAL_CAPSULE | Freq: Every day | ORAL | 2 refills | Status: DC

## 2020-04-21 MED ORDER — ARIPIPRAZOLE 5 MG PO TABS: 5.0000 mg | ORAL_TABLET | Freq: Every day | ORAL | 0 refills | Status: DC

## 2020-04-21 MED ORDER — LORAZEPAM 0.5 MG PO TABS: ORAL_TABLET | ORAL | 2 refills | Status: DC

## 2020-04-21 MED ORDER — LAMOTRIGINE 150 MG PO TABS: 150.0000 mg | ORAL_TABLET | Freq: Two times a day (BID) | ORAL | 0 refills | Status: DC
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
## 2020-04-22 DIAGNOSIS — I10 Essential (primary) hypertension: Secondary | ICD-10-CM | POA: Diagnosis not present

## 2020-04-22 DIAGNOSIS — E7849 Other hyperlipidemia: Secondary | ICD-10-CM | POA: Diagnosis not present

## 2020-04-23 HISTORY — PX: COLONOSCOPY: SHX174

## 2020-04-25 ENCOUNTER — Other Ambulatory Visit (HOSPITAL_COMMUNITY): Payer: Self-pay | Admitting: Psychiatry

## 2020-04-25 DIAGNOSIS — F411 Generalized anxiety disorder: Secondary | ICD-10-CM

## 2020-04-26 ENCOUNTER — Telehealth (HOSPITAL_COMMUNITY): Payer: Medicare Other | Admitting: Psychiatry

## 2020-04-27 DIAGNOSIS — T8130XA Disruption of wound, unspecified, initial encounter: Secondary | ICD-10-CM | POA: Diagnosis not present

## 2020-04-27 DIAGNOSIS — I10 Essential (primary) hypertension: Secondary | ICD-10-CM | POA: Diagnosis not present

## 2020-04-27 DIAGNOSIS — R110 Nausea: Secondary | ICD-10-CM | POA: Diagnosis not present

## 2020-04-27 DIAGNOSIS — N611 Abscess of the breast and nipple: Secondary | ICD-10-CM | POA: Diagnosis not present

## 2020-04-27 DIAGNOSIS — Z79899 Other long term (current) drug therapy: Secondary | ICD-10-CM | POA: Diagnosis not present

## 2020-04-27 DIAGNOSIS — F17210 Nicotine dependence, cigarettes, uncomplicated: Secondary | ICD-10-CM | POA: Diagnosis not present

## 2020-04-27 DIAGNOSIS — L720 Epidermal cyst: Secondary | ICD-10-CM | POA: Diagnosis not present

## 2020-04-28 DIAGNOSIS — Z299 Encounter for prophylactic measures, unspecified: Secondary | ICD-10-CM | POA: Diagnosis not present

## 2020-04-28 DIAGNOSIS — R569 Unspecified convulsions: Secondary | ICD-10-CM | POA: Diagnosis not present

## 2020-04-28 DIAGNOSIS — I10 Essential (primary) hypertension: Secondary | ICD-10-CM | POA: Diagnosis not present

## 2020-04-28 DIAGNOSIS — J069 Acute upper respiratory infection, unspecified: Secondary | ICD-10-CM | POA: Diagnosis not present

## 2020-04-28 DIAGNOSIS — F17210 Nicotine dependence, cigarettes, uncomplicated: Secondary | ICD-10-CM | POA: Diagnosis not present

## 2020-05-04 ENCOUNTER — Telehealth (HOSPITAL_COMMUNITY): Payer: Self-pay | Admitting: *Deleted

## 2020-05-04 NOTE — Telephone Encounter (Signed)
Pt called asking if she can take a medication for bruxism. I see that she is already on Ativan which she says she has taken an extra one but does not help. Pt states that she has cracked several teeth feeling like she can't bite down hard enough. Pt did acknowledge that she has a nightguard from dentist but her gag reflex is too strong so she hasn't been able to use it. Pt asked about medication she's seen advertised for teeth grinding. Seems to be Austedo which nurse advised is for TD. Pt says yes. Writer asked if she were having any other symptoms as that medication is not for bruxism alone. Pt then stated "picking sores on my face"  And "biting my tongue" when asked about rolling of tongue. Writer advised calling her dentist as well. Pt requests input from you. Please review and advise.

## 2020-05-05 ENCOUNTER — Other Ambulatory Visit (HOSPITAL_COMMUNITY): Payer: Self-pay | Admitting: *Deleted

## 2020-05-05 ENCOUNTER — Telehealth (HOSPITAL_COMMUNITY): Payer: Self-pay | Admitting: *Deleted

## 2020-05-05 MED ORDER — BENZTROPINE MESYLATE 1 MG PO TABS: 1.0000 mg | ORAL_TABLET | Freq: Every day | ORAL | 0 refills | Status: DC

## 2020-05-05 NOTE — Telephone Encounter (Signed)
She should contact her dentist.  We can try Cogentin 1 mg daily and if she agrees please call her local pharmacy.

## 2020-05-05 NOTE — Telephone Encounter (Signed)
Writer left VM for pt regarding starting the Cogentin 1 mg for c/o bruxism and biting tongue. Brief med ed initiated and pt encouraged to call this nurse back with any questions or concerns.

## 2020-05-12 DIAGNOSIS — I10 Essential (primary) hypertension: Secondary | ICD-10-CM | POA: Diagnosis not present

## 2020-05-12 DIAGNOSIS — M1611 Unilateral primary osteoarthritis, right hip: Secondary | ICD-10-CM | POA: Diagnosis not present

## 2020-05-12 DIAGNOSIS — M25551 Pain in right hip: Secondary | ICD-10-CM | POA: Diagnosis not present

## 2020-05-12 DIAGNOSIS — M47816 Spondylosis without myelopathy or radiculopathy, lumbar region: Secondary | ICD-10-CM | POA: Diagnosis not present

## 2020-05-12 DIAGNOSIS — Z299 Encounter for prophylactic measures, unspecified: Secondary | ICD-10-CM | POA: Diagnosis not present

## 2020-05-12 DIAGNOSIS — M160 Bilateral primary osteoarthritis of hip: Secondary | ICD-10-CM | POA: Diagnosis not present

## 2020-05-12 DIAGNOSIS — M545 Low back pain, unspecified: Secondary | ICD-10-CM | POA: Diagnosis not present

## 2020-05-24 ENCOUNTER — Other Ambulatory Visit (HOSPITAL_COMMUNITY): Payer: Self-pay | Admitting: Psychiatry

## 2020-05-26 ENCOUNTER — Telehealth (HOSPITAL_COMMUNITY): Payer: Self-pay

## 2020-05-27 ENCOUNTER — Telehealth (HOSPITAL_COMMUNITY): Payer: Self-pay

## 2020-05-27 MED ORDER — BENZTROPINE MESYLATE 1 MG PO TABS: 1.0000 mg | ORAL_TABLET | Freq: Every day | ORAL | 2 refills | Status: DC

## 2020-05-27 NOTE — Telephone Encounter (Signed)
error 

## 2020-05-27 NOTE — Telephone Encounter (Signed)
Received a fax from pharmacy requesting a refill on patient's Benztropine 1mg . She uses [facility] on [address] in [place]. Follow up scheduled for 3/22. Thank you

## 2020-05-27 NOTE — Telephone Encounter (Signed)
Done

## 2020-05-27 NOTE — Telephone Encounter (Signed)
Thank you :)

## 2020-06-20 DIAGNOSIS — F17210 Nicotine dependence, cigarettes, uncomplicated: Secondary | ICD-10-CM | POA: Diagnosis not present

## 2020-06-20 DIAGNOSIS — M7061 Trochanteric bursitis, right hip: Secondary | ICD-10-CM | POA: Diagnosis not present

## 2020-06-20 DIAGNOSIS — I10 Essential (primary) hypertension: Secondary | ICD-10-CM | POA: Diagnosis not present

## 2020-06-20 DIAGNOSIS — M461 Sacroiliitis, not elsewhere classified: Secondary | ICD-10-CM | POA: Diagnosis not present

## 2020-06-20 DIAGNOSIS — Z299 Encounter for prophylactic measures, unspecified: Secondary | ICD-10-CM | POA: Diagnosis not present

## 2020-06-23 DIAGNOSIS — I10 Essential (primary) hypertension: Secondary | ICD-10-CM | POA: Diagnosis not present

## 2020-06-23 DIAGNOSIS — M7061 Trochanteric bursitis, right hip: Secondary | ICD-10-CM | POA: Diagnosis not present

## 2020-06-23 DIAGNOSIS — F17210 Nicotine dependence, cigarettes, uncomplicated: Secondary | ICD-10-CM | POA: Diagnosis not present

## 2020-06-23 DIAGNOSIS — Z299 Encounter for prophylactic measures, unspecified: Secondary | ICD-10-CM | POA: Diagnosis not present

## 2020-07-01 DIAGNOSIS — F17210 Nicotine dependence, cigarettes, uncomplicated: Secondary | ICD-10-CM | POA: Diagnosis not present

## 2020-07-01 DIAGNOSIS — Z299 Encounter for prophylactic measures, unspecified: Secondary | ICD-10-CM | POA: Diagnosis not present

## 2020-07-01 DIAGNOSIS — I10 Essential (primary) hypertension: Secondary | ICD-10-CM | POA: Diagnosis not present

## 2020-07-01 DIAGNOSIS — M25551 Pain in right hip: Secondary | ICD-10-CM | POA: Diagnosis not present

## 2020-07-01 DIAGNOSIS — M25569 Pain in unspecified knee: Secondary | ICD-10-CM | POA: Diagnosis not present

## 2020-07-12 ENCOUNTER — Telehealth (INDEPENDENT_AMBULATORY_CARE_PROVIDER_SITE_OTHER): Payer: Medicare Other | Admitting: Psychiatry

## 2020-07-12 ENCOUNTER — Encounter (HOSPITAL_COMMUNITY): Payer: Self-pay | Admitting: Psychiatry

## 2020-07-12 ENCOUNTER — Other Ambulatory Visit: Payer: Self-pay

## 2020-07-12 VITALS — Wt 221.0 lb

## 2020-07-12 DIAGNOSIS — F411 Generalized anxiety disorder: Secondary | ICD-10-CM | POA: Diagnosis not present

## 2020-07-12 DIAGNOSIS — F331 Major depressive disorder, recurrent, moderate: Secondary | ICD-10-CM | POA: Diagnosis not present

## 2020-07-12 MED ORDER — LORAZEPAM 0.5 MG PO TABS: ORAL_TABLET | ORAL | 2 refills | Status: DC

## 2020-07-12 MED ORDER — LAMOTRIGINE 150 MG PO TABS
150.0000 mg | ORAL_TABLET | Freq: Two times a day (BID) | ORAL | 0 refills | Status: DC
Start: 2020-07-12 — End: 2020-09-08

## 2020-07-12 MED ORDER — BENZTROPINE MESYLATE 1 MG PO TABS: 1.0000 mg | ORAL_TABLET | Freq: Every day | ORAL | 0 refills | Status: DC

## 2020-07-12 MED ORDER — ARIPIPRAZOLE 5 MG PO TABS: 5.0000 mg | ORAL_TABLET | Freq: Every day | ORAL | 0 refills | Status: DC
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
## 2020-07-20 DIAGNOSIS — J40 Bronchitis, not specified as acute or chronic: Secondary | ICD-10-CM | POA: Diagnosis not present

## 2020-07-20 DIAGNOSIS — E7849 Other hyperlipidemia: Secondary | ICD-10-CM | POA: Diagnosis not present

## 2020-07-24 ENCOUNTER — Other Ambulatory Visit (HOSPITAL_COMMUNITY): Payer: Self-pay | Admitting: Psychiatry

## 2020-07-24 DIAGNOSIS — F331 Major depressive disorder, recurrent, moderate: Secondary | ICD-10-CM

## 2020-07-24 DIAGNOSIS — F411 Generalized anxiety disorder: Secondary | ICD-10-CM

## 2020-07-26 DIAGNOSIS — Z7189 Other specified counseling: Secondary | ICD-10-CM | POA: Diagnosis not present

## 2020-07-26 DIAGNOSIS — I10 Essential (primary) hypertension: Secondary | ICD-10-CM | POA: Diagnosis not present

## 2020-07-26 DIAGNOSIS — Z299 Encounter for prophylactic measures, unspecified: Secondary | ICD-10-CM | POA: Diagnosis not present

## 2020-07-26 DIAGNOSIS — F17210 Nicotine dependence, cigarettes, uncomplicated: Secondary | ICD-10-CM | POA: Diagnosis not present

## 2020-07-26 DIAGNOSIS — R5383 Other fatigue: Secondary | ICD-10-CM | POA: Diagnosis not present

## 2020-07-26 DIAGNOSIS — E785 Hyperlipidemia, unspecified: Secondary | ICD-10-CM | POA: Diagnosis not present

## 2020-07-26 DIAGNOSIS — Z0000 Encounter for general adult medical examination without abnormal findings: Secondary | ICD-10-CM | POA: Diagnosis not present

## 2020-07-26 DIAGNOSIS — Z79899 Other long term (current) drug therapy: Secondary | ICD-10-CM | POA: Diagnosis not present

## 2020-08-15 ENCOUNTER — Other Ambulatory Visit (HOSPITAL_COMMUNITY): Payer: Self-pay | Admitting: *Deleted

## 2020-08-15 ENCOUNTER — Telehealth (HOSPITAL_COMMUNITY): Payer: Self-pay | Admitting: *Deleted

## 2020-08-15 DIAGNOSIS — H40033 Anatomical narrow angle, bilateral: Secondary | ICD-10-CM | POA: Diagnosis not present

## 2020-08-15 DIAGNOSIS — H2513 Age-related nuclear cataract, bilateral: Secondary | ICD-10-CM | POA: Diagnosis not present

## 2020-08-15 MED ORDER — VENLAFAXINE HCL ER 37.5 MG PO CP24: 37.5000 mg | ORAL_CAPSULE | Freq: Every day | ORAL | 1 refills | Status: DC

## 2020-08-15 NOTE — Telephone Encounter (Signed)
Please add 37.5 mg venlafaxine with existing dose of 150 mg daily.

## 2020-08-15 NOTE — Telephone Encounter (Signed)
Pt called requesting to increase dose of Effexor, currently on 150 mg XR since 12/21. Pt states that she having "attacks again". She has an upcoming appointment on 10/12/20. Please review and advise. Thanks.

## 2020-08-15 NOTE — Telephone Encounter (Signed)
Will do and will let her know.

## 2020-08-22 DIAGNOSIS — Z1211 Encounter for screening for malignant neoplasm of colon: Secondary | ICD-10-CM | POA: Diagnosis not present

## 2020-08-30 DIAGNOSIS — H04123 Dry eye syndrome of bilateral lacrimal glands: Secondary | ICD-10-CM | POA: Diagnosis not present

## 2020-09-08 ENCOUNTER — Other Ambulatory Visit: Payer: Self-pay

## 2020-09-08 ENCOUNTER — Encounter (HOSPITAL_COMMUNITY): Payer: Self-pay | Admitting: Psychiatry

## 2020-09-08 ENCOUNTER — Ambulatory Visit (INDEPENDENT_AMBULATORY_CARE_PROVIDER_SITE_OTHER): Payer: Medicare Other | Admitting: Psychiatry

## 2020-09-08 DIAGNOSIS — F331 Major depressive disorder, recurrent, moderate: Secondary | ICD-10-CM

## 2020-09-08 DIAGNOSIS — F411 Generalized anxiety disorder: Secondary | ICD-10-CM | POA: Diagnosis not present

## 2020-09-08 MED ORDER — LORAZEPAM 0.5 MG PO TABS: 0.5000 mg | ORAL_TABLET | Freq: Two times a day (BID) | ORAL | 1 refills | Status: DC

## 2020-09-08 MED ORDER — LAMOTRIGINE 150 MG PO TABS
150.0000 mg | ORAL_TABLET | Freq: Two times a day (BID) | ORAL | 0 refills | Status: DC
Start: 2020-09-08 — End: 2020-10-12

## 2020-09-08 MED ORDER — VENLAFAXINE HCL ER 150 MG PO CP24
150.0000 mg | ORAL_CAPSULE | Freq: Every day | ORAL | 1 refills | Status: DC
Start: 2020-09-08 — End: 2020-10-12

## 2020-09-08 MED ORDER — ARIPIPRAZOLE 5 MG PO TABS: 5.0000 mg | ORAL_TABLET | Freq: Every day | ORAL | 0 refills | Status: DC

## 2020-09-08 MED ORDER — VENLAFAXINE HCL ER 37.5 MG PO CP24: 37.5000 mg | ORAL_CAPSULE | Freq: Every day | ORAL | 0 refills | Status: DC

## 2020-09-08 MED ORDER — BENZTROPINE MESYLATE 1 MG PO TABS: 1.0000 mg | ORAL_TABLET | Freq: Every day | ORAL | 0 refills | Status: DC
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
## 2020-09-12 ENCOUNTER — Telehealth (HOSPITAL_COMMUNITY): Payer: Self-pay | Admitting: *Deleted

## 2020-09-12 NOTE — Telephone Encounter (Signed)
This is blood pressure medication and has to be monitor by PCP after checking vitals. She should get her vitals when she has these episodes and inform PCP. We have recommended neuro consult too on last visit.

## 2020-09-12 NOTE — Telephone Encounter (Signed)
Writer left VM on pt home phone as requested by mother, to assure that Dr. Lolly Mustache did not change frequency of the Lisinopril/HCTZ. Writer advised that pt or mother should contact PCP when having these episodes of feeling like "she's drunk". Also continue to monitor BP. Pt/mother encouraged to call this office as needed.

## 2020-09-12 NOTE — Telephone Encounter (Signed)
I did mention neuro consult. Bp this morning was 100/70. I will call PCP and inform that we did not change Lisinopril frequency.

## 2020-09-12 NOTE — Telephone Encounter (Signed)
Writer spoke with Hailey Owen mother, who is on contact list, with Hailey Owen in background. Mother states that Hailey Owen has been "woozy" and seems "drunk" since starting new medication, Effexor 37.5 mg. However Hailey Owen has increased Lisinopril/HCTZ 20-12.5mg  to bid per AVS from last visit here on 09/08/20. The only script for that medication appears to be in 2017 with that frequency. Per mother Hailey Owen has been only taking 1 tab qhs as ordered by PCP Dr. Sherril Croon. Mother advised to call PCP however they wanted to verify with you first. Please review and advise. Thanks.

## 2020-09-14 ENCOUNTER — Encounter: Payer: Self-pay | Admitting: Neurology

## 2020-09-19 DIAGNOSIS — I10 Essential (primary) hypertension: Secondary | ICD-10-CM | POA: Diagnosis not present

## 2020-09-19 DIAGNOSIS — E7849 Other hyperlipidemia: Secondary | ICD-10-CM | POA: Diagnosis not present

## 2020-09-19 DIAGNOSIS — E1165 Type 2 diabetes mellitus with hyperglycemia: Secondary | ICD-10-CM | POA: Diagnosis not present

## 2020-09-20 ENCOUNTER — Ambulatory Visit (INDEPENDENT_AMBULATORY_CARE_PROVIDER_SITE_OTHER): Payer: Medicare Other | Admitting: Neurology

## 2020-09-20 ENCOUNTER — Encounter: Payer: Self-pay | Admitting: Neurology

## 2020-09-20 ENCOUNTER — Other Ambulatory Visit: Payer: Self-pay

## 2020-09-20 VITALS — BP 123/75 | HR 90 | Resp 20 | Ht 67.0 in | Wt 229.0 lb

## 2020-09-20 DIAGNOSIS — R519 Headache, unspecified: Secondary | ICD-10-CM

## 2020-09-20 DIAGNOSIS — F445 Conversion disorder with seizures or convulsions: Secondary | ICD-10-CM

## 2020-09-20 DIAGNOSIS — R404 Transient alteration of awareness: Secondary | ICD-10-CM | POA: Diagnosis not present

## 2020-09-20 MED ORDER — TOPIRAMATE 25 MG PO TABS: ORAL_TABLET | ORAL | 6 refills | Status: DC

## 2020-09-20 NOTE — Progress Notes (Signed)
NEUROLOGY CONSULTATION NOTE  Hailey Owen MRN: [id number] DOB: 04/14/1971  Referring provider: Dr. Kathryne Sharper Primary care provider: Dr. Doreen Beam  Reason for consult:  Seizure-like activity  Dear Dr Lolly Mustache:  Thank you for your kind referral of Hailey Owen for consultation of the above symptoms. Although her history is well known to you, please allow me to reiterate it for the purpose of our medical record. The patient was accompanied to the clinic by her mother who also provides collateral information. Records and images were personally reviewed where available.  HISTORY OF PRESENT ILLNESS: This is a 50 year old right-handed woman with a history of hyperlipidemia, anxiety, depression, psychogenic non-epileptic events, presenting for evaluation of zoning out and headaches. Records were reviewed. She had previously seen neurologist Dr. Marjory Lies from 2013 to 2017, in 2013 she presented with stuttering speech, memory problems, tension in arms and legs, mild convulsions. She had a normal MRI and EEG, and in 2015 reported episodes of eyes rolling back, whole body stiffening, followed by confusion and fatigue. Video EEG at [facility] in 2015 was normal, she had typical spells and and was diagnosed with non-epileptic events. On her follow-up in 2017, she reported another episode of unresponsiveness, snorting, blue around the mouth and was put on Keppra, which was discontinued as episode was felt due to syncope.   She presents today with her mother who helps provide additional information. She states zoning out started in 2015, her mother reports that the shaking spells quit and she would have them once in a blue moon, however last month "they came out of nowhere." They are not as intense as her spells in the past. She states the episodes start where she feels like she cannot breathe, she gets hot, then her body goes to shaking. She can see her body shaking, her hands and fee would jerk, someimes she would  lose awareness. A week ago, her mother saw that her hands and feet would straighten out. She usually sits/lays down and the episode ends in 5-10 minutes. Her mother puts a Environmental manager on her face. She states she has bitten her tongue and had urinary incontinence several times. No falls. She describes symptoms of feeling swimmy-headed, sick to her stomach, stuttering, "cotton mouth," walking gets "fumbly." Her mother notes she gets up too quick and gets swimmy-headed. She states she is depressed today and could not fill out her paperwork right. She had been managing her own medications until 2 weeks ago when changes were made and she got tangled up taking them all at one time instead of BID. Her mother now helps with medications, she states lorazepam has been increased to BID, Effexor dose increased, and Cogentin added. She states she has always had headaches, they are the same as they have always been, with pressure in the frontal and occipital regions. She would get nauseated and would gag for 3-4 rounds. She is sensitive to lights. She takes Tylenol which helps. She also takes Tylenol daily for right hip arthritis. She will be seeing Ortho soon. She recalls taking Topiramate for migraines in the past which she felt helped, but she stopped due to fear of possible side effect that she read where it can cause blood clots. She takes melatonin which helps with sleep. She states she stopped seeing her therapist over a month ago because she was feeling well. They report her father had grand mal seizures and took 2 seizure medications. Her sister had a single seizure. Otherwise she had  a normal birth and early development.  There is no history of febrile convulsions, CNS infections such as meningitis/encephalitis, significant traumatic brain injury, neurosurgical procedures.  Prior ASMs: lamotrigine (for mood), Keppra, Topamax (for migraines)   PAST MEDICAL HISTORY: Past Medical History:  Diagnosis Date  .  Anxiety   . Depression   . GERD (gastroesophageal reflux disease)   . Headache(784.0)   . Hernia   . Hyperlipemia   . Panic attack   . Sleep apnea     PAST SURGICAL HISTORY: Past Surgical History:  Procedure Laterality Date  . ABDOMINAL HYSTERECTOMY    . APPENDECTOMY    . MOUTH SURGERY    . MYRINGOTOMY WITH TUBE PLACEMENT Right 09/30/2018   Procedure: MYRINGOTOMY WITH RIGHT TUBE PLACEMENT;  Surgeon: Newman Pies, MD;  Location: [facility];  Service: ENT;  Laterality: Right;    MEDICATIONS: Current Outpatient Medications on File Prior to Visit  Medication Sig Dispense Refill  . ARIPiprazole (ABILIFY) 5 MG tablet Take 1 tablet (5 mg total) by mouth daily. 90 tablet 0  . benztropine (COGENTIN) 1 MG tablet Take 1 tablet (1 mg total) by mouth daily. 90 tablet 0  . lamoTRIgine (LAMICTAL) 150 MG tablet Take 1 tablet (150 mg total) by mouth 2 (two) times daily. 180 tablet 0  . lisinopril-hydrochlorothiazide (PRINZIDE,ZESTORETIC) 20-12.5 MG tablet Take 1 tablet by mouth 2 (two) times daily.     Marland Kitchen LORazepam (ATIVAN) 0.5 MG tablet Take 1 tablet (0.5 mg total) by mouth 2 (two) times daily. 60 tablet 1  . rosuvastatin (CRESTOR) 20 MG tablet     . venlafaxine XR (EFFEXOR XR) 37.5 MG 24 hr capsule Take 1 capsule (37.5 mg total) by mouth daily. 90 capsule 0  . venlafaxine XR (EFFEXOR-XR) 150 MG 24 hr capsule Take 1 capsule (150 mg total) by mouth daily. 90 capsule 1   No current facility-administered medications on file prior to visit.    ALLERGIES: Allergies  Allergen Reactions  . Sulfa Antibiotics Other (See Comments)    REACTION: "Shut down immune system with nausea and vomiting, dehydration" was given for bladder infection, resulted in hospitalization  . Septra [Sulfamethoxazole-Trimethoprim]     FAMILY HISTORY: Family History  Problem Relation Age of Onset  . Alcohol abuse Father   . Depression Father   . Seizures Father   . Depression Sister   . Anxiety disorder  Sister   . Bipolar disorder Sister   . Atrial fibrillation Mother     SOCIAL HISTORY: Social History   Socioeconomic History  . Marital status: Divorced    Spouse name: Not on file  . Number of children: 0  . Years of education: College  . Highest education level: Not on file  Occupational History  . Occupation: Electrical engineer: [employer]  Tobacco Use  . Smoking status: Current Every Day Smoker    Packs/day: 1.00    Years: 30.00    Pack years: 30.00    Types: Cigarettes  . Smokeless tobacco: Never Used  Vaping Use  . Vaping Use: Former  Substance and Sexual Activity  . Alcohol use: Not Currently  . Drug use: No  . Sexual activity: Not Currently    Birth control/protection: Surgical  Other Topics Concern  . Not on file  Social History Narrative   Patient lives at home with mother.   Caffeine Use:1 cup of coffee daily; 3 sodas weekly   Right-handed   Social Determinants of Health  Financial Resource Strain: Not on file  Food Insecurity: Not on file  Transportation Needs: Not on file  Physical Activity: Not on file  Stress: Not on file  Social Connections: Not on file  Intimate Partner Violence: Not on file     PHYSICAL EXAM: Vitals:   09/20/20 0858  BP: 123/75  Pulse: 90  Resp: 20  SpO2: 96%   General: No acute distress, tearful at times Head:  Normocephalic/atraumatic Skin/Extremities: No rash, no edema Neurological Exam: Mental status: alert and awake, no dysarthria or aphasia, Fund of knowledge is appropriate. Attention and concentration are normal.    Cranial nerves: CN I: not tested CN II: pupils equal, round and reactive to light, visual fields intact CN III, IV, VI:  full range of motion, no nystagmus, no ptosis CN V: facial sensation intact CN VII: upper and lower face symmetric CN VIII: hearing intact to conversation Bulk & Tone: normal, no fasciculations. Motor: 5/5 throughout with no pronator drift. Sensation: intact to  light touch, cold, vibration sense.  No extinction to double simultaneous stimulation.  Romberg test negative Deep Tendon Reflexes: +2 throughout Cerebellar: no incoordination on finger to nose testing Gait: slow and cautious favoring right leg due to hip pain Tremor: none   IMPRESSION: This is a 50 year old right-handed woman with a history of hyperlipidemia, anxiety, depression, psychogenic non-epileptic events, presenting for evaluation of zoning out and headaches. History and prior records reviewed with patient and her mother. Discussed that episodes are again concerning for psychogenic non-epileptic events (PNES), we discussed PNES and results of testing at [facility], she states "I've never heard of that (PNES)." Discussed different types of seizures and management, including cognitive behavioral therapy for PNES. We will repeat 1-hour EEG. She is reporting an increase in headaches, likely due to mood issues as well, MRI brain without contrast will be done to assess for underlying structural abnormality. She is agreeable to restarting Topiramate 25mg  qhs for headache prophylaxis, side effects discussed. [place] driving laws were discussed with the patient, and she knows to stop driving after an episode of loss of consciousness until 6 months event-free. Follow-up in 4-5 months, call for any changes.   Thank you for allowing me to participate in the care of this patient. Please do not hesitate to call for any questions or concerns.   , M.D.  CC: Dr. Patrcia Dolly, Dr. Lolly Mustache

## 2020-09-20 NOTE — Progress Notes (Unsigned)
Precertification Not Required 

## 2020-09-20 NOTE — Patient Instructions (Addendum)
1. Schedule open MRI brain without contrast  2. Schedule 1-hour EEG  3. Start Topamax 25mg : take 1 tablet every night to help with the headaches. Try to minimize Tylenol intake to 2-3 time a week, otherwise taking this daily can cause more headaches  4. Discuss doing Cognitive Behavioral Therapy with to help with stress seizures  5. Folllow-up in 4 months  We have sent a referral to [facility] for your MRI and they will call you directly to schedule your appointment. They are located at [address] [facility]. If you need to contact them directly please call [phone].

## 2020-09-21 DIAGNOSIS — H04123 Dry eye syndrome of bilateral lacrimal glands: Secondary | ICD-10-CM | POA: Diagnosis not present

## 2020-09-21 HISTORY — PX: CYSTECTOMY: SUR359

## 2020-09-26 ENCOUNTER — Other Ambulatory Visit: Payer: Medicare Other

## 2020-09-28 ENCOUNTER — Telehealth: Payer: Self-pay | Admitting: Neurology

## 2020-09-28 NOTE — Telephone Encounter (Signed)
Patient's mom Natasha Mead (no DPR on file to share PHI with) called and said the patient was not able to complete her MRI today.   She said she has POA but could not confirm we have that information. She will call back later with the patient.  She said the patient will need to have the test at the hospital and be sedated. FYI only.

## 2020-09-29 DIAGNOSIS — J029 Acute pharyngitis, unspecified: Secondary | ICD-10-CM | POA: Diagnosis not present

## 2020-09-29 DIAGNOSIS — I10 Essential (primary) hypertension: Secondary | ICD-10-CM | POA: Diagnosis not present

## 2020-09-29 DIAGNOSIS — F17210 Nicotine dependence, cigarettes, uncomplicated: Secondary | ICD-10-CM | POA: Diagnosis not present

## 2020-09-29 DIAGNOSIS — Z299 Encounter for prophylactic measures, unspecified: Secondary | ICD-10-CM | POA: Diagnosis not present

## 2020-09-30 ENCOUNTER — Other Ambulatory Visit: Payer: Self-pay

## 2020-09-30 DIAGNOSIS — R404 Transient alteration of awareness: Secondary | ICD-10-CM

## 2020-09-30 DIAGNOSIS — R519 Headache, unspecified: Secondary | ICD-10-CM

## 2020-09-30 NOTE — Telephone Encounter (Signed)
Mri with sedation order placed

## 2020-09-30 NOTE — Telephone Encounter (Signed)
Pls order MRI under sedation and hopefully can be done before end of June, thanks

## 2020-10-04 ENCOUNTER — Ambulatory Visit: Payer: Medicare Other | Admitting: Neurology

## 2020-10-04 ENCOUNTER — Other Ambulatory Visit: Payer: Self-pay

## 2020-10-04 ENCOUNTER — Telehealth: Payer: Self-pay | Admitting: *Deleted

## 2020-10-04 DIAGNOSIS — R404 Transient alteration of awareness: Secondary | ICD-10-CM

## 2020-10-04 DIAGNOSIS — R519 Headache, unspecified: Secondary | ICD-10-CM | POA: Diagnosis not present

## 2020-10-04 NOTE — Telephone Encounter (Signed)
Can you pls confirm with patient, she had called last week asking for MRI in hospital under sedation. Did she want to try open MRI with Valium or just go ahead with sedation in the hospital? Thanks

## 2020-10-04 NOTE — Telephone Encounter (Signed)
Noted, we already sent order, will they be calling her or should she be given number to call? thanks

## 2020-10-04 NOTE — Telephone Encounter (Signed)
While she is here for EEG asked that I let you know she was not able to complete MRI she was claustrophobic and wants to know if you can give her something for this?

## 2020-10-04 NOTE — Procedures (Signed)
ELECTROENCEPHALOGRAM REPORT  Date of Study: 10/04/2020  Patient's Name: Hailey Owen MRN: [id number] Date of Birth: July 08, 1970  Referring Provider: Dr. Patrcia Dolly  Clinical History: This is a 50 year old woman with a history of psychogenic shaking episodes with an increase in episodes of zoning out. EEG for classification.   Medications: LAMICTAL 150 MG tablet ATIVAN 0.5 MG tablet ABILIFY 5 MG tablet COGENTIN 1 MG tablet PRINZIDE,ZESTORETIC 20-12.5 MG tablet CRESTOR 20 MG tablet EFFEXOR XR 37.5 MG 24 hr capsule EFFEXOR-XR 150 MG 24 hr capsule   Technical Summary: A multichannel digital 1-hour EEG recording measured by the international 10-20 system with electrodes applied with paste and impedances below 5000 ohms performed in our laboratory with EKG monitoring in an awake and asleep patient.  Hyperventilation was not performed. Photic stimulation was performed.  The digital EEG was referentially recorded, reformatted, and digitally filtered in a variety of bipolar and referential montages for optimal display.    Description: The patient is awake and asleep during the recording.  During maximal wakefulness, there is a symmetric, medium voltage 10 Hz posterior dominant rhythm that attenuates with eye opening.  The record is symmetric.  During drowsiness and sleep, there is an increase in theta slowing of the background.  Vertex waves and symmetric sleep spindles were seen. Photic stimulation did not elicit any abnormalities.  There were no epileptiform discharges or electrographic seizures seen.    EKG lead was unremarkable.  Impression: This 1-hour awake and asleep EEG is normal.    Clinical Correlation: A normal EEG does not exclude a clinical diagnosis of epilepsy.  If further clinical questions remain, prolonged EEG may be helpful.  Clinical correlation is advised.   Patrcia Dolly, M.D.

## 2020-10-04 NOTE — Telephone Encounter (Signed)
She wants to do the MRI under sedation she said she takes nerve pills already and it didn't work

## 2020-10-05 NOTE — Telephone Encounter (Signed)
Called to try and schedule to had to leave a voice mail to call back

## 2020-10-06 NOTE — Telephone Encounter (Signed)
Pt mother called and given the number to central scheduling so they could schedule MRI with sedation

## 2020-10-07 DIAGNOSIS — F17210 Nicotine dependence, cigarettes, uncomplicated: Secondary | ICD-10-CM | POA: Diagnosis not present

## 2020-10-07 DIAGNOSIS — I10 Essential (primary) hypertension: Secondary | ICD-10-CM | POA: Diagnosis not present

## 2020-10-07 DIAGNOSIS — Z299 Encounter for prophylactic measures, unspecified: Secondary | ICD-10-CM | POA: Diagnosis not present

## 2020-10-07 DIAGNOSIS — M7061 Trochanteric bursitis, right hip: Secondary | ICD-10-CM | POA: Diagnosis not present

## 2020-10-12 ENCOUNTER — Other Ambulatory Visit: Payer: Self-pay

## 2020-10-12 ENCOUNTER — Telehealth (INDEPENDENT_AMBULATORY_CARE_PROVIDER_SITE_OTHER): Payer: Medicare Other | Admitting: Psychiatry

## 2020-10-12 ENCOUNTER — Encounter (HOSPITAL_COMMUNITY): Payer: Self-pay | Admitting: Psychiatry

## 2020-10-12 VITALS — Wt 229.0 lb

## 2020-10-12 DIAGNOSIS — F411 Generalized anxiety disorder: Secondary | ICD-10-CM | POA: Diagnosis not present

## 2020-10-12 DIAGNOSIS — F331 Major depressive disorder, recurrent, moderate: Secondary | ICD-10-CM | POA: Diagnosis not present

## 2020-10-12 MED ORDER — VENLAFAXINE HCL ER 150 MG PO CP24: 150.0000 mg | ORAL_CAPSULE | Freq: Every day | ORAL | 0 refills | Status: DC

## 2020-10-12 MED ORDER — BENZTROPINE MESYLATE 1 MG PO TABS
1.0000 mg | ORAL_TABLET | Freq: Every day | ORAL | 0 refills | Status: DC
Start: 2020-10-12 — End: 2021-01-12

## 2020-10-12 MED ORDER — LAMOTRIGINE 150 MG PO TABS: 150.0000 mg | ORAL_TABLET | Freq: Two times a day (BID) | ORAL | 0 refills | Status: DC

## 2020-10-12 MED ORDER — ARIPIPRAZOLE 5 MG PO TABS: 5.0000 mg | ORAL_TABLET | Freq: Every day | ORAL | 0 refills | Status: DC

## 2020-10-12 MED ORDER — LORAZEPAM 0.5 MG PO TABS: 0.5000 mg | ORAL_TABLET | Freq: Two times a day (BID) | ORAL | 2 refills | Status: DC

## 2020-10-12 MED ORDER — VENLAFAXINE HCL ER 37.5 MG PO CP24: 37.5000 mg | ORAL_CAPSULE | Freq: Every day | ORAL | 0 refills | Status: DC

## 2020-10-12 NOTE — Progress Notes (Signed)
Patient called an advised  
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
## 2020-10-18 DIAGNOSIS — H903 Sensorineural hearing loss, bilateral: Secondary | ICD-10-CM | POA: Diagnosis not present

## 2020-10-18 DIAGNOSIS — H7201 Central perforation of tympanic membrane, right ear: Secondary | ICD-10-CM | POA: Diagnosis not present

## 2020-10-18 DIAGNOSIS — H6121 Impacted cerumen, right ear: Secondary | ICD-10-CM | POA: Diagnosis not present

## 2020-10-18 DIAGNOSIS — H6983 Other specified disorders of Eustachian tube, bilateral: Secondary | ICD-10-CM | POA: Diagnosis not present

## 2020-10-20 DIAGNOSIS — I10 Essential (primary) hypertension: Secondary | ICD-10-CM | POA: Diagnosis not present

## 2020-10-20 DIAGNOSIS — E1165 Type 2 diabetes mellitus with hyperglycemia: Secondary | ICD-10-CM | POA: Diagnosis not present

## 2020-10-20 DIAGNOSIS — E7849 Other hyperlipidemia: Secondary | ICD-10-CM | POA: Diagnosis not present

## 2020-10-25 ENCOUNTER — Ambulatory Visit: Payer: Self-pay | Admitting: Neurology

## 2020-10-27 DIAGNOSIS — I10 Essential (primary) hypertension: Secondary | ICD-10-CM | POA: Diagnosis not present

## 2020-10-27 DIAGNOSIS — Z1211 Encounter for screening for malignant neoplasm of colon: Secondary | ICD-10-CM | POA: Diagnosis not present

## 2020-10-27 DIAGNOSIS — G4730 Sleep apnea, unspecified: Secondary | ICD-10-CM | POA: Diagnosis not present

## 2020-10-27 DIAGNOSIS — E7800 Pure hypercholesterolemia, unspecified: Secondary | ICD-10-CM | POA: Diagnosis not present

## 2020-10-27 DIAGNOSIS — F32A Depression, unspecified: Secondary | ICD-10-CM | POA: Diagnosis not present

## 2020-10-27 DIAGNOSIS — Q438 Other specified congenital malformations of intestine: Secondary | ICD-10-CM | POA: Diagnosis not present

## 2020-10-27 DIAGNOSIS — Z79899 Other long term (current) drug therapy: Secondary | ICD-10-CM | POA: Diagnosis not present

## 2020-11-01 DIAGNOSIS — Z299 Encounter for prophylactic measures, unspecified: Secondary | ICD-10-CM | POA: Diagnosis not present

## 2020-11-01 DIAGNOSIS — I10 Essential (primary) hypertension: Secondary | ICD-10-CM | POA: Diagnosis not present

## 2020-11-01 DIAGNOSIS — Z713 Dietary counseling and surveillance: Secondary | ICD-10-CM | POA: Diagnosis not present

## 2020-11-02 ENCOUNTER — Other Ambulatory Visit (HOSPITAL_COMMUNITY): Payer: Self-pay | Admitting: Psychiatry

## 2020-11-02 DIAGNOSIS — F331 Major depressive disorder, recurrent, moderate: Secondary | ICD-10-CM

## 2020-11-04 ENCOUNTER — Ambulatory Visit (HOSPITAL_COMMUNITY): Payer: Medicare Other

## 2020-11-14 ENCOUNTER — Encounter (HOSPITAL_COMMUNITY): Payer: Self-pay

## 2020-11-14 NOTE — Progress Notes (Signed)
EKG: DOS CXR: 10/10/15 ECHO: denies Stress Test: denies Cardiac Cath: denies  Fasting Blood Sugar- denies Checks Blood Sugar_0__ times a day  OSA: Yes CPAP: No  ASA/Blood Thinner: No  Covid test not needed  Anesthesia Review: No  Patient denies shortness of breath, fever, cough, and chest pain at PAT appointment.  Patient verbalized understanding of instructions provided today at the PAT appointment.  Patient asked to review instructions at home and day of surgery.

## 2020-11-15 ENCOUNTER — Ambulatory Visit (HOSPITAL_COMMUNITY)
Admission: RE | Admit: 2020-11-15 | Discharge: 2020-11-15 | Disposition: A | Discharge status: Home / Self Care | Payer: Medicare Other | Attending: Neurology | Admitting: Neurology

## 2020-11-15 ENCOUNTER — Encounter (HOSPITAL_COMMUNITY): Payer: Self-pay | Admitting: *Deleted

## 2020-11-15 ENCOUNTER — Ambulatory Visit (HOSPITAL_COMMUNITY): Payer: Medicare Other | Admitting: Anesthesiology

## 2020-11-15 ENCOUNTER — Encounter (HOSPITAL_COMMUNITY): Admission: RE | Disposition: A | Discharge status: Home / Self Care | Payer: Self-pay | Source: Home / Self Care

## 2020-11-15 ENCOUNTER — Other Ambulatory Visit: Payer: Self-pay

## 2020-11-15 ENCOUNTER — Ambulatory Visit (HOSPITAL_COMMUNITY)
Admission: RE | Admit: 2020-11-15 | Discharge: 2020-11-15 | Disposition: A | Discharge status: Home / Self Care | Payer: Medicare Other | Source: Ambulatory Visit | Attending: Neurology | Admitting: Neurology

## 2020-11-15 DIAGNOSIS — R519 Headache, unspecified: Secondary | ICD-10-CM | POA: Diagnosis not present

## 2020-11-15 DIAGNOSIS — G8929 Other chronic pain: Secondary | ICD-10-CM | POA: Diagnosis not present

## 2020-11-15 DIAGNOSIS — R404 Transient alteration of awareness: Secondary | ICD-10-CM | POA: Insufficient documentation

## 2020-11-15 DIAGNOSIS — R930 Abnormal findings on diagnostic imaging of skull and head, not elsewhere classified: Secondary | ICD-10-CM | POA: Insufficient documentation

## 2020-11-15 HISTORY — PX: RADIOLOGY WITH ANESTHESIA: SHX6223

## 2020-11-15 HISTORY — DX: Essential (primary) hypertension: I10

## 2020-11-15 LAB — POCT I-STAT, CHEM 8
BUN: 13 mg/dL (ref 6–20)
Calcium, Ion: 1.17 mmol/L (ref 1.15–1.40)
Chloride: 107 mmol/L (ref 98–111)
Creatinine, Ser: 0.7 mg/dL (ref 0.44–1.00)
Glucose, Bld: 104 mg/dL — ABNORMAL HIGH (ref 70–99)
HCT: 40 % (ref 36.0–46.0)
Hemoglobin: 13.6 g/dL (ref 12.0–15.0)
Potassium: 4.1 mmol/L (ref 3.5–5.1)
Sodium: 139 mmol/L (ref 135–145)
TCO2: 23 mmol/L (ref 22–32)

## 2020-11-15 SURGERY — MRI WITH ANESTHESIA
Anesthesia: General

## 2020-11-15 MED ORDER — FENTANYL CITRATE (PF) 100 MCG/2ML IJ SOLN
INTRAMUSCULAR | Status: DC | PRN
  Administered 2020-11-15: 100 ug via INTRAVENOUS

## 2020-11-15 MED ORDER — ROCURONIUM BROMIDE 10 MG/ML (PF) SYRINGE
PREFILLED_SYRINGE | INTRAVENOUS | Status: DC | PRN
  Administered 2020-11-15: 40 mg via INTRAVENOUS

## 2020-11-15 MED ORDER — SUCCINYLCHOLINE CHLORIDE 200 MG/10ML IV SOSY
PREFILLED_SYRINGE | INTRAVENOUS | Status: DC | PRN
  Administered 2020-11-15: 120 mg via INTRAVENOUS

## 2020-11-15 MED ORDER — PHENYLEPHRINE HCL-NACL 10-0.9 MG/250ML-% IV SOLN
INTRAVENOUS | Status: DC | PRN
  Administered 2020-11-15: 25 ug/min via INTRAVENOUS

## 2020-11-15 MED ORDER — SUGAMMADEX SODIUM 200 MG/2ML IV SOLN
INTRAVENOUS | Status: DC | PRN
  Administered 2020-11-15: 300 mg via INTRAVENOUS

## 2020-11-15 MED ORDER — MIDAZOLAM HCL 2 MG/2ML IJ SOLN
INTRAMUSCULAR | Status: AC
  Filled 2020-11-15: qty 2

## 2020-11-15 MED ORDER — ORAL CARE MOUTH RINSE: 15.0000 mL | Freq: Once | OROMUCOSAL | Status: DC

## 2020-11-15 MED ORDER — PROPOFOL 10 MG/ML IV BOLUS
INTRAVENOUS | Status: DC | PRN
  Administered 2020-11-15: 100 mg via INTRAVENOUS

## 2020-11-15 MED ORDER — ONDANSETRON HCL 4 MG/2ML IJ SOLN: 4.0000 mg | Freq: Once | INTRAMUSCULAR | Status: DC | PRN

## 2020-11-15 MED ORDER — MIDAZOLAM HCL 2 MG/2ML IJ SOLN
INTRAMUSCULAR | Status: DC | PRN
Start: 2020-11-15 — End: 2020-11-15
  Administered 2020-11-15: 2 mg via INTRAVENOUS

## 2020-11-15 MED ORDER — FENTANYL CITRATE (PF) 100 MCG/2ML IJ SOLN
INTRAMUSCULAR | Status: AC
  Filled 2020-11-15: qty 2

## 2020-11-15 MED ORDER — LACTATED RINGERS IV SOLN: INTRAVENOUS | Status: DC

## 2020-11-15 MED ORDER — ONDANSETRON HCL 4 MG/2ML IJ SOLN
INTRAMUSCULAR | Status: DC | PRN
  Administered 2020-11-15: 4 mg via INTRAVENOUS

## 2020-11-15 MED ORDER — CHLORHEXIDINE GLUCONATE 0.12 % MT SOLN: 15.0000 mL | Freq: Once | OROMUCOSAL | Status: DC

## 2020-11-15 MED ORDER — DEXAMETHASONE SODIUM PHOSPHATE 10 MG/ML IJ SOLN
INTRAMUSCULAR | Status: DC | PRN
  Administered 2020-11-15: 4 mg via INTRAVENOUS

## 2020-11-15 NOTE — Anesthesia Postprocedure Evaluation (Signed)
Anesthesia Post Note  Patient: Hailey Owen  Procedure(s) Performed: MRI WITH ANESTHESIA OF BRAIN WITHOUT CONTRAST     Patient location during evaluation: PACU Anesthesia Type: General Level of consciousness: awake and alert and oriented Pain management: pain level controlled Vital Signs Assessment: post-procedure vital signs reviewed and stable Respiratory status: spontaneous breathing, nonlabored ventilation and respiratory function stable Cardiovascular status: blood pressure returned to baseline and stable Postop Assessment: no apparent nausea or vomiting Anesthetic complications: no   No notable events documented.  Last Vitals:  Vitals:   11/15/20 1230 11/15/20 1245  BP: (!) 100/49 (!) 126/50  Pulse: 82 80  Resp: 15 18  Temp: (!) 36.1 C   SpO2: 96% 95%    Last Pain:  Vitals:   11/15/20 1230  TempSrc:   PainSc: 0-No pain                 Tracie Lindbloom A.

## 2020-11-15 NOTE — Anesthesia Preprocedure Evaluation (Addendum)
Anesthesia Evaluation  Patient identified by MRN, date of birth, ID band Patient awake    Reviewed: Allergy & Precautions, NPO status , Patient's Chart, lab work & pertinent test results, reviewed documented beta blocker date and time   Airway Mallampati: III  TM Distance: >3 FB Neck ROM: Full    Dental no notable dental hx. (+) Teeth Intact, Dental Advisory Given   Pulmonary sleep apnea and Continuous Positive Airway Pressure Ventilation , Current SmokerPatient did not abstain from smoking.,    Pulmonary exam normal breath sounds clear to auscultation       Cardiovascular hypertension, Pt. on medications Normal cardiovascular exam Rhythm:Regular Rate:Normal  EKG 11/15/20 NSR, Normal   Neuro/Psych  Headaches, Seizures -, Well Controlled,  PSYCHIATRIC DISORDERS Anxiety Depression Hx/o Panic attacksChronic daily HA    GI/Hepatic Neg liver ROS, GERD  ,  Endo/Other  Obesity Hyperlipidemia  Renal/GU negative Renal ROS  negative genitourinary   Musculoskeletal negative musculoskeletal ROS (+)   Abdominal (+) + obese,   Peds  Hematology negative hematology ROS (+)   Anesthesia Other Findings   Reproductive/Obstetrics                            Anesthesia Physical Anesthesia Plan  ASA: 3  Anesthesia Plan: General   Post-op Pain Management:    Induction: Intravenous  PONV Risk Score and Plan: 3 and Treatment may vary due to age or medical condition, Midazolam and Ondansetron  Airway Management Planned: Oral ETT and Video Laryngoscope Planned  Additional Equipment:   Intra-op Plan:   Post-operative Plan: Extubation in OR  Informed Consent: I have reviewed the patients History and Physical, chart, labs and discussed the procedure including the risks, benefits and alternatives for the proposed anesthesia with the patient or authorized representative who has indicated his/her understanding  and acceptance.     Dental advisory given  Plan Discussed with: CRNA and Anesthesiologist  Anesthesia Plan Comments:         Anesthesia Quick Evaluation

## 2020-11-15 NOTE — Transfer of Care (Signed)
Immediate Anesthesia Transfer of Care Note  Patient: Hailey Owen  Procedure(s) Performed: MRI WITH ANESTHESIA OF BRAIN WITHOUT CONTRAST  Patient Location: PACU  Anesthesia Type:General  Level of Consciousness: awake, drowsy and patient cooperative  Airway & Oxygen Therapy: Patient Spontanous Breathing and Patient connected to nasal cannula oxygen  Post-op Assessment: Report given to RN and Post -op Vital signs reviewed and stable  Post vital signs: Reviewed and stable  Last Vitals:  Vitals Value Taken Time  BP 100/49 11/15/20 1231  Temp 36.1 C 11/15/20 1230  Pulse 83 11/15/20 1235  Resp 28 11/15/20 1235  SpO2 91 % 11/15/20 1235  Vitals shown include unvalidated device data.  Last Pain:  Vitals:   11/15/20 1230  TempSrc:   PainSc: 0-No pain         Complications: No notable events documented.

## 2020-11-15 NOTE — Anesthesia Procedure Notes (Signed)
Procedure Name: Intubation Date/Time: 11/15/2020 11:23 AM Performed by: Carlos American, CRNA Pre-anesthesia Checklist: Patient identified, Emergency Drugs available, Suction available and Patient being monitored Patient Re-evaluated:Patient Re-evaluated prior to induction Oxygen Delivery Method: Circle System Utilized Preoxygenation: Pre-oxygenation with 100% oxygen Induction Type: IV induction Ventilation: Mask ventilation without difficulty Laryngoscope Size: Glidescope and 3 Grade View: Grade I Tube type: Oral Tube size: 7.0 mm Number of attempts: 1 Airway Equipment and Method: Stylet Placement Confirmation: ETT inserted through vocal cords under direct vision, positive ETCO2 and breath sounds checked- equal and bilateral Secured at: 21 cm Tube secured with: Tape Dental Injury: Teeth and Oropharynx as per pre-operative assessment

## 2020-11-16 ENCOUNTER — Encounter (HOSPITAL_COMMUNITY): Payer: Self-pay | Admitting: Radiology

## 2020-11-29 ENCOUNTER — Telehealth: Payer: Self-pay | Admitting: Neurology

## 2020-11-29 NOTE — Telephone Encounter (Signed)
Patient advised of MRI results, voiced understanding  

## 2020-11-29 NOTE — Telephone Encounter (Signed)
Pls let her know I reviewed brain MRI, no evidence of tumor, stroke, or bleed. It showed very small changes consistent with hardening of small blood vessels in the brain seen in patients with cholesterol, BP, or glucose control issues. Continue control of these conditions, thanks

## 2020-11-29 NOTE — Telephone Encounter (Signed)
Hailey Owen would like a call back regarding her MRI.

## 2020-12-02 DIAGNOSIS — Z299 Encounter for prophylactic measures, unspecified: Secondary | ICD-10-CM | POA: Diagnosis not present

## 2020-12-02 DIAGNOSIS — I10 Essential (primary) hypertension: Secondary | ICD-10-CM | POA: Diagnosis not present

## 2020-12-02 DIAGNOSIS — M7061 Trochanteric bursitis, right hip: Secondary | ICD-10-CM | POA: Diagnosis not present

## 2020-12-02 DIAGNOSIS — F17210 Nicotine dependence, cigarettes, uncomplicated: Secondary | ICD-10-CM | POA: Diagnosis not present

## 2020-12-30 DIAGNOSIS — F17210 Nicotine dependence, cigarettes, uncomplicated: Secondary | ICD-10-CM | POA: Diagnosis not present

## 2020-12-30 DIAGNOSIS — I10 Essential (primary) hypertension: Secondary | ICD-10-CM | POA: Diagnosis not present

## 2020-12-30 DIAGNOSIS — Z299 Encounter for prophylactic measures, unspecified: Secondary | ICD-10-CM | POA: Diagnosis not present

## 2020-12-30 DIAGNOSIS — M7061 Trochanteric bursitis, right hip: Secondary | ICD-10-CM | POA: Diagnosis not present

## 2021-01-08 ENCOUNTER — Other Ambulatory Visit (HOSPITAL_COMMUNITY): Payer: Self-pay | Admitting: Psychiatry

## 2021-01-08 DIAGNOSIS — F411 Generalized anxiety disorder: Secondary | ICD-10-CM

## 2021-01-12 ENCOUNTER — Encounter (HOSPITAL_COMMUNITY): Payer: Self-pay | Admitting: Psychiatry

## 2021-01-12 ENCOUNTER — Telehealth (INDEPENDENT_AMBULATORY_CARE_PROVIDER_SITE_OTHER): Payer: Medicare Other | Admitting: Psychiatry

## 2021-01-12 ENCOUNTER — Other Ambulatory Visit: Payer: Self-pay

## 2021-01-12 DIAGNOSIS — F331 Major depressive disorder, recurrent, moderate: Secondary | ICD-10-CM

## 2021-01-12 DIAGNOSIS — F411 Generalized anxiety disorder: Secondary | ICD-10-CM | POA: Diagnosis not present

## 2021-01-12 MED ORDER — LAMOTRIGINE 150 MG PO TABS: 150.0000 mg | ORAL_TABLET | Freq: Two times a day (BID) | ORAL | 0 refills | Status: DC

## 2021-01-12 MED ORDER — BENZTROPINE MESYLATE 1 MG PO TABS: 1.0000 mg | ORAL_TABLET | Freq: Every day | ORAL | 0 refills | Status: DC

## 2021-01-12 MED ORDER — VENLAFAXINE HCL ER 37.5 MG PO CP24: 37.5000 mg | ORAL_CAPSULE | Freq: Every day | ORAL | 0 refills | Status: DC

## 2021-01-12 MED ORDER — ARIPIPRAZOLE 5 MG PO TABS: 5.0000 mg | ORAL_TABLET | Freq: Every day | ORAL | 0 refills | Status: DC

## 2021-01-12 MED ORDER — VENLAFAXINE HCL ER 150 MG PO CP24: 150.0000 mg | ORAL_CAPSULE | Freq: Every day | ORAL | 0 refills | Status: DC

## 2021-01-12 MED ORDER — LORAZEPAM 0.5 MG PO TABS: 0.5000 mg | ORAL_TABLET | Freq: Two times a day (BID) | ORAL | 2 refills | Status: DC
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
## 2021-01-20 DIAGNOSIS — E7800 Pure hypercholesterolemia, unspecified: Secondary | ICD-10-CM | POA: Diagnosis not present

## 2021-01-20 DIAGNOSIS — I10 Essential (primary) hypertension: Secondary | ICD-10-CM | POA: Diagnosis not present

## 2021-03-13 ENCOUNTER — Encounter: Payer: Self-pay | Admitting: Neurology

## 2021-03-13 ENCOUNTER — Other Ambulatory Visit: Payer: Self-pay

## 2021-03-13 ENCOUNTER — Ambulatory Visit: Payer: Medicare Other | Admitting: Neurology

## 2021-03-13 VITALS — BP 107/70 | HR 98 | Ht 67.0 in | Wt 228.0 lb

## 2021-03-13 DIAGNOSIS — F445 Conversion disorder with seizures or convulsions: Secondary | ICD-10-CM

## 2021-03-13 DIAGNOSIS — R519 Headache, unspecified: Secondary | ICD-10-CM

## 2021-03-13 NOTE — Patient Instructions (Addendum)
Stop the Topamax. If no change in symptoms in 2 weeks, call our office and we will plan to start the Gabapentin 300mg  every night  2. Start weaning off the Tylenol until you are only taking 2-3 times a week  3. When you have bloodwork done, have them check thyroid and B12 levels  4. Follow-up in 6 months, call for any changes

## 2021-03-13 NOTE — Progress Notes (Signed)
NEUROLOGY FOLLOW UP OFFICE NOTE  Hailey Owen [id number] Feb 15, 1971  HISTORY OF PRESENT ILLNESS: I had the pleasure of seeing Hailey Owen in follow-up in the neurology clinic on 03/13/2021.  The patient was last seen 6 months ago for psychogenic non-epileptic events and headaches. She is again accompanied by her mother who helps supplement the history today.  Records and images were personally reviewed where available. Her 1-hour EEG in 09/2020 was normal. I personally reviewed MRI brain without contrast done 10/2020 which showed mild chronic microvascular disease, no acute changes. She was started on Topiramate for headache prophylaxis, taking 25mg  qhs. She denies any episodes of shaking or zoning out since her last visit. She continues to have daily headaches, taking 2 Tylenol TID on a daily basis. She also sometimes takes Tylenol PM to help for sleep and aches and pains. She is concerned Topiramate is causing her symptoms of hand twitching, stuttering, burning in her legs, and dry mouth. She states the leg burning is "not a 24 hour thing" but she wants to cry when they occur. When she goes to the gym, pain seems to improve. She denies any back pain but has hip and left knee pain. She denies any falls. She gets 8-10 hours of sleep. She continues to follow-up with Psychiatry and feels mood is good that she does not need therapy at this time.   History on Initial Assessment 09/20/2020: This is a 50 year old right-handed woman with a history of hyperlipidemia, anxiety, depression, psychogenic non-epileptic events, presenting for evaluation of zoning out and headaches. Records were reviewed. She had previously seen neurologist Dr. [id number] from 2013 to 2017, in 2013 she presented with stuttering speech, memory problems, tension in arms and legs, mild convulsions. She had a normal MRI and EEG, and in 2015 reported episodes of eyes rolling back, whole body stiffening, followed by confusion and fatigue. Video EEG  at [facility] in 2015 was normal, she had typical spells and and was diagnosed with non-epileptic events. On her follow-up in 2017, she reported another episode of unresponsiveness, snorting, blue around the mouth and was put on Keppra, which was discontinued as episode was felt due to syncope.   She presents today with her mother who helps provide additional information. She states zoning out started in 2015, her mother reports that the shaking spells quit and she would have them once in a blue moon, however last month "they came out of nowhere." They are not as intense as her spells in the past. She states the episodes start where she feels like she cannot breathe, she gets hot, then her body goes to shaking. She can see her body shaking, her hands and fee would jerk, sometimes she would lose awareness. A week ago, her mother saw that her hands and feet would straighten out. She usually sits/lays down and the episode ends in 5-10 minutes. Her mother puts a 7-10 on her face. She states she has bitten her tongue and had urinary incontinence several times. No falls. She describes symptoms of feeling swimmy-headed, sick to her stomach, stuttering, "cotton mouth," walking gets "fumbly." Her mother notes she gets up too quick and gets swimmy-headed. She states she is depressed today and could not fill out her paperwork right. She had been managing her own medications until 2 weeks ago when changes were made and she got tangled up taking them all at one time instead of BID. Her mother now helps with medications, she states lorazepam has been increased  to BID, Effexor dose increased, and Cogentin added. She states she has always had headaches, they are the same as they have always been, with pressure in the frontal and occipital regions. She would get nauseated and would gag for 3-4 rounds. She is sensitive to lights. She takes Tylenol which helps. She also takes Tylenol daily for right hip arthritis. She will be  seeing Ortho soon. She recalls taking Topiramate for migraines in the past which she felt helped, but she stopped due to fear of possible side effect that she read where it can cause blood clots. She takes melatonin which helps with sleep. She states she stopped seeing her therapist over a month ago because she was feeling well. They report her father had grand mal seizures and took 2 seizure medications. Her sister had a single seizure. Otherwise she had a normal birth and early development.  There is no history of febrile convulsions, CNS infections such as meningitis/encephalitis, significant traumatic brain injury, neurosurgical procedures.  Prior ASMs: lamotrigine (for mood), Keppra, Topamax (for migraines)   PAST MEDICAL HISTORY: Past Medical History:  Diagnosis Date   Anxiety    Depression    GERD (gastroesophageal reflux disease)    Headache(784.0)    Hernia    Hyperlipemia    Hypertension    Panic attack    Sleep apnea     MEDICATIONS: Current Outpatient Medications on File Prior to Visit  Medication Sig Dispense Refill   ARIPiprazole (ABILIFY) 5 MG tablet Take 1 tablet (5 mg total) by mouth daily. 90 tablet 0   benztropine (COGENTIN) 1 MG tablet Take 1 tablet (1 mg total) by mouth daily. 90 tablet 0   Biotin 1000 MCG tablet Take 1,000 mcg by mouth daily.     docusate sodium (STOOL SOFTENER) 100 MG capsule Take 100 mg by mouth 2 (two) times daily.     lamoTRIgine (LAMICTAL) 150 MG tablet Take 1 tablet (150 mg total) by mouth 2 (two) times daily. 180 tablet 0   lisinopril-hydrochlorothiazide (PRINZIDE,ZESTORETIC) 20-12.5 MG tablet Take 1 tablet by mouth daily.     LORazepam (ATIVAN) 0.5 MG tablet Take 1 tablet (0.5 mg total) by mouth 2 (two) times daily. 60 tablet 2   Melatonin 10 MG TABS Take 10 mg by mouth at bedtime.     Menthol, Topical Analgesic, (BIOFREEZE) 10 % LIQD Apply 1 application topically daily as needed (pain).     pantoprazole (PROTONIX) 40 MG tablet Take 40 mg  by mouth daily.     Probiotic Product (PROBIOTIC DAILY PO) Take 1 capsule by mouth daily.     rosuvastatin (CRESTOR) 20 MG tablet Take 20 mg by mouth daily.     topiramate (TOPAMAX) 25 MG tablet Take 1 tablet every night (Patient taking differently: Take 25 mg by mouth at bedtime. Take 1 tablet every night) 30 tablet 6   venlafaxine XR (EFFEXOR XR) 37.5 MG 24 hr capsule Take 1 capsule (37.5 mg total) by mouth daily. 90 capsule 0   venlafaxine XR (EFFEXOR-XR) 150 MG 24 hr capsule Take 1 capsule (150 mg total) by mouth daily. 90 capsule 0   No current facility-administered medications on file prior to visit.    ALLERGIES: Allergies  Allergen Reactions   Sulfa Antibiotics Other (See Comments)    REACTION: "Shut down immune system with nausea and vomiting, dehydration" was given for bladder infection, resulted in hospitalization   Septra [Sulfamethoxazole-Trimethoprim]     FAMILY HISTORY: Family History  Problem Relation Age of Onset  Alcohol abuse Father    Depression Father    Seizures Father    Depression Sister    Anxiety disorder Sister    Bipolar disorder Sister    Atrial fibrillation Mother     SOCIAL HISTORY: Social History   Socioeconomic History   Marital status: Divorced    Spouse name: Not on file   Number of children: 0   Years of education: College   Highest education level: Not on file  Occupational History   Occupation: Secondary school teacher    Employer: [employer]  Tobacco Use   Smoking status: Every Day    Packs/day: 1.00    Years: 30.00    Pack years: 30.00    Types: Cigarettes   Smokeless tobacco: Never  Vaping Use   Vaping Use: Former  Substance and Sexual Activity   Alcohol use: Not Currently   Drug use: No   Sexual activity: Not Currently    Birth control/protection: Surgical  Other Topics Concern   Not on file  Social History Narrative   Patient lives at home with mother.   Caffeine Use:1 cup of coffee daily; 3 sodas weekly   Right-handed    Social Determinants of Health   Financial Resource Strain: Not on file  Food Insecurity: Not on file  Transportation Needs: Not on file  Physical Activity: Not on file  Stress: Not on file  Social Connections: Not on file  Intimate Partner Violence: Not on file     PHYSICAL EXAM: Vitals:   03/13/21 1348  BP: 107/70  Pulse: 98  SpO2: 97%   General: No acute distress Head:  Normocephalic/atraumatic Skin/Extremities: No rash, no edema Neurological Exam: alert and awake. No aphasia or dysarthria. Fund of knowledge is appropriate.  Attention and concentration are normal.   Cranial nerves: Pupils equal, round. Extraocular movements intact with no nystagmus. Visual fields full.  No facial asymmetry.  Motor: Bulk and tone normal, muscle strength 5/5 throughout with no pronator drift.   Finger to nose testing intact.  Gait slow and cautious favoring left leg. No ataxia.   IMPRESSION: This is a 50 yo RH woman with a history of hyperlipidemia, anxiety, depression, psychogenic non-epileptic events, who presented for evaluation of zoning out and headaches. Zoning out episodes suggestive of psychogenic non-epileptic events (PNES), similar to symptoms captured at [facility] monitoring. She denies any episodes since last visit. Main concern today are daily headaches, there is likely a component of medication overuse headaches. She was advised to start weaning down Tylenol intake to 2-3 times a week. She is concerned Topiramate is causing several symptoms, although this is unlikely, we have agreed to stop Topiramate. If no improvement in symptoms, discussed that we can try gabapentin for the burning in her legs and headache prophylaxis as well. Side effects discussed, she will call our office for an update in 2 weeks and we can start gabapentin 300mg  qhs at that point. She has an upcoming follow-up with her PCP, would check glucose, TSH, and B12 levels as part of neuropathy workup. Follow-up in 6  months, they know to call for any changes.   Thank you for allowing me to participate in her care.  Please do not hesitate to call for any questions or concerns.    , M.D.   CC: Dr. Patrcia Dolly

## 2021-03-22 DIAGNOSIS — I10 Essential (primary) hypertension: Secondary | ICD-10-CM | POA: Diagnosis not present

## 2021-03-27 ENCOUNTER — Other Ambulatory Visit: Payer: Self-pay

## 2021-03-27 ENCOUNTER — Encounter (HOSPITAL_COMMUNITY): Payer: Self-pay | Admitting: Psychiatry

## 2021-03-27 ENCOUNTER — Telehealth (HOSPITAL_BASED_OUTPATIENT_CLINIC_OR_DEPARTMENT_OTHER): Payer: Medicare Other | Admitting: Psychiatry

## 2021-03-27 DIAGNOSIS — F331 Major depressive disorder, recurrent, moderate: Secondary | ICD-10-CM

## 2021-03-27 DIAGNOSIS — F411 Generalized anxiety disorder: Secondary | ICD-10-CM

## 2021-03-27 MED ORDER — LORAZEPAM 0.5 MG PO TABS: 0.5000 mg | ORAL_TABLET | Freq: Two times a day (BID) | ORAL | 2 refills | Status: DC

## 2021-03-27 MED ORDER — BENZTROPINE MESYLATE 0.5 MG PO TABS: 0.5000 mg | ORAL_TABLET | Freq: Every day | ORAL | 0 refills | Status: DC

## 2021-03-27 MED ORDER — VENLAFAXINE HCL ER 150 MG PO CP24: 150.0000 mg | ORAL_CAPSULE | Freq: Every day | ORAL | 0 refills | Status: DC

## 2021-03-27 MED ORDER — ARIPIPRAZOLE 5 MG PO TABS: 5.0000 mg | ORAL_TABLET | Freq: Every day | ORAL | 0 refills | Status: DC

## 2021-03-27 MED ORDER — VENLAFAXINE HCL ER 37.5 MG PO CP24: 37.5000 mg | ORAL_CAPSULE | Freq: Every day | ORAL | 0 refills | Status: DC

## 2021-03-27 MED ORDER — LAMOTRIGINE 150 MG PO TABS
150.0000 mg | ORAL_TABLET | Freq: Two times a day (BID) | ORAL | 0 refills | Status: DC
Start: 2021-03-27 — End: 2021-07-12
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
## 2021-03-29 DIAGNOSIS — R200 Anesthesia of skin: Secondary | ICD-10-CM | POA: Diagnosis not present

## 2021-03-29 DIAGNOSIS — R739 Hyperglycemia, unspecified: Secondary | ICD-10-CM | POA: Diagnosis not present

## 2021-03-29 DIAGNOSIS — I10 Essential (primary) hypertension: Secondary | ICD-10-CM | POA: Diagnosis not present

## 2021-03-29 DIAGNOSIS — Z299 Encounter for prophylactic measures, unspecified: Secondary | ICD-10-CM | POA: Diagnosis not present

## 2021-03-29 DIAGNOSIS — R202 Paresthesia of skin: Secondary | ICD-10-CM | POA: Diagnosis not present

## 2021-03-30 DIAGNOSIS — R200 Anesthesia of skin: Secondary | ICD-10-CM | POA: Diagnosis not present

## 2021-03-30 DIAGNOSIS — I10 Essential (primary) hypertension: Secondary | ICD-10-CM | POA: Diagnosis not present

## 2021-03-30 DIAGNOSIS — R739 Hyperglycemia, unspecified: Secondary | ICD-10-CM | POA: Diagnosis not present

## 2021-04-02 DIAGNOSIS — M47812 Spondylosis without myelopathy or radiculopathy, cervical region: Secondary | ICD-10-CM | POA: Diagnosis not present

## 2021-04-02 DIAGNOSIS — K760 Fatty (change of) liver, not elsewhere classified: Secondary | ICD-10-CM | POA: Diagnosis not present

## 2021-04-02 DIAGNOSIS — M7989 Other specified soft tissue disorders: Secondary | ICD-10-CM | POA: Diagnosis not present

## 2021-04-02 DIAGNOSIS — E7800 Pure hypercholesterolemia, unspecified: Secondary | ICD-10-CM | POA: Diagnosis not present

## 2021-04-02 DIAGNOSIS — W1839XA Other fall on same level, initial encounter: Secondary | ICD-10-CM | POA: Diagnosis not present

## 2021-04-02 DIAGNOSIS — S42344A Nondisplaced spiral fracture of shaft of humerus, right arm, initial encounter for closed fracture: Secondary | ICD-10-CM | POA: Diagnosis not present

## 2021-04-02 DIAGNOSIS — I700 Atherosclerosis of aorta: Secondary | ICD-10-CM | POA: Diagnosis not present

## 2021-04-02 DIAGNOSIS — Z043 Encounter for examination and observation following other accident: Secondary | ICD-10-CM | POA: Diagnosis not present

## 2021-04-02 DIAGNOSIS — S3991XA Unspecified injury of abdomen, initial encounter: Secondary | ICD-10-CM | POA: Diagnosis not present

## 2021-04-02 DIAGNOSIS — I2510 Atherosclerotic heart disease of native coronary artery without angina pectoris: Secondary | ICD-10-CM | POA: Diagnosis not present

## 2021-04-02 DIAGNOSIS — I10 Essential (primary) hypertension: Secondary | ICD-10-CM | POA: Diagnosis not present

## 2021-04-02 DIAGNOSIS — G5631 Lesion of radial nerve, right upper limb: Secondary | ICD-10-CM | POA: Diagnosis not present

## 2021-04-02 DIAGNOSIS — S299XXA Unspecified injury of thorax, initial encounter: Secondary | ICD-10-CM | POA: Diagnosis not present

## 2021-04-02 DIAGNOSIS — S0990XA Unspecified injury of head, initial encounter: Secondary | ICD-10-CM | POA: Diagnosis not present

## 2021-04-03 DIAGNOSIS — S42361A Displaced segmental fracture of shaft of humerus, right arm, initial encounter for closed fracture: Secondary | ICD-10-CM | POA: Diagnosis not present

## 2021-04-05 DIAGNOSIS — S42361A Displaced segmental fracture of shaft of humerus, right arm, initial encounter for closed fracture: Secondary | ICD-10-CM | POA: Diagnosis not present

## 2021-04-05 DIAGNOSIS — M79644 Pain in right finger(s): Secondary | ICD-10-CM | POA: Diagnosis not present

## 2021-04-10 DIAGNOSIS — S42361A Displaced segmental fracture of shaft of humerus, right arm, initial encounter for closed fracture: Secondary | ICD-10-CM | POA: Diagnosis not present

## 2021-04-11 DIAGNOSIS — S42361A Displaced segmental fracture of shaft of humerus, right arm, initial encounter for closed fracture: Secondary | ICD-10-CM | POA: Diagnosis not present

## 2021-04-12 DIAGNOSIS — S42361A Displaced segmental fracture of shaft of humerus, right arm, initial encounter for closed fracture: Secondary | ICD-10-CM | POA: Diagnosis not present

## 2021-04-26 DIAGNOSIS — S42361A Displaced segmental fracture of shaft of humerus, right arm, initial encounter for closed fracture: Secondary | ICD-10-CM | POA: Diagnosis not present

## 2021-05-03 DIAGNOSIS — S42361S Displaced segmental fracture of shaft of humerus, right arm, sequela: Secondary | ICD-10-CM | POA: Diagnosis not present

## 2021-05-17 DIAGNOSIS — S42361S Displaced segmental fracture of shaft of humerus, right arm, sequela: Secondary | ICD-10-CM | POA: Diagnosis not present

## 2021-05-24 DIAGNOSIS — S42361B Displaced segmental fracture of shaft of humerus, right arm, initial encounter for open fracture: Secondary | ICD-10-CM | POA: Diagnosis not present

## 2021-05-24 DIAGNOSIS — S42361S Displaced segmental fracture of shaft of humerus, right arm, sequela: Secondary | ICD-10-CM | POA: Diagnosis not present

## 2021-05-26 DIAGNOSIS — S42361S Displaced segmental fracture of shaft of humerus, right arm, sequela: Secondary | ICD-10-CM | POA: Diagnosis not present

## 2021-05-26 DIAGNOSIS — S42361B Displaced segmental fracture of shaft of humerus, right arm, initial encounter for open fracture: Secondary | ICD-10-CM | POA: Diagnosis not present

## 2021-05-29 DIAGNOSIS — S42361S Displaced segmental fracture of shaft of humerus, right arm, sequela: Secondary | ICD-10-CM | POA: Diagnosis not present

## 2021-05-29 DIAGNOSIS — S42361B Displaced segmental fracture of shaft of humerus, right arm, initial encounter for open fracture: Secondary | ICD-10-CM | POA: Diagnosis not present

## 2021-05-31 DIAGNOSIS — S42361B Displaced segmental fracture of shaft of humerus, right arm, initial encounter for open fracture: Secondary | ICD-10-CM | POA: Diagnosis not present

## 2021-05-31 DIAGNOSIS — S42361S Displaced segmental fracture of shaft of humerus, right arm, sequela: Secondary | ICD-10-CM | POA: Diagnosis not present

## 2021-06-05 DIAGNOSIS — S42361S Displaced segmental fracture of shaft of humerus, right arm, sequela: Secondary | ICD-10-CM | POA: Diagnosis not present

## 2021-06-05 DIAGNOSIS — S42361B Displaced segmental fracture of shaft of humerus, right arm, initial encounter for open fracture: Secondary | ICD-10-CM | POA: Diagnosis not present

## 2021-06-07 DIAGNOSIS — S42361B Displaced segmental fracture of shaft of humerus, right arm, initial encounter for open fracture: Secondary | ICD-10-CM | POA: Diagnosis not present

## 2021-06-07 DIAGNOSIS — S42361S Displaced segmental fracture of shaft of humerus, right arm, sequela: Secondary | ICD-10-CM | POA: Diagnosis not present

## 2021-06-08 DIAGNOSIS — S42361D Displaced segmental fracture of shaft of humerus, right arm, subsequent encounter for fracture with routine healing: Secondary | ICD-10-CM | POA: Diagnosis not present

## 2021-06-12 DIAGNOSIS — E7800 Pure hypercholesterolemia, unspecified: Secondary | ICD-10-CM | POA: Diagnosis not present

## 2021-06-12 DIAGNOSIS — I10 Essential (primary) hypertension: Secondary | ICD-10-CM | POA: Diagnosis not present

## 2021-06-12 DIAGNOSIS — R059 Cough, unspecified: Secondary | ICD-10-CM | POA: Diagnosis not present

## 2021-06-12 DIAGNOSIS — J40 Bronchitis, not specified as acute or chronic: Secondary | ICD-10-CM | POA: Diagnosis not present

## 2021-06-12 DIAGNOSIS — E876 Hypokalemia: Secondary | ICD-10-CM | POA: Diagnosis not present

## 2021-06-12 DIAGNOSIS — R9431 Abnormal electrocardiogram [ECG] [EKG]: Secondary | ICD-10-CM | POA: Diagnosis not present

## 2021-06-12 DIAGNOSIS — F17210 Nicotine dependence, cigarettes, uncomplicated: Secondary | ICD-10-CM | POA: Diagnosis not present

## 2021-06-12 DIAGNOSIS — Z79899 Other long term (current) drug therapy: Secondary | ICD-10-CM | POA: Diagnosis not present

## 2021-06-12 DIAGNOSIS — J441 Chronic obstructive pulmonary disease with (acute) exacerbation: Secondary | ICD-10-CM | POA: Diagnosis not present

## 2021-06-12 DIAGNOSIS — Z20822 Contact with and (suspected) exposure to covid-19: Secondary | ICD-10-CM | POA: Diagnosis not present

## 2021-06-12 DIAGNOSIS — R0602 Shortness of breath: Secondary | ICD-10-CM | POA: Diagnosis not present

## 2021-06-12 DIAGNOSIS — J069 Acute upper respiratory infection, unspecified: Secondary | ICD-10-CM | POA: Diagnosis not present

## 2021-06-13 DIAGNOSIS — Z299 Encounter for prophylactic measures, unspecified: Secondary | ICD-10-CM | POA: Diagnosis not present

## 2021-06-13 DIAGNOSIS — I10 Essential (primary) hypertension: Secondary | ICD-10-CM | POA: Diagnosis not present

## 2021-06-13 DIAGNOSIS — R569 Unspecified convulsions: Secondary | ICD-10-CM | POA: Diagnosis not present

## 2021-06-13 DIAGNOSIS — J069 Acute upper respiratory infection, unspecified: Secondary | ICD-10-CM | POA: Diagnosis not present

## 2021-06-20 DIAGNOSIS — I10 Essential (primary) hypertension: Secondary | ICD-10-CM | POA: Diagnosis not present

## 2021-06-20 DIAGNOSIS — E7800 Pure hypercholesterolemia, unspecified: Secondary | ICD-10-CM | POA: Diagnosis not present

## 2021-06-21 DIAGNOSIS — J329 Chronic sinusitis, unspecified: Secondary | ICD-10-CM | POA: Diagnosis not present

## 2021-06-21 DIAGNOSIS — B9689 Other specified bacterial agents as the cause of diseases classified elsewhere: Secondary | ICD-10-CM | POA: Diagnosis not present

## 2021-06-21 DIAGNOSIS — F17210 Nicotine dependence, cigarettes, uncomplicated: Secondary | ICD-10-CM | POA: Diagnosis not present

## 2021-06-21 DIAGNOSIS — I10 Essential (primary) hypertension: Secondary | ICD-10-CM | POA: Diagnosis not present

## 2021-06-21 DIAGNOSIS — S42361B Displaced segmental fracture of shaft of humerus, right arm, initial encounter for open fracture: Secondary | ICD-10-CM | POA: Diagnosis not present

## 2021-06-21 DIAGNOSIS — Z299 Encounter for prophylactic measures, unspecified: Secondary | ICD-10-CM | POA: Diagnosis not present

## 2021-06-21 DIAGNOSIS — S42361S Displaced segmental fracture of shaft of humerus, right arm, sequela: Secondary | ICD-10-CM | POA: Diagnosis not present

## 2021-06-27 DIAGNOSIS — S42361S Displaced segmental fracture of shaft of humerus, right arm, sequela: Secondary | ICD-10-CM | POA: Diagnosis not present

## 2021-06-27 DIAGNOSIS — S42361B Displaced segmental fracture of shaft of humerus, right arm, initial encounter for open fracture: Secondary | ICD-10-CM | POA: Diagnosis not present

## 2021-06-29 DIAGNOSIS — S42361B Displaced segmental fracture of shaft of humerus, right arm, initial encounter for open fracture: Secondary | ICD-10-CM | POA: Diagnosis not present

## 2021-06-29 DIAGNOSIS — S42361S Displaced segmental fracture of shaft of humerus, right arm, sequela: Secondary | ICD-10-CM | POA: Diagnosis not present

## 2021-07-03 ENCOUNTER — Other Ambulatory Visit: Payer: Self-pay

## 2021-07-03 ENCOUNTER — Telehealth (HOSPITAL_COMMUNITY): Payer: Medicare Other | Admitting: Psychiatry

## 2021-07-04 DIAGNOSIS — S42361S Displaced segmental fracture of shaft of humerus, right arm, sequela: Secondary | ICD-10-CM | POA: Diagnosis not present

## 2021-07-04 DIAGNOSIS — S42361B Displaced segmental fracture of shaft of humerus, right arm, initial encounter for open fracture: Secondary | ICD-10-CM | POA: Diagnosis not present

## 2021-07-12 ENCOUNTER — Other Ambulatory Visit: Payer: Self-pay

## 2021-07-12 ENCOUNTER — Encounter (HOSPITAL_COMMUNITY): Payer: Self-pay | Admitting: Psychiatry

## 2021-07-12 ENCOUNTER — Telehealth (HOSPITAL_BASED_OUTPATIENT_CLINIC_OR_DEPARTMENT_OTHER): Payer: Medicare Other | Admitting: Psychiatry

## 2021-07-12 DIAGNOSIS — F331 Major depressive disorder, recurrent, moderate: Secondary | ICD-10-CM | POA: Diagnosis not present

## 2021-07-12 DIAGNOSIS — F411 Generalized anxiety disorder: Secondary | ICD-10-CM | POA: Diagnosis not present

## 2021-07-12 MED ORDER — BENZTROPINE MESYLATE 0.5 MG PO TABS: 0.5000 mg | ORAL_TABLET | Freq: Every day | ORAL | 0 refills | Status: DC

## 2021-07-12 MED ORDER — LORAZEPAM 0.5 MG PO TABS: 0.5000 mg | ORAL_TABLET | Freq: Two times a day (BID) | ORAL | 2 refills | Status: DC

## 2021-07-12 MED ORDER — VENLAFAXINE HCL ER 150 MG PO CP24: 150.0000 mg | ORAL_CAPSULE | Freq: Every day | ORAL | 0 refills | Status: DC

## 2021-07-12 MED ORDER — ARIPIPRAZOLE 5 MG PO TABS: 5.0000 mg | ORAL_TABLET | Freq: Every day | ORAL | 0 refills | Status: DC

## 2021-07-12 MED ORDER — LAMOTRIGINE 150 MG PO TABS: 150.0000 mg | ORAL_TABLET | Freq: Two times a day (BID) | ORAL | 0 refills | Status: DC

## 2021-07-12 MED ORDER — VENLAFAXINE HCL ER 37.5 MG PO CP24: 37.5000 mg | ORAL_CAPSULE | Freq: Every day | ORAL | 0 refills | Status: DC
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
## 2021-07-16 DIAGNOSIS — L03213 Periorbital cellulitis: Secondary | ICD-10-CM | POA: Diagnosis not present

## 2021-07-27 DIAGNOSIS — S42321K Displaced transverse fracture of shaft of humerus, right arm, subsequent encounter for fracture with nonunion: Secondary | ICD-10-CM | POA: Diagnosis not present

## 2021-07-27 DIAGNOSIS — S42361D Displaced segmental fracture of shaft of humerus, right arm, subsequent encounter for fracture with routine healing: Secondary | ICD-10-CM | POA: Diagnosis not present

## 2021-08-02 DIAGNOSIS — Z299 Encounter for prophylactic measures, unspecified: Secondary | ICD-10-CM | POA: Diagnosis not present

## 2021-08-02 DIAGNOSIS — Z87891 Personal history of nicotine dependence: Secondary | ICD-10-CM | POA: Diagnosis not present

## 2021-08-02 DIAGNOSIS — R5383 Other fatigue: Secondary | ICD-10-CM | POA: Diagnosis not present

## 2021-08-02 DIAGNOSIS — Z0000 Encounter for general adult medical examination without abnormal findings: Secondary | ICD-10-CM | POA: Diagnosis not present

## 2021-08-02 DIAGNOSIS — E7800 Pure hypercholesterolemia, unspecified: Secondary | ICD-10-CM | POA: Diagnosis not present

## 2021-08-02 DIAGNOSIS — Z7189 Other specified counseling: Secondary | ICD-10-CM | POA: Diagnosis not present

## 2021-08-02 DIAGNOSIS — I10 Essential (primary) hypertension: Secondary | ICD-10-CM | POA: Diagnosis not present

## 2021-08-02 DIAGNOSIS — Z79899 Other long term (current) drug therapy: Secondary | ICD-10-CM | POA: Diagnosis not present

## 2021-08-09 DIAGNOSIS — I10 Essential (primary) hypertension: Secondary | ICD-10-CM | POA: Diagnosis not present

## 2021-08-09 DIAGNOSIS — K760 Fatty (change of) liver, not elsewhere classified: Secondary | ICD-10-CM | POA: Diagnosis not present

## 2021-08-09 DIAGNOSIS — R739 Hyperglycemia, unspecified: Secondary | ICD-10-CM | POA: Diagnosis not present

## 2021-08-09 DIAGNOSIS — F17210 Nicotine dependence, cigarettes, uncomplicated: Secondary | ICD-10-CM | POA: Diagnosis not present

## 2021-08-09 DIAGNOSIS — R7989 Other specified abnormal findings of blood chemistry: Secondary | ICD-10-CM | POA: Diagnosis not present

## 2021-08-09 DIAGNOSIS — Z299 Encounter for prophylactic measures, unspecified: Secondary | ICD-10-CM | POA: Diagnosis not present

## 2021-08-15 DIAGNOSIS — F17210 Nicotine dependence, cigarettes, uncomplicated: Secondary | ICD-10-CM | POA: Diagnosis not present

## 2021-08-15 DIAGNOSIS — M1990 Unspecified osteoarthritis, unspecified site: Secondary | ICD-10-CM | POA: Diagnosis not present

## 2021-08-15 DIAGNOSIS — I10 Essential (primary) hypertension: Secondary | ICD-10-CM | POA: Diagnosis not present

## 2021-08-15 DIAGNOSIS — Z299 Encounter for prophylactic measures, unspecified: Secondary | ICD-10-CM | POA: Diagnosis not present

## 2021-08-24 DIAGNOSIS — S42361D Displaced segmental fracture of shaft of humerus, right arm, subsequent encounter for fracture with routine healing: Secondary | ICD-10-CM | POA: Diagnosis not present

## 2021-08-28 DIAGNOSIS — H40033 Anatomical narrow angle, bilateral: Secondary | ICD-10-CM | POA: Diagnosis not present

## 2021-08-28 DIAGNOSIS — H04123 Dry eye syndrome of bilateral lacrimal glands: Secondary | ICD-10-CM | POA: Diagnosis not present

## 2021-10-12 ENCOUNTER — Encounter (HOSPITAL_COMMUNITY): Payer: Self-pay | Admitting: Psychiatry

## 2021-10-12 ENCOUNTER — Telehealth (HOSPITAL_BASED_OUTPATIENT_CLINIC_OR_DEPARTMENT_OTHER): Payer: Medicare Other | Admitting: Psychiatry

## 2021-10-12 DIAGNOSIS — F411 Generalized anxiety disorder: Secondary | ICD-10-CM | POA: Diagnosis not present

## 2021-10-12 DIAGNOSIS — F331 Major depressive disorder, recurrent, moderate: Secondary | ICD-10-CM

## 2021-10-12 MED ORDER — LAMOTRIGINE 150 MG PO TABS: 150.0000 mg | ORAL_TABLET | Freq: Two times a day (BID) | ORAL | 0 refills | Status: DC

## 2021-10-12 MED ORDER — VENLAFAXINE HCL ER 37.5 MG PO CP24: 37.5000 mg | ORAL_CAPSULE | Freq: Every day | ORAL | 0 refills | Status: DC

## 2021-10-12 MED ORDER — LORAZEPAM 0.5 MG PO TABS: 0.5000 mg | ORAL_TABLET | Freq: Two times a day (BID) | ORAL | 2 refills | Status: DC

## 2021-10-12 MED ORDER — VENLAFAXINE HCL ER 150 MG PO CP24: 150.0000 mg | ORAL_CAPSULE | Freq: Every day | ORAL | 0 refills | Status: DC

## 2021-10-12 MED ORDER — ARIPIPRAZOLE 5 MG PO TABS: 5.0000 mg | ORAL_TABLET | Freq: Every day | ORAL | 0 refills | Status: DC

## 2021-10-12 MED ORDER — BENZTROPINE MESYLATE 0.5 MG PO TABS: 0.5000 mg | ORAL_TABLET | Freq: Every day | ORAL | 0 refills | Status: DC
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
## 2021-10-27 ENCOUNTER — Ambulatory Visit: Payer: Self-pay | Admitting: Neurology

## 2021-10-27 ENCOUNTER — Encounter: Payer: Self-pay | Admitting: Neurology

## 2021-10-27 DIAGNOSIS — Z029 Encounter for administrative examinations, unspecified: Secondary | ICD-10-CM

## 2021-11-08 DIAGNOSIS — F17210 Nicotine dependence, cigarettes, uncomplicated: Secondary | ICD-10-CM | POA: Diagnosis not present

## 2021-11-08 DIAGNOSIS — L02415 Cutaneous abscess of right lower limb: Secondary | ICD-10-CM | POA: Diagnosis not present

## 2021-11-08 DIAGNOSIS — E7800 Pure hypercholesterolemia, unspecified: Secondary | ICD-10-CM | POA: Diagnosis not present

## 2021-11-08 DIAGNOSIS — I10 Essential (primary) hypertension: Secondary | ICD-10-CM | POA: Diagnosis not present

## 2021-11-08 DIAGNOSIS — G4730 Sleep apnea, unspecified: Secondary | ICD-10-CM | POA: Diagnosis not present

## 2021-11-10 DIAGNOSIS — Z79899 Other long term (current) drug therapy: Secondary | ICD-10-CM | POA: Diagnosis not present

## 2021-11-10 DIAGNOSIS — Z791 Long term (current) use of non-steroidal anti-inflammatories (NSAID): Secondary | ICD-10-CM | POA: Diagnosis not present

## 2021-11-10 DIAGNOSIS — I10 Essential (primary) hypertension: Secondary | ICD-10-CM | POA: Diagnosis not present

## 2021-11-10 DIAGNOSIS — E7800 Pure hypercholesterolemia, unspecified: Secondary | ICD-10-CM | POA: Diagnosis not present

## 2021-11-10 DIAGNOSIS — L02416 Cutaneous abscess of left lower limb: Secondary | ICD-10-CM | POA: Diagnosis not present

## 2021-11-10 DIAGNOSIS — F32A Depression, unspecified: Secondary | ICD-10-CM | POA: Diagnosis not present

## 2021-11-10 DIAGNOSIS — Z882 Allergy status to sulfonamides status: Secondary | ICD-10-CM | POA: Diagnosis not present

## 2021-11-10 DIAGNOSIS — N6001 Solitary cyst of right breast: Secondary | ICD-10-CM | POA: Diagnosis not present

## 2021-11-10 DIAGNOSIS — L02415 Cutaneous abscess of right lower limb: Secondary | ICD-10-CM | POA: Diagnosis not present

## 2021-11-10 DIAGNOSIS — R569 Unspecified convulsions: Secondary | ICD-10-CM | POA: Diagnosis not present

## 2021-11-10 DIAGNOSIS — F17210 Nicotine dependence, cigarettes, uncomplicated: Secondary | ICD-10-CM | POA: Diagnosis not present

## 2021-11-10 DIAGNOSIS — R062 Wheezing: Secondary | ICD-10-CM | POA: Diagnosis not present

## 2021-12-06 DIAGNOSIS — I10 Essential (primary) hypertension: Secondary | ICD-10-CM | POA: Diagnosis not present

## 2021-12-06 DIAGNOSIS — Z299 Encounter for prophylactic measures, unspecified: Secondary | ICD-10-CM | POA: Diagnosis not present

## 2021-12-06 DIAGNOSIS — F17210 Nicotine dependence, cigarettes, uncomplicated: Secondary | ICD-10-CM | POA: Diagnosis not present

## 2021-12-06 DIAGNOSIS — Z0000 Encounter for general adult medical examination without abnormal findings: Secondary | ICD-10-CM | POA: Diagnosis not present

## 2021-12-07 DIAGNOSIS — R7989 Other specified abnormal findings of blood chemistry: Secondary | ICD-10-CM | POA: Diagnosis not present

## 2021-12-08 DIAGNOSIS — Z882 Allergy status to sulfonamides status: Secondary | ICD-10-CM | POA: Diagnosis not present

## 2021-12-08 DIAGNOSIS — F32A Depression, unspecified: Secondary | ICD-10-CM | POA: Diagnosis not present

## 2021-12-08 DIAGNOSIS — L02415 Cutaneous abscess of right lower limb: Secondary | ICD-10-CM | POA: Diagnosis not present

## 2021-12-08 DIAGNOSIS — F17210 Nicotine dependence, cigarettes, uncomplicated: Secondary | ICD-10-CM | POA: Diagnosis not present

## 2021-12-08 DIAGNOSIS — Z791 Long term (current) use of non-steroidal anti-inflammatories (NSAID): Secondary | ICD-10-CM | POA: Diagnosis not present

## 2021-12-08 DIAGNOSIS — L02416 Cutaneous abscess of left lower limb: Secondary | ICD-10-CM | POA: Diagnosis not present

## 2021-12-08 DIAGNOSIS — Z79899 Other long term (current) drug therapy: Secondary | ICD-10-CM | POA: Diagnosis not present

## 2021-12-08 DIAGNOSIS — R062 Wheezing: Secondary | ICD-10-CM | POA: Diagnosis not present

## 2021-12-08 DIAGNOSIS — I10 Essential (primary) hypertension: Secondary | ICD-10-CM | POA: Diagnosis not present

## 2021-12-08 DIAGNOSIS — E7800 Pure hypercholesterolemia, unspecified: Secondary | ICD-10-CM | POA: Diagnosis not present

## 2021-12-08 DIAGNOSIS — R569 Unspecified convulsions: Secondary | ICD-10-CM | POA: Diagnosis not present

## 2021-12-11 DIAGNOSIS — N6001 Solitary cyst of right breast: Secondary | ICD-10-CM | POA: Diagnosis not present

## 2022-01-02 ENCOUNTER — Telehealth (HOSPITAL_BASED_OUTPATIENT_CLINIC_OR_DEPARTMENT_OTHER): Payer: Medicare Other | Admitting: Psychiatry

## 2022-01-02 ENCOUNTER — Encounter (HOSPITAL_COMMUNITY): Payer: Self-pay | Admitting: Psychiatry

## 2022-01-02 DIAGNOSIS — F331 Major depressive disorder, recurrent, moderate: Secondary | ICD-10-CM | POA: Diagnosis not present

## 2022-01-02 DIAGNOSIS — F411 Generalized anxiety disorder: Secondary | ICD-10-CM | POA: Diagnosis not present

## 2022-01-02 MED ORDER — VENLAFAXINE HCL ER 37.5 MG PO CP24: 37.5000 mg | ORAL_CAPSULE | Freq: Every day | ORAL | 0 refills | Status: DC

## 2022-01-02 MED ORDER — BENZTROPINE MESYLATE 0.5 MG PO TABS: 0.5000 mg | ORAL_TABLET | Freq: Every day | ORAL | 0 refills | Status: DC

## 2022-01-02 MED ORDER — LORAZEPAM 0.5 MG PO TABS: 0.5000 mg | ORAL_TABLET | Freq: Two times a day (BID) | ORAL | 2 refills | Status: DC

## 2022-01-02 MED ORDER — ARIPIPRAZOLE 5 MG PO TABS: 5.0000 mg | ORAL_TABLET | Freq: Every day | ORAL | 0 refills | Status: DC

## 2022-01-02 MED ORDER — LAMOTRIGINE 150 MG PO TABS: 150.0000 mg | ORAL_TABLET | Freq: Two times a day (BID) | ORAL | 0 refills | Status: DC

## 2022-01-02 MED ORDER — VENLAFAXINE HCL ER 150 MG PO CP24: 150.0000 mg | ORAL_CAPSULE | Freq: Every day | ORAL | 0 refills | Status: DC
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
## 2022-01-05 ENCOUNTER — Telehealth (HOSPITAL_COMMUNITY): Payer: Self-pay | Admitting: *Deleted

## 2022-01-05 DIAGNOSIS — G4730 Sleep apnea, unspecified: Secondary | ICD-10-CM | POA: Diagnosis not present

## 2022-01-05 DIAGNOSIS — R110 Nausea: Secondary | ICD-10-CM | POA: Diagnosis not present

## 2022-01-05 DIAGNOSIS — E7800 Pure hypercholesterolemia, unspecified: Secondary | ICD-10-CM | POA: Diagnosis not present

## 2022-01-05 DIAGNOSIS — I10 Essential (primary) hypertension: Secondary | ICD-10-CM | POA: Diagnosis not present

## 2022-01-05 DIAGNOSIS — L02415 Cutaneous abscess of right lower limb: Secondary | ICD-10-CM | POA: Diagnosis not present

## 2022-01-05 DIAGNOSIS — Z79899 Other long term (current) drug therapy: Secondary | ICD-10-CM | POA: Diagnosis not present

## 2022-01-05 DIAGNOSIS — R5383 Other fatigue: Secondary | ICD-10-CM | POA: Diagnosis not present

## 2022-01-05 DIAGNOSIS — R5381 Other malaise: Secondary | ICD-10-CM | POA: Diagnosis not present

## 2022-01-05 DIAGNOSIS — F17210 Nicotine dependence, cigarettes, uncomplicated: Secondary | ICD-10-CM | POA: Diagnosis not present

## 2022-01-05 DIAGNOSIS — F411 Generalized anxiety disorder: Secondary | ICD-10-CM

## 2022-01-05 MED ORDER — LORAZEPAM 1 MG PO TABS: 0.5000 mg | ORAL_TABLET | Freq: Two times a day (BID) | ORAL | 0 refills | Status: DC

## 2022-01-05 NOTE — Telephone Encounter (Signed)
Done

## 2022-01-05 NOTE — Telephone Encounter (Signed)
Pt called requesting Ativan 1 mg as [facility] in [place] is out of stock of the 0.5 mg's. Writer called 3 other pharmacies in [place] and none have 0.5 mg. As there is now a Sport and exercise psychologist. Please review and advise.

## 2022-01-10 DIAGNOSIS — Z299 Encounter for prophylactic measures, unspecified: Secondary | ICD-10-CM | POA: Diagnosis not present

## 2022-01-10 DIAGNOSIS — I10 Essential (primary) hypertension: Secondary | ICD-10-CM | POA: Diagnosis not present

## 2022-01-10 DIAGNOSIS — F17210 Nicotine dependence, cigarettes, uncomplicated: Secondary | ICD-10-CM | POA: Diagnosis not present

## 2022-01-10 DIAGNOSIS — R202 Paresthesia of skin: Secondary | ICD-10-CM | POA: Diagnosis not present

## 2022-01-10 DIAGNOSIS — M545 Low back pain, unspecified: Secondary | ICD-10-CM | POA: Diagnosis not present

## 2022-01-10 DIAGNOSIS — R200 Anesthesia of skin: Secondary | ICD-10-CM | POA: Diagnosis not present

## 2022-01-24 DIAGNOSIS — Z299 Encounter for prophylactic measures, unspecified: Secondary | ICD-10-CM | POA: Diagnosis not present

## 2022-01-24 DIAGNOSIS — I10 Essential (primary) hypertension: Secondary | ICD-10-CM | POA: Diagnosis not present

## 2022-01-24 DIAGNOSIS — R739 Hyperglycemia, unspecified: Secondary | ICD-10-CM | POA: Diagnosis not present

## 2022-01-24 DIAGNOSIS — F17210 Nicotine dependence, cigarettes, uncomplicated: Secondary | ICD-10-CM | POA: Diagnosis not present

## 2022-02-02 ENCOUNTER — Other Ambulatory Visit (HOSPITAL_COMMUNITY): Payer: Self-pay | Admitting: Psychiatry

## 2022-02-02 DIAGNOSIS — F411 Generalized anxiety disorder: Secondary | ICD-10-CM

## 2022-02-05 DIAGNOSIS — I739 Peripheral vascular disease, unspecified: Secondary | ICD-10-CM | POA: Diagnosis not present

## 2022-02-05 DIAGNOSIS — J069 Acute upper respiratory infection, unspecified: Secondary | ICD-10-CM | POA: Diagnosis not present

## 2022-02-05 DIAGNOSIS — Z299 Encounter for prophylactic measures, unspecified: Secondary | ICD-10-CM | POA: Diagnosis not present

## 2022-02-05 DIAGNOSIS — I10 Essential (primary) hypertension: Secondary | ICD-10-CM | POA: Diagnosis not present

## 2022-02-12 DIAGNOSIS — I70211 Atherosclerosis of native arteries of extremities with intermittent claudication, right leg: Secondary | ICD-10-CM | POA: Diagnosis not present

## 2022-02-21 DIAGNOSIS — F17210 Nicotine dependence, cigarettes, uncomplicated: Secondary | ICD-10-CM | POA: Diagnosis not present

## 2022-02-21 DIAGNOSIS — Z299 Encounter for prophylactic measures, unspecified: Secondary | ICD-10-CM | POA: Diagnosis not present

## 2022-02-21 DIAGNOSIS — R350 Frequency of micturition: Secondary | ICD-10-CM | POA: Diagnosis not present

## 2022-02-21 DIAGNOSIS — I10 Essential (primary) hypertension: Secondary | ICD-10-CM | POA: Diagnosis not present

## 2022-02-22 ENCOUNTER — Other Ambulatory Visit: Payer: Self-pay | Admitting: Internal Medicine

## 2022-02-22 DIAGNOSIS — Z1231 Encounter for screening mammogram for malignant neoplasm of breast: Secondary | ICD-10-CM

## 2022-02-28 ENCOUNTER — Ambulatory Visit
Admission: RE | Admit: 2022-02-28 | Discharge: 2022-02-28 | Disposition: A | Discharge status: Home / Self Care | Payer: Medicare Other | Source: Ambulatory Visit | Attending: Internal Medicine | Admitting: Internal Medicine

## 2022-02-28 DIAGNOSIS — Z1231 Encounter for screening mammogram for malignant neoplasm of breast: Secondary | ICD-10-CM | POA: Diagnosis not present

## 2022-03-07 DIAGNOSIS — F17210 Nicotine dependence, cigarettes, uncomplicated: Secondary | ICD-10-CM | POA: Diagnosis not present

## 2022-03-07 DIAGNOSIS — I10 Essential (primary) hypertension: Secondary | ICD-10-CM | POA: Diagnosis not present

## 2022-03-07 DIAGNOSIS — Z299 Encounter for prophylactic measures, unspecified: Secondary | ICD-10-CM | POA: Diagnosis not present

## 2022-03-07 DIAGNOSIS — E785 Hyperlipidemia, unspecified: Secondary | ICD-10-CM | POA: Diagnosis not present

## 2022-03-08 DIAGNOSIS — M25512 Pain in left shoulder: Secondary | ICD-10-CM | POA: Diagnosis not present

## 2022-03-08 DIAGNOSIS — S42361S Displaced segmental fracture of shaft of humerus, right arm, sequela: Secondary | ICD-10-CM | POA: Diagnosis not present

## 2022-03-08 DIAGNOSIS — M25511 Pain in right shoulder: Secondary | ICD-10-CM | POA: Diagnosis not present

## 2022-03-23 DIAGNOSIS — M25511 Pain in right shoulder: Secondary | ICD-10-CM | POA: Diagnosis not present

## 2022-03-26 ENCOUNTER — Other Ambulatory Visit (HOSPITAL_COMMUNITY): Payer: Self-pay | Admitting: Psychiatry

## 2022-03-26 DIAGNOSIS — F331 Major depressive disorder, recurrent, moderate: Secondary | ICD-10-CM

## 2022-03-29 DIAGNOSIS — M75121 Complete rotator cuff tear or rupture of right shoulder, not specified as traumatic: Secondary | ICD-10-CM | POA: Diagnosis not present

## 2022-04-03 ENCOUNTER — Telehealth (HOSPITAL_BASED_OUTPATIENT_CLINIC_OR_DEPARTMENT_OTHER): Payer: Medicare Other | Admitting: Psychiatry

## 2022-04-03 ENCOUNTER — Encounter (HOSPITAL_COMMUNITY): Payer: Self-pay | Admitting: Psychiatry

## 2022-04-03 DIAGNOSIS — F411 Generalized anxiety disorder: Secondary | ICD-10-CM | POA: Diagnosis not present

## 2022-04-03 DIAGNOSIS — F331 Major depressive disorder, recurrent, moderate: Secondary | ICD-10-CM

## 2022-04-03 MED ORDER — BENZTROPINE MESYLATE 0.5 MG PO TABS: 0.5000 mg | ORAL_TABLET | Freq: Every day | ORAL | 0 refills | Status: DC

## 2022-04-03 MED ORDER — LORAZEPAM 0.5 MG PO TABS: ORAL_TABLET | ORAL | 2 refills | Status: DC

## 2022-04-03 MED ORDER — ARIPIPRAZOLE 5 MG PO TABS: 5.0000 mg | ORAL_TABLET | Freq: Every day | ORAL | 0 refills | Status: DC

## 2022-04-03 MED ORDER — VENLAFAXINE HCL ER 150 MG PO CP24: 150.0000 mg | ORAL_CAPSULE | Freq: Every day | ORAL | 0 refills | Status: DC

## 2022-04-03 MED ORDER — LAMOTRIGINE 150 MG PO TABS: 150.0000 mg | ORAL_TABLET | Freq: Two times a day (BID) | ORAL | 0 refills | Status: DC

## 2022-04-03 MED ORDER — VENLAFAXINE HCL ER 37.5 MG PO CP24: 37.5000 mg | ORAL_CAPSULE | Freq: Every day | ORAL | 0 refills | Status: DC
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
## 2022-04-30 DIAGNOSIS — S46011A Strain of muscle(s) and tendon(s) of the rotator cuff of right shoulder, initial encounter: Secondary | ICD-10-CM | POA: Diagnosis not present

## 2022-04-30 DIAGNOSIS — M24111 Other articular cartilage disorders, right shoulder: Secondary | ICD-10-CM | POA: Diagnosis not present

## 2022-04-30 DIAGNOSIS — G8918 Other acute postprocedural pain: Secondary | ICD-10-CM | POA: Diagnosis not present

## 2022-04-30 DIAGNOSIS — M75121 Complete rotator cuff tear or rupture of right shoulder, not specified as traumatic: Secondary | ICD-10-CM | POA: Diagnosis not present

## 2022-04-30 DIAGNOSIS — M7541 Impingement syndrome of right shoulder: Secondary | ICD-10-CM | POA: Diagnosis not present

## 2022-04-30 DIAGNOSIS — M7551 Bursitis of right shoulder: Secondary | ICD-10-CM | POA: Diagnosis not present

## 2022-05-10 DIAGNOSIS — Z299 Encounter for prophylactic measures, unspecified: Secondary | ICD-10-CM | POA: Diagnosis not present

## 2022-05-10 DIAGNOSIS — F17210 Nicotine dependence, cigarettes, uncomplicated: Secondary | ICD-10-CM | POA: Diagnosis not present

## 2022-05-10 DIAGNOSIS — I10 Essential (primary) hypertension: Secondary | ICD-10-CM | POA: Diagnosis not present

## 2022-05-16 ENCOUNTER — Other Ambulatory Visit (HOSPITAL_COMMUNITY): Payer: Self-pay | Admitting: Psychiatry

## 2022-05-16 DIAGNOSIS — F411 Generalized anxiety disorder: Secondary | ICD-10-CM

## 2022-05-31 ENCOUNTER — Other Ambulatory Visit (HOSPITAL_COMMUNITY): Payer: Self-pay | Admitting: Psychiatry

## 2022-05-31 DIAGNOSIS — F331 Major depressive disorder, recurrent, moderate: Secondary | ICD-10-CM

## 2022-05-31 DIAGNOSIS — F411 Generalized anxiety disorder: Secondary | ICD-10-CM

## 2022-06-03 ENCOUNTER — Other Ambulatory Visit (HOSPITAL_COMMUNITY): Payer: Self-pay | Admitting: Psychiatry

## 2022-06-03 DIAGNOSIS — F411 Generalized anxiety disorder: Secondary | ICD-10-CM

## 2022-06-03 DIAGNOSIS — F331 Major depressive disorder, recurrent, moderate: Secondary | ICD-10-CM

## 2022-06-04 DIAGNOSIS — S43421A Sprain of right rotator cuff capsule, initial encounter: Secondary | ICD-10-CM | POA: Diagnosis not present

## 2022-06-11 DIAGNOSIS — S43421A Sprain of right rotator cuff capsule, initial encounter: Secondary | ICD-10-CM | POA: Diagnosis not present

## 2022-06-13 ENCOUNTER — Ambulatory Visit: Payer: Medicare Other | Admitting: Neurology

## 2022-06-13 DIAGNOSIS — S43421A Sprain of right rotator cuff capsule, initial encounter: Secondary | ICD-10-CM | POA: Diagnosis not present

## 2022-06-18 DIAGNOSIS — S43421A Sprain of right rotator cuff capsule, initial encounter: Secondary | ICD-10-CM | POA: Diagnosis not present

## 2022-06-25 DIAGNOSIS — S43421A Sprain of right rotator cuff capsule, initial encounter: Secondary | ICD-10-CM | POA: Diagnosis not present

## 2022-06-27 ENCOUNTER — Other Ambulatory Visit (HOSPITAL_COMMUNITY): Payer: Self-pay | Admitting: Psychiatry

## 2022-06-27 DIAGNOSIS — S43421A Sprain of right rotator cuff capsule, initial encounter: Secondary | ICD-10-CM | POA: Diagnosis not present

## 2022-06-27 DIAGNOSIS — F331 Major depressive disorder, recurrent, moderate: Secondary | ICD-10-CM

## 2022-06-28 DIAGNOSIS — M25512 Pain in left shoulder: Secondary | ICD-10-CM | POA: Diagnosis not present

## 2022-07-02 ENCOUNTER — Telehealth (HOSPITAL_COMMUNITY): Payer: Self-pay | Admitting: *Deleted

## 2022-07-02 NOTE — Telephone Encounter (Signed)
Rx Refill [place], [place] - [place]   ARIPiprazole (ABILIFY) 5 MG tablet  LAST FILL DATE: 04/05/22  NEXT APPT:  07/03/22 LAST APPT:   04/03/22

## 2022-07-03 ENCOUNTER — Telehealth (HOSPITAL_BASED_OUTPATIENT_CLINIC_OR_DEPARTMENT_OTHER): Payer: Medicare Other | Admitting: Psychiatry

## 2022-07-03 ENCOUNTER — Encounter (HOSPITAL_COMMUNITY): Payer: Self-pay | Admitting: Psychiatry

## 2022-07-03 DIAGNOSIS — F331 Major depressive disorder, recurrent, moderate: Secondary | ICD-10-CM | POA: Diagnosis not present

## 2022-07-03 DIAGNOSIS — F411 Generalized anxiety disorder: Secondary | ICD-10-CM | POA: Diagnosis not present

## 2022-07-03 MED ORDER — VENLAFAXINE HCL ER 150 MG PO CP24: 150.0000 mg | ORAL_CAPSULE | Freq: Every day | ORAL | 0 refills | Status: DC

## 2022-07-03 MED ORDER — LAMOTRIGINE 150 MG PO TABS: 150.0000 mg | ORAL_TABLET | Freq: Two times a day (BID) | ORAL | 0 refills | Status: DC

## 2022-07-03 MED ORDER — ARIPIPRAZOLE 5 MG PO TABS: 5.0000 mg | ORAL_TABLET | Freq: Every day | ORAL | 0 refills | Status: DC

## 2022-07-03 MED ORDER — BENZTROPINE MESYLATE 1 MG PO TABS: 1.0000 mg | ORAL_TABLET | Freq: Every day | ORAL | 1 refills | Status: DC

## 2022-07-03 MED ORDER — LORAZEPAM 0.5 MG PO TABS: 0.5000 mg | ORAL_TABLET | Freq: Three times a day (TID) | ORAL | 1 refills | Status: DC
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
## 2022-07-04 DIAGNOSIS — S43421A Sprain of right rotator cuff capsule, initial encounter: Secondary | ICD-10-CM | POA: Diagnosis not present

## 2022-07-13 ENCOUNTER — Other Ambulatory Visit (HOSPITAL_COMMUNITY): Payer: Self-pay | Admitting: Psychiatry

## 2022-07-13 DIAGNOSIS — F411 Generalized anxiety disorder: Secondary | ICD-10-CM

## 2022-07-16 ENCOUNTER — Telehealth (HOSPITAL_COMMUNITY): Payer: Self-pay | Admitting: *Deleted

## 2022-07-16 DIAGNOSIS — S43421A Sprain of right rotator cuff capsule, initial encounter: Secondary | ICD-10-CM | POA: Diagnosis not present

## 2022-07-16 NOTE — Telephone Encounter (Signed)
Rx Refill Request--  LORazepam (ATIVAN) 0.5 MG tablet   [place], [place] - [place]  LAST FILL DATE: 01/05/2  LAST APPT::    07/03/22 NEXT APPT::   08/16/22

## 2022-07-17 NOTE — Telephone Encounter (Signed)
She is too early for a refill.  Please check the chart.

## 2022-07-18 DIAGNOSIS — S43421A Sprain of right rotator cuff capsule, initial encounter: Secondary | ICD-10-CM | POA: Diagnosis not present

## 2022-07-23 DIAGNOSIS — S43421A Sprain of right rotator cuff capsule, initial encounter: Secondary | ICD-10-CM | POA: Diagnosis not present

## 2022-07-25 DIAGNOSIS — S43421A Sprain of right rotator cuff capsule, initial encounter: Secondary | ICD-10-CM | POA: Diagnosis not present

## 2022-07-31 DIAGNOSIS — S43421A Sprain of right rotator cuff capsule, initial encounter: Secondary | ICD-10-CM | POA: Diagnosis not present

## 2022-08-07 DIAGNOSIS — R5383 Other fatigue: Secondary | ICD-10-CM | POA: Diagnosis not present

## 2022-08-07 DIAGNOSIS — Z0000 Encounter for general adult medical examination without abnormal findings: Secondary | ICD-10-CM | POA: Diagnosis not present

## 2022-08-07 DIAGNOSIS — F17210 Nicotine dependence, cigarettes, uncomplicated: Secondary | ICD-10-CM | POA: Diagnosis not present

## 2022-08-07 DIAGNOSIS — E7800 Pure hypercholesterolemia, unspecified: Secondary | ICD-10-CM | POA: Diagnosis not present

## 2022-08-07 DIAGNOSIS — Z79899 Other long term (current) drug therapy: Secondary | ICD-10-CM | POA: Diagnosis not present

## 2022-08-07 DIAGNOSIS — I10 Essential (primary) hypertension: Secondary | ICD-10-CM | POA: Diagnosis not present

## 2022-08-07 DIAGNOSIS — I739 Peripheral vascular disease, unspecified: Secondary | ICD-10-CM | POA: Diagnosis not present

## 2022-08-07 DIAGNOSIS — R739 Hyperglycemia, unspecified: Secondary | ICD-10-CM | POA: Diagnosis not present

## 2022-08-07 DIAGNOSIS — I700 Atherosclerosis of aorta: Secondary | ICD-10-CM | POA: Diagnosis not present

## 2022-08-07 DIAGNOSIS — Z299 Encounter for prophylactic measures, unspecified: Secondary | ICD-10-CM | POA: Diagnosis not present

## 2022-08-07 DIAGNOSIS — Z7189 Other specified counseling: Secondary | ICD-10-CM | POA: Diagnosis not present

## 2022-08-15 ENCOUNTER — Telehealth (HOSPITAL_COMMUNITY): Payer: Medicare Other | Admitting: Psychiatry

## 2022-08-16 ENCOUNTER — Encounter (HOSPITAL_COMMUNITY): Payer: Self-pay | Admitting: Psychiatry

## 2022-08-16 ENCOUNTER — Ambulatory Visit (HOSPITAL_BASED_OUTPATIENT_CLINIC_OR_DEPARTMENT_OTHER): Payer: Medicare Other | Admitting: Psychiatry

## 2022-08-16 VITALS — BP 136/89 | HR 68 | Ht 67.0 in | Wt 229.0 lb

## 2022-08-16 DIAGNOSIS — F5101 Primary insomnia: Secondary | ICD-10-CM

## 2022-08-16 DIAGNOSIS — F331 Major depressive disorder, recurrent, moderate: Secondary | ICD-10-CM

## 2022-08-16 DIAGNOSIS — R251 Tremor, unspecified: Secondary | ICD-10-CM | POA: Diagnosis not present

## 2022-08-16 DIAGNOSIS — F411 Generalized anxiety disorder: Secondary | ICD-10-CM | POA: Diagnosis not present

## 2022-08-16 MED ORDER — ARIPIPRAZOLE 5 MG PO TABS: 5.0000 mg | ORAL_TABLET | Freq: Every day | ORAL | 0 refills | Status: DC

## 2022-08-16 MED ORDER — MELATONIN 10 MG PO TABS
10.0000 mg | ORAL_TABLET | Freq: Every day | ORAL | 0 refills | Status: AC
Start: 2022-08-16 — End: ?

## 2022-08-16 MED ORDER — LORAZEPAM 0.5 MG PO TABS
0.5000 mg | ORAL_TABLET | Freq: Three times a day (TID) | ORAL | 1 refills | Status: DC | PRN
Start: 2022-08-16 — End: 2022-11-15

## 2022-08-16 MED ORDER — LAMOTRIGINE 150 MG PO TABS: 150.0000 mg | ORAL_TABLET | Freq: Two times a day (BID) | ORAL | 0 refills | Status: DC

## 2022-08-16 MED ORDER — VENLAFAXINE HCL ER 150 MG PO CP24
150.0000 mg | ORAL_CAPSULE | Freq: Every day | ORAL | 0 refills | Status: DC
Start: 2022-08-16 — End: 2022-11-15

## 2022-08-16 MED ORDER — HYDROXYZINE HCL 10 MG PO TABS
10.00 mg | ORAL_TABLET | Freq: Three times a day (TID) | ORAL | 1 refills | Status: DC | PRN
Start: 2022-08-16 — End: 2022-10-26
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
## 2022-08-17 DIAGNOSIS — M8440XA Pathological fracture, unspecified site, initial encounter for fracture: Secondary | ICD-10-CM | POA: Diagnosis not present

## 2022-08-17 DIAGNOSIS — Z79899 Other long term (current) drug therapy: Secondary | ICD-10-CM | POA: Diagnosis not present

## 2022-08-17 DIAGNOSIS — Z13820 Encounter for screening for osteoporosis: Secondary | ICD-10-CM | POA: Diagnosis not present

## 2022-09-05 DIAGNOSIS — L570 Actinic keratosis: Secondary | ICD-10-CM | POA: Diagnosis not present

## 2022-09-05 DIAGNOSIS — D485 Neoplasm of uncertain behavior of skin: Secondary | ICD-10-CM | POA: Diagnosis not present

## 2022-09-06 DIAGNOSIS — M25512 Pain in left shoulder: Secondary | ICD-10-CM | POA: Diagnosis not present

## 2022-09-06 DIAGNOSIS — Z9889 Other specified postprocedural states: Secondary | ICD-10-CM | POA: Diagnosis not present

## 2022-10-02 ENCOUNTER — Other Ambulatory Visit (HOSPITAL_COMMUNITY): Payer: Self-pay | Admitting: Psychiatry

## 2022-10-02 DIAGNOSIS — F331 Major depressive disorder, recurrent, moderate: Secondary | ICD-10-CM

## 2022-10-11 DIAGNOSIS — Z9889 Other specified postprocedural states: Secondary | ICD-10-CM | POA: Diagnosis not present

## 2022-10-11 DIAGNOSIS — S42301A Unspecified fracture of shaft of humerus, right arm, initial encounter for closed fracture: Secondary | ICD-10-CM | POA: Diagnosis not present

## 2022-10-11 DIAGNOSIS — M25512 Pain in left shoulder: Secondary | ICD-10-CM | POA: Diagnosis not present

## 2022-10-23 ENCOUNTER — Other Ambulatory Visit (HOSPITAL_COMMUNITY): Payer: Self-pay | Admitting: Psychiatry

## 2022-10-23 DIAGNOSIS — R251 Tremor, unspecified: Secondary | ICD-10-CM

## 2022-10-26 ENCOUNTER — Other Ambulatory Visit (HOSPITAL_COMMUNITY): Payer: Self-pay

## 2022-10-26 DIAGNOSIS — R251 Tremor, unspecified: Secondary | ICD-10-CM

## 2022-10-26 MED ORDER — HYDROXYZINE HCL 10 MG PO TABS
10.0000 mg | ORAL_TABLET | Freq: Three times a day (TID) | ORAL | 0 refills | Status: DC | PRN
Start: 2022-10-26 — End: 2022-11-15

## 2022-10-30 DIAGNOSIS — S42301D Unspecified fracture of shaft of humerus, right arm, subsequent encounter for fracture with routine healing: Secondary | ICD-10-CM | POA: Diagnosis not present

## 2022-11-14 ENCOUNTER — Telehealth (HOSPITAL_COMMUNITY): Payer: Medicare Other | Admitting: Psychiatry

## 2022-11-15 ENCOUNTER — Encounter (HOSPITAL_COMMUNITY): Payer: Self-pay | Admitting: Psychiatry

## 2022-11-15 ENCOUNTER — Telehealth (HOSPITAL_BASED_OUTPATIENT_CLINIC_OR_DEPARTMENT_OTHER): Payer: Medicare Other | Admitting: Psychiatry

## 2022-11-15 VITALS — Wt 230.0 lb

## 2022-11-15 DIAGNOSIS — F411 Generalized anxiety disorder: Secondary | ICD-10-CM | POA: Diagnosis not present

## 2022-11-15 DIAGNOSIS — F331 Major depressive disorder, recurrent, moderate: Secondary | ICD-10-CM

## 2022-11-15 DIAGNOSIS — R251 Tremor, unspecified: Secondary | ICD-10-CM | POA: Diagnosis not present

## 2022-11-15 DIAGNOSIS — F5101 Primary insomnia: Secondary | ICD-10-CM | POA: Diagnosis not present

## 2022-11-15 MED ORDER — LAMOTRIGINE 150 MG PO TABS
150.0000 mg | ORAL_TABLET | Freq: Two times a day (BID) | ORAL | 0 refills | Status: DC
Start: 2022-11-15 — End: 2023-02-12

## 2022-11-15 MED ORDER — VENLAFAXINE HCL ER 150 MG PO CP24
150.0000 mg | ORAL_CAPSULE | Freq: Every day | ORAL | 0 refills | Status: DC
Start: 2022-11-15 — End: 2023-02-12

## 2022-11-15 MED ORDER — HYDROXYZINE HCL 10 MG PO TABS
10.0000 mg | ORAL_TABLET | Freq: Three times a day (TID) | ORAL | 0 refills | Status: DC | PRN
Start: 2022-11-15 — End: 2023-01-01

## 2022-11-15 MED ORDER — LORAZEPAM 0.5 MG PO TABS
0.5000 mg | ORAL_TABLET | Freq: Three times a day (TID) | ORAL | 1 refills | Status: DC | PRN
Start: 2022-11-15 — End: 2023-02-12

## 2022-11-15 MED ORDER — ARIPIPRAZOLE 5 MG PO TABS
5.0000 mg | ORAL_TABLET | Freq: Every day | ORAL | 0 refills | Status: DC
Start: 2022-11-15 — End: 2023-02-12
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
## 2022-11-27 DIAGNOSIS — Z299 Encounter for prophylactic measures, unspecified: Secondary | ICD-10-CM | POA: Diagnosis not present

## 2022-11-27 DIAGNOSIS — R61 Generalized hyperhidrosis: Secondary | ICD-10-CM | POA: Diagnosis not present

## 2022-11-27 DIAGNOSIS — I10 Essential (primary) hypertension: Secondary | ICD-10-CM | POA: Diagnosis not present

## 2022-11-27 DIAGNOSIS — R5383 Other fatigue: Secondary | ICD-10-CM | POA: Diagnosis not present

## 2022-12-06 DIAGNOSIS — S42301D Unspecified fracture of shaft of humerus, right arm, subsequent encounter for fracture with routine healing: Secondary | ICD-10-CM | POA: Diagnosis not present

## 2022-12-11 DIAGNOSIS — F17210 Nicotine dependence, cigarettes, uncomplicated: Secondary | ICD-10-CM | POA: Diagnosis not present

## 2022-12-11 DIAGNOSIS — Z0000 Encounter for general adult medical examination without abnormal findings: Secondary | ICD-10-CM | POA: Diagnosis not present

## 2022-12-11 DIAGNOSIS — Z299 Encounter for prophylactic measures, unspecified: Secondary | ICD-10-CM | POA: Diagnosis not present

## 2022-12-11 DIAGNOSIS — I10 Essential (primary) hypertension: Secondary | ICD-10-CM | POA: Diagnosis not present

## 2022-12-11 DIAGNOSIS — I739 Peripheral vascular disease, unspecified: Secondary | ICD-10-CM | POA: Diagnosis not present

## 2022-12-14 DIAGNOSIS — S42301D Unspecified fracture of shaft of humerus, right arm, subsequent encounter for fracture with routine healing: Secondary | ICD-10-CM | POA: Diagnosis not present

## 2022-12-17 ENCOUNTER — Other Ambulatory Visit (HOSPITAL_COMMUNITY): Payer: Self-pay | Admitting: Psychiatry

## 2022-12-17 DIAGNOSIS — F5101 Primary insomnia: Secondary | ICD-10-CM

## 2022-12-17 DIAGNOSIS — R251 Tremor, unspecified: Secondary | ICD-10-CM

## 2022-12-23 ENCOUNTER — Other Ambulatory Visit (HOSPITAL_COMMUNITY): Payer: Self-pay | Admitting: Psychiatry

## 2022-12-23 DIAGNOSIS — F5101 Primary insomnia: Secondary | ICD-10-CM

## 2022-12-23 DIAGNOSIS — R251 Tremor, unspecified: Secondary | ICD-10-CM

## 2022-12-25 ENCOUNTER — Encounter: Payer: Self-pay | Admitting: Obstetrics & Gynecology

## 2022-12-25 ENCOUNTER — Ambulatory Visit: Payer: Medicare Other | Admitting: Obstetrics & Gynecology

## 2022-12-25 VITALS — BP 132/76 | HR 78 | Ht 67.0 in | Wt 237.0 lb

## 2022-12-25 DIAGNOSIS — N3281 Overactive bladder: Secondary | ICD-10-CM | POA: Diagnosis not present

## 2022-12-25 DIAGNOSIS — N3941 Urge incontinence: Secondary | ICD-10-CM | POA: Diagnosis not present

## 2022-12-25 DIAGNOSIS — N951 Menopausal and female climacteric states: Secondary | ICD-10-CM

## 2022-12-25 MED ORDER — SOLIFENACIN SUCCINATE 10 MG PO TABS: ORAL_TABLET | ORAL | 11 refills | Status: DC

## 2022-12-25 MED ORDER — VEOZAH 45 MG PO TABS: 1.0000 | ORAL_TABLET | Freq: Every day | ORAL | 11 refills | Status: AC

## 2022-12-25 NOTE — Progress Notes (Signed)
Chief Complaint  Patient presents with   Gynecologic Exam    "My hormones are going crazy, hot flashes, irritablilty." "I cannot hold my urine."      52 y.o. No obstetric history on file. No LMP recorded. Patient has had a hysterectomy. The current method of family planning is status post hysterectomy.  Outpatient Encounter Medications as of 12/25/2022  Medication Sig   ARIPiprazole (ABILIFY) 5 MG tablet Take 1 tablet (5 mg total) by mouth daily.   Biotin 1000 MCG tablet Take 1,000 mcg by mouth daily.   docusate sodium (STOOL SOFTENER) 100 MG capsule Take 100 mg by mouth 2 (two) times daily.   Fezolinetant (VEOZAH) 45 MG TABS Take 1 tablet (45 mg total) by mouth at bedtime.   hydrOXYzine (ATARAX) 10 MG tablet Take 1-2 tablets (10-20 mg total) by mouth 3 (three) times daily as needed.   lamoTRIgine (LAMICTAL) 150 MG tablet Take 1 tablet (150 mg total) by mouth 2 (two) times daily.   LORazepam (ATIVAN) 0.5 MG tablet Take 1 tablet (0.5 mg total) by mouth every 8 (eight) hours as needed for anxiety.   Melatonin 10 MG TABS Take 10 mg by mouth at bedtime.   Menthol, Topical Analgesic, (BIOFREEZE) 10 % LIQD Apply 1 application topically daily as needed (pain).   pantoprazole (PROTONIX) 40 MG tablet Take 40 mg by mouth daily.   Probiotic Product (PROBIOTIC DAILY PO) Take 1 capsule by mouth daily.   rosuvastatin (CRESTOR) 20 MG tablet Take 20 mg by mouth daily.   solifenacin (VESICARE) 10 MG tablet 1 daily at bedtime   venlafaxine XR (EFFEXOR-XR) 150 MG 24 hr capsule Take 1 capsule (150 mg total) by mouth daily.   benztropine (COGENTIN) 1 MG tablet Take 1 tablet (1 mg total) by mouth at bedtime. (Patient not taking: Reported on 08/16/2022)   lisinopril-hydrochlorothiazide (PRINZIDE,ZESTORETIC) 20-12.5 MG tablet Take 1 tablet by mouth daily. (Patient not taking: Reported on 12/25/2022)   nicotine (NICODERM CQ - DOSED IN MG/24 HOURS) 21 mg/24hr patch Place onto the skin. (Patient not taking:  Reported on 12/25/2022)   No facility-administered encounter medications on file as of 12/25/2022.    Subjective Pt with ^vasomotor symptoms and urinary frequency urgency loss over the past months to year Disrupts sleep Gets up 4 times per night with sweats or urination Multiple hot flushes during the day Increasing with time for both Past Medical History:  Diagnosis Date   Anxiety    Depression    GERD (gastroesophageal reflux disease)    Headache(784.0)    Hernia    Hyperlipemia    Hypertension    Panic attack    Sleep apnea     Past Surgical History:  Procedure Laterality Date   ABDOMINAL HYSTERECTOMY     APPENDECTOMY     COLONOSCOPY  2022   CORONARY ANGIOPLASTY     CYSTECTOMY  09/2020   rt breast   MOUTH SURGERY     MYRINGOTOMY WITH TUBE PLACEMENT Right 09/30/2018   Procedure: MYRINGOTOMY WITH RIGHT TUBE PLACEMENT;  Surgeon: Newman Pies, MD;  Location: [facility];  Service: ENT;  Laterality: Right;   RADIOLOGY WITH ANESTHESIA N/A 11/15/2020   Procedure: MRI WITH ANESTHESIA OF BRAIN WITHOUT CONTRAST;  Surgeon: Radiologist, Medication, MD;  Location: [facility] OR;  Service: Radiology;  Laterality: N/A;    OB History   No obstetric history on file.     Allergies  Allergen Reactions   Sulfa Antibiotics Other (See Comments)  REACTION: "Shut down immune system with nausea and vomiting, dehydration" was given for bladder infection, resulted in hospitalization   Septra [Sulfamethoxazole-Trimethoprim]     Social History   Socioeconomic History   Marital status: Divorced    Spouse name: Not on file   Number of children: 0   Years of education: College   Highest education level: Not on file  Occupational History   Occupation: Secondary school teacher    Employer: [employer]  Tobacco Use   Smoking status: Every Day    Current packs/day: 1.00    Average packs/day: 1 pack/day for 30.0 years (30.0 ttl pk-yrs)    Types: Cigarettes   Smokeless tobacco: Never  Vaping  Use   Vaping status: Some Days  Substance and Sexual Activity   Alcohol use: Not Currently   Drug use: No   Sexual activity: Not Currently    Birth control/protection: Surgical    Comment: hysterectomy  Other Topics Concern   Not on file  Social History Narrative   Patient lives at home with mother.   Caffeine Use:1 cup of coffee daily; 3 sodas weekly   Right-handed   Social Determinants of Health   Financial Resource Strain: Medium Risk (12/25/2022)   Overall Financial Resource Strain (CARDIA)    Difficulty of Paying Living Expenses: Somewhat hard  Food Insecurity: Food Insecurity Present (12/25/2022)   Hunger Vital Sign    Worried About Running Out of Food in the Last Year: Never true    Ran Out of Food in the Last Year: Sometimes true  Transportation Needs: No Transportation Needs (12/25/2022)   PRAPARE - Administrator, Civil Service (Medical): No    Lack of Transportation (Non-Medical): No  Physical Activity: Inactive (12/25/2022)   Exercise Vital Sign    Days of Exercise per Week: 0 days    Minutes of Exercise per Session: 0 min  Stress: Stress Concern Present (12/25/2022)   [employer] of Occupational Health - Occupational Stress Questionnaire    Feeling of Stress : To some extent  Social Connections: Moderately Isolated (12/25/2022)   Social Connection and Isolation Panel [NHANES]    Frequency of Communication with Friends and Family: More than three times a week    Frequency of Social Gatherings with Friends and Family: More than three times a week    Attends Religious Services: More than 4 times per year    Active Member of [employer] or Organizations: No    Attends Engineer, structural: Never    Marital Status: Divorced    Family History  Problem Relation Age of Onset   Atrial fibrillation Mother    Alcohol abuse Father    Depression Father    Seizures Father    Depression Sister    Anxiety disorder Sister    Bipolar disorder Sister    Breast  cancer Neg Hx     Medications:       Current Outpatient Medications:    ARIPiprazole (ABILIFY) 5 MG tablet, Take 1 tablet (5 mg total) by mouth daily., Disp: 90 tablet, Rfl: 0   Biotin 1000 MCG tablet, Take 1,000 mcg by mouth daily., Disp: , Rfl:    docusate sodium (STOOL SOFTENER) 100 MG capsule, Take 100 mg by mouth 2 (two) times daily., Disp: , Rfl:    Fezolinetant (VEOZAH) 45 MG TABS, Take 1 tablet (45 mg total) by mouth at bedtime., Disp: 30 tablet, Rfl: 11   hydrOXYzine (ATARAX) 10 MG tablet, Take 1-2 tablets (10-20 mg  total) by mouth 3 (three) times daily as needed., Disp: 90 tablet, Rfl: 0   lamoTRIgine (LAMICTAL) 150 MG tablet, Take 1 tablet (150 mg total) by mouth 2 (two) times daily., Disp: 180 tablet, Rfl: 0   LORazepam (ATIVAN) 0.5 MG tablet, Take 1 tablet (0.5 mg total) by mouth every 8 (eight) hours as needed for anxiety., Disp: 90 tablet, Rfl: 1   Melatonin 10 MG TABS, Take 10 mg by mouth at bedtime., Disp: 30 tablet, Rfl: 0   Menthol, Topical Analgesic, (BIOFREEZE) 10 % LIQD, Apply 1 application topically daily as needed (pain)., Disp: , Rfl:    pantoprazole (PROTONIX) 40 MG tablet, Take 40 mg by mouth daily., Disp: , Rfl:    Probiotic Product (PROBIOTIC DAILY PO), Take 1 capsule by mouth daily., Disp: , Rfl:    rosuvastatin (CRESTOR) 20 MG tablet, Take 20 mg by mouth daily., Disp: , Rfl:    solifenacin (VESICARE) 10 MG tablet, 1 daily at bedtime, Disp: 30 tablet, Rfl: 11   venlafaxine XR (EFFEXOR-XR) 150 MG 24 hr capsule, Take 1 capsule (150 mg total) by mouth daily., Disp: 90 capsule, Rfl: 0   benztropine (COGENTIN) 1 MG tablet, Take 1 tablet (1 mg total) by mouth at bedtime. (Patient not taking: Reported on 08/16/2022), Disp: 30 tablet, Rfl: 1   lisinopril-hydrochlorothiazide (PRINZIDE,ZESTORETIC) 20-12.5 MG tablet, Take 1 tablet by mouth daily. (Patient not taking: Reported on 12/25/2022), Disp: , Rfl:    nicotine (NICODERM CQ - DOSED IN MG/24 HOURS) 21 mg/24hr patch, Place  onto the skin. (Patient not taking: Reported on 12/25/2022), Disp: , Rfl:   Objective Blood pressure 132/76, pulse 78, height 5\' 7"  (1.702 m), weight 237 lb (107.5 kg).  Gen WDWN NAD  Pertinent ROS No burning with urination, frequency or urgency No nausea, vomiting or diarrhea Nor fever chills or other constitutional symptoms   Labs or studies No new    Impression + Management Plan: Diagnoses this Encounter::   ICD-10-CM   1. Vasomotor symptoms due to menopause  N95.1     2. OAB (overactive bladder)  N32.81     3. Urge incontinence  N39.41         Medications prescribed during  this encounter: Meds ordered this encounter  Medications   Fezolinetant (VEOZAH) 45 MG TABS    Sig: Take 1 tablet (45 mg total) by mouth at bedtime.    Dispense:  30 tablet    Refill:  11   solifenacin (VESICARE) 10 MG tablet    Sig: 1 daily at bedtime    Dispense:  30 tablet    Refill:  11    Labs or Scans Ordered during this encounter: No orders of the defined types were placed in this encounter.     Follow up Return in about 2 months (around 02/24/2023) for Follow up, with Dr Despina Hidden.

## 2022-12-29 ENCOUNTER — Other Ambulatory Visit (HOSPITAL_COMMUNITY): Payer: Self-pay | Admitting: Psychiatry

## 2022-12-29 DIAGNOSIS — F5101 Primary insomnia: Secondary | ICD-10-CM

## 2022-12-29 DIAGNOSIS — R251 Tremor, unspecified: Secondary | ICD-10-CM

## 2023-01-01 ENCOUNTER — Other Ambulatory Visit (HOSPITAL_COMMUNITY): Payer: Self-pay

## 2023-01-01 DIAGNOSIS — R251 Tremor, unspecified: Secondary | ICD-10-CM

## 2023-01-01 DIAGNOSIS — F5101 Primary insomnia: Secondary | ICD-10-CM

## 2023-01-01 MED ORDER — HYDROXYZINE HCL 10 MG PO TABS
10.0000 mg | ORAL_TABLET | Freq: Three times a day (TID) | ORAL | 0 refills | Status: DC | PRN
Start: 2023-01-01 — End: 2023-01-29

## 2023-01-04 DIAGNOSIS — Z299 Encounter for prophylactic measures, unspecified: Secondary | ICD-10-CM | POA: Diagnosis not present

## 2023-01-04 DIAGNOSIS — R52 Pain, unspecified: Secondary | ICD-10-CM | POA: Diagnosis not present

## 2023-01-04 DIAGNOSIS — M5416 Radiculopathy, lumbar region: Secondary | ICD-10-CM | POA: Diagnosis not present

## 2023-01-04 DIAGNOSIS — R569 Unspecified convulsions: Secondary | ICD-10-CM | POA: Diagnosis not present

## 2023-01-04 DIAGNOSIS — I10 Essential (primary) hypertension: Secondary | ICD-10-CM | POA: Diagnosis not present

## 2023-01-04 DIAGNOSIS — M461 Sacroiliitis, not elsewhere classified: Secondary | ICD-10-CM | POA: Diagnosis not present

## 2023-01-07 ENCOUNTER — Telehealth: Payer: Self-pay | Admitting: Obstetrics & Gynecology

## 2023-01-07 NOTE — Telephone Encounter (Signed)
PA was denied

## 2023-01-07 NOTE — Telephone Encounter (Signed)
Pts insurance will not cover veozah. Pt requesting different rx.

## 2023-01-07 NOTE — Telephone Encounter (Signed)
Patient called to let you know that her insurance won't cover the medicine prescribed. She said you had another medicine you would try.Please advise.

## 2023-01-08 ENCOUNTER — Encounter: Payer: Self-pay | Admitting: Obstetrics & Gynecology

## 2023-01-09 DIAGNOSIS — M47816 Spondylosis without myelopathy or radiculopathy, lumbar region: Secondary | ICD-10-CM | POA: Diagnosis not present

## 2023-01-09 DIAGNOSIS — I700 Atherosclerosis of aorta: Secondary | ICD-10-CM | POA: Diagnosis not present

## 2023-01-09 DIAGNOSIS — M545 Low back pain, unspecified: Secondary | ICD-10-CM | POA: Diagnosis not present

## 2023-01-11 ENCOUNTER — Telehealth: Payer: Self-pay | Admitting: Obstetrics & Gynecology

## 2023-01-11 NOTE — Telephone Encounter (Signed)
Spoke with patient and informed her of Dr. Alyssa Grove recommendation. She will let me know when she is down to 1 week of medicine. Once she has used med a few months we can try to appeal the insurance.

## 2023-01-11 NOTE — Telephone Encounter (Signed)
Patient calling stating that the Allyne Gee states that even with the coupon and after insurance it's over $600 and wants to know is there anything else that she can take and send script to [facility].

## 2023-01-16 DIAGNOSIS — Z299 Encounter for prophylactic measures, unspecified: Secondary | ICD-10-CM | POA: Diagnosis not present

## 2023-01-16 DIAGNOSIS — R739 Hyperglycemia, unspecified: Secondary | ICD-10-CM | POA: Diagnosis not present

## 2023-01-16 DIAGNOSIS — Z23 Encounter for immunization: Secondary | ICD-10-CM | POA: Diagnosis not present

## 2023-01-16 DIAGNOSIS — I10 Essential (primary) hypertension: Secondary | ICD-10-CM | POA: Diagnosis not present

## 2023-01-16 DIAGNOSIS — M25559 Pain in unspecified hip: Secondary | ICD-10-CM | POA: Diagnosis not present

## 2023-01-22 IMAGING — MR MR HEAD W/O CM
10 of 14 series · 19 of 48 positions shown · non-contrast
Comparison: Report from head CT 12/25/2016 (images currently
unavailable). Head CT 10/05/2011.

CLINICAL DATA: Chronic daily headache PPT.0 (IO0-VW-CM). Chronic
headaches, staring episodes. Staring episodes ZPZ.P (IO0-VW-CM).

EXAM:
MRI HEAD WITHOUT CONTRAST
TECHNIQUE: Multiplanar, multiecho pulse sequences of the brain and surrounding
structures were obtained without intravenous contrast.

[Series 2: DWI · axial · 3.0mm · 0.94mm/px · z∈[-47,+85]mm · 5 of 91 slices shown (1 of 2)]
[im 1/91]
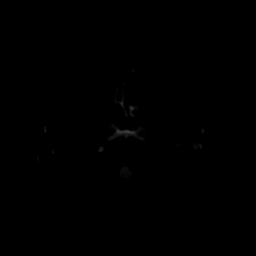
[im 23/91]
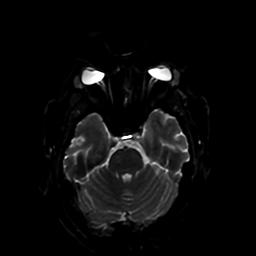
[im 46/91]
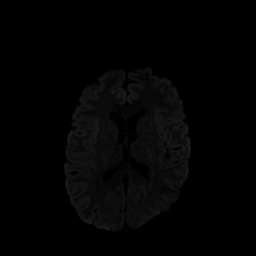
[im 68/91]
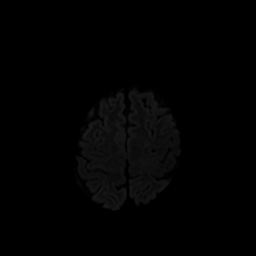
[im 91/91]
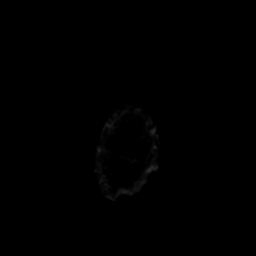

[Series 3: DWI · coronal · 4.0mm · 0.94mm/px · 3 of 66 slices shown (2 of 2)]
[im 1/66]
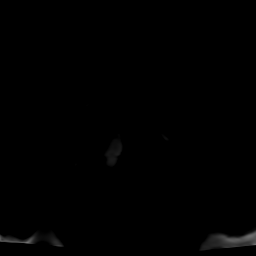
[im 33/66]
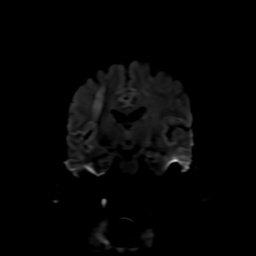
[im 66/66]
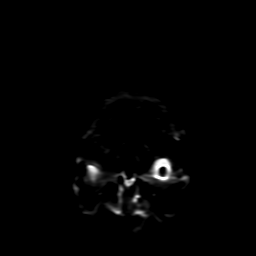

[Series 4: FLAIR · sagittal · 5.0mm · 0.23mm/px · 1 of 25 slices shown (1 of 3)]
[im 1/25]
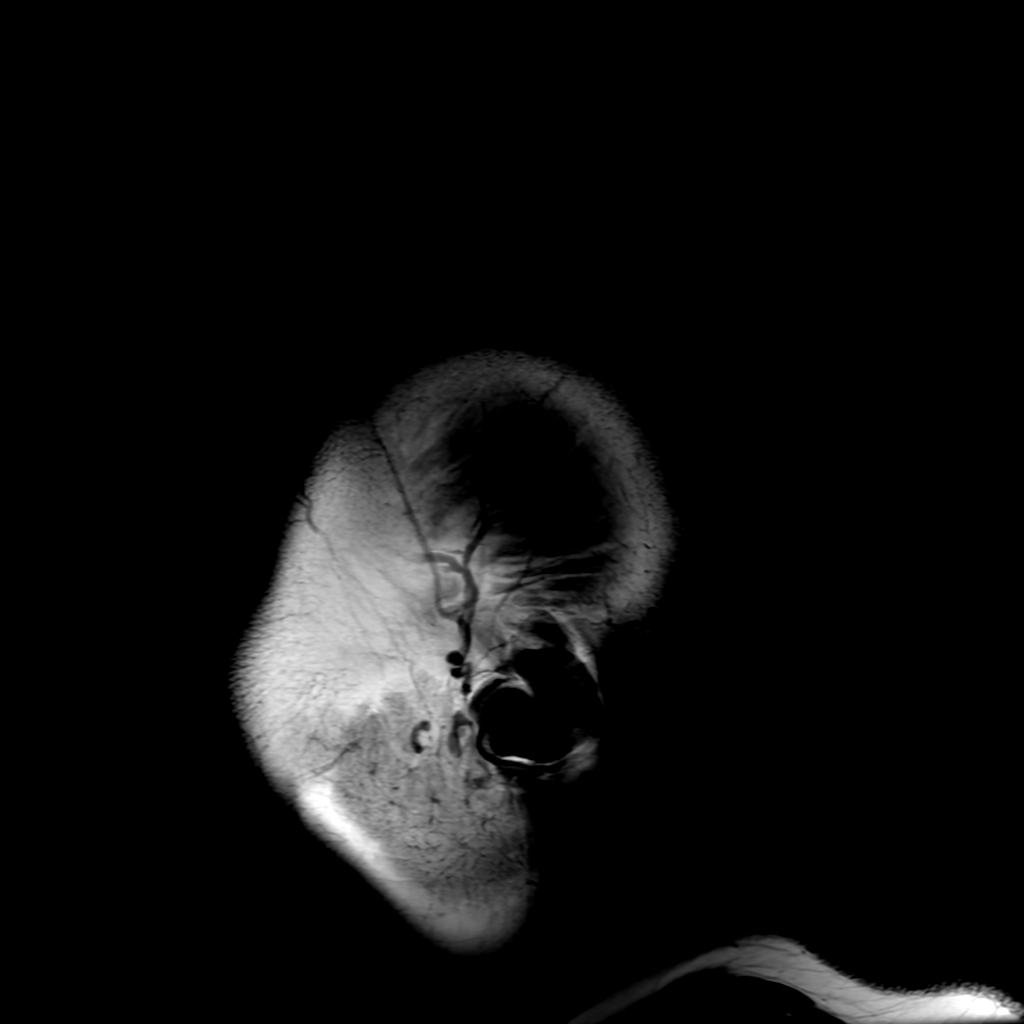

[Series 5: T2 · axial · 5.0mm · 0.23mm/px · 1 of 24 slices shown (1 of 3)]
[im 1/24]
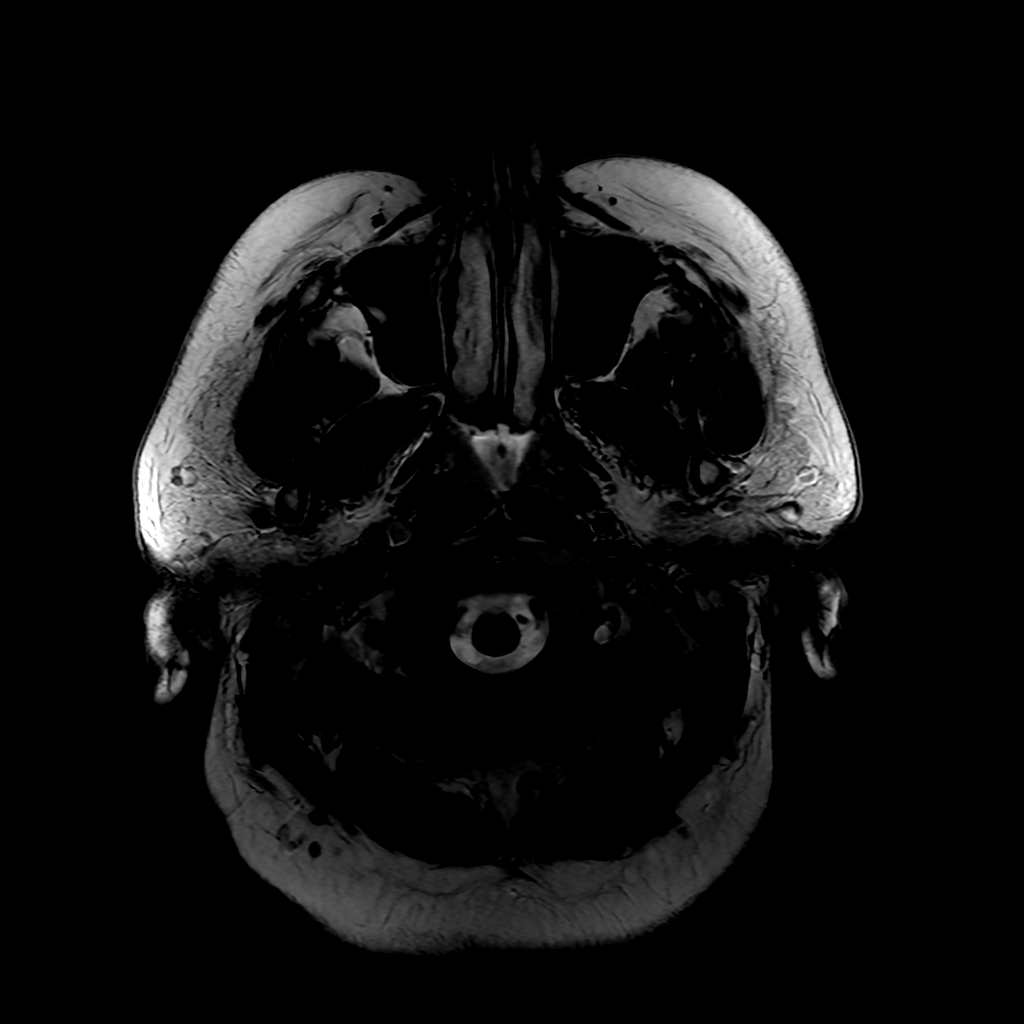

[Series 6: FLAIR · axial · 4.0mm · 0.45mm/px · z∈[-53,+80]mm · 2 of 32 slices shown (2 of 3)]
[im 1/32]
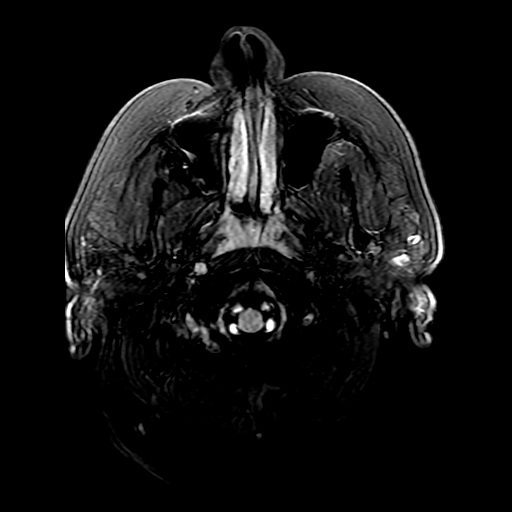
[im 32/32]
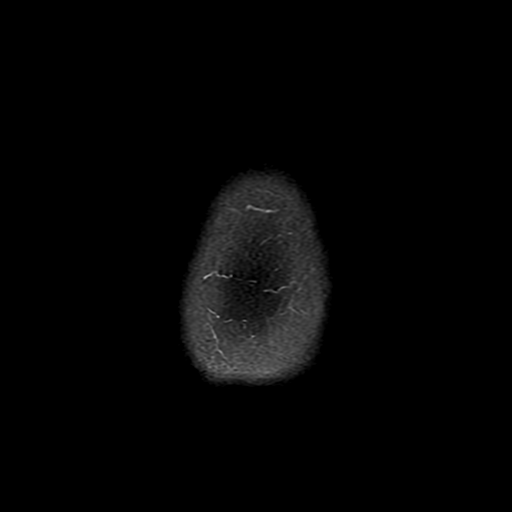

[Series 9: T2 · coronal · 3.0mm · 0.35mm/px · 1 of 28 slices shown (2 of 3)]
[im 1/28]
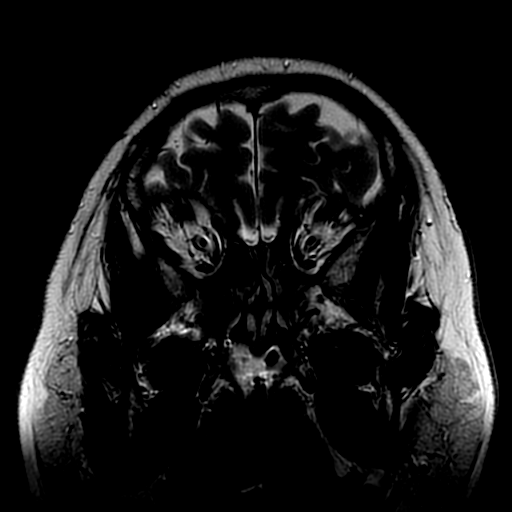

[Series 10: FLAIR · coronal · 3.0mm · 0.35mm/px · 1 of 28 slices shown (3 of 3)]
[im 1/28]
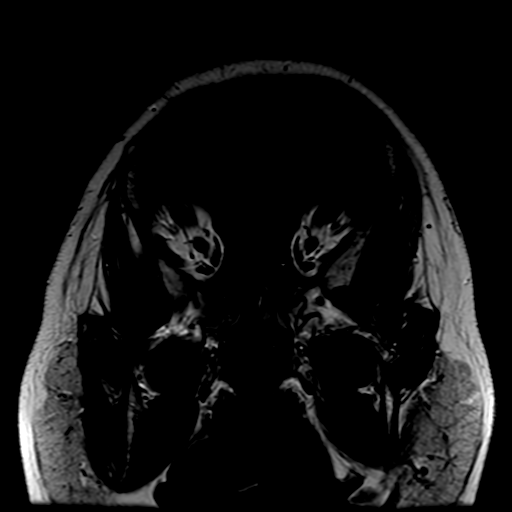

[Series 11: T2 · coronal · 5.0mm · 0.20mm/px · 1 of 27 slices shown (3 of 3)]
[im 1/27]
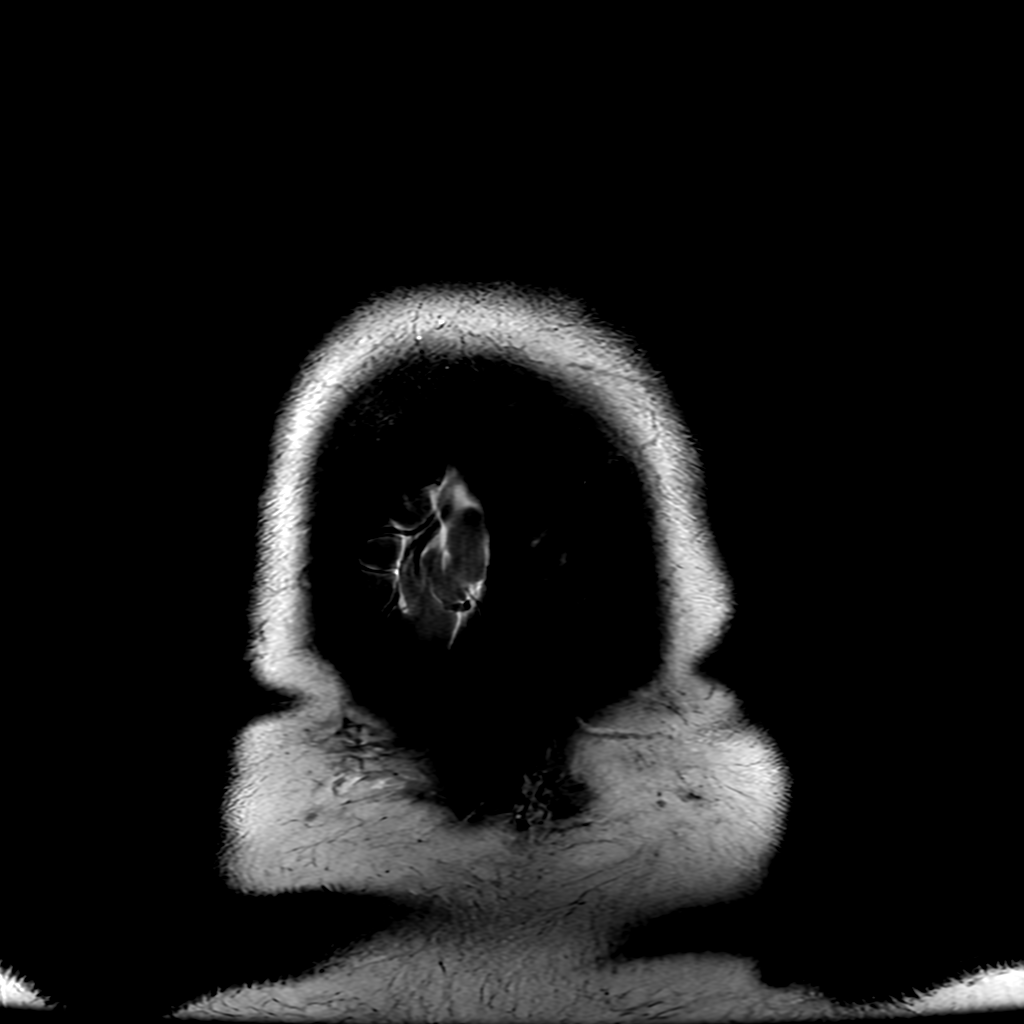

[Series 250: ADC · axial · 3.0mm · 0.94mm/px · z∈[-47,+85]mm · 2 of 46 slices shown (1 of 2)]
[im 1/46]
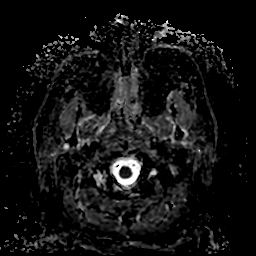
[im 46/46]
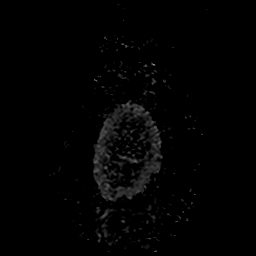

[Series 350: ADC · coronal · 4.0mm · 0.94mm/px · 2 of 33 slices shown (2 of 2)]
[im 1/33]
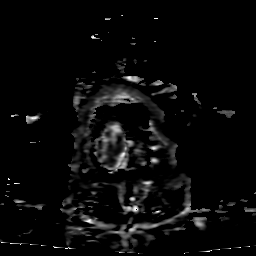
[im 33/33]
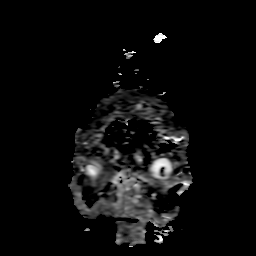

[19 of 48 positions shown; findings below may reference images not displayed]

FINDINGS: Brain:

Cerebral volume is normal for age.

Mild multifocal T2/FLAIR hyperintensity within the cerebral white
matter.

The hippocampi are symmetric in size and signal.

There is no acute infarct.

No evidence of an intracranial mass.

No chronic intracranial blood products.

No extra-axial fluid collection.

No midline shift.

Vascular: Expected proximal arterial flow voids.

Skull and upper cervical spine: No focal marrow lesion.

Sinuses/Orbits: Visualized orbits show no acute finding. No
significant paranasal sinus disease.
IMPRESSION: No evidence of acute intracranial abnormality.

No specific seizure focus is identified.

Mild multifocal T2/FLAIR hyperintensity within the cerebral white
matter, nonspecific but most often secondary to chronic small vessel
ischemia. Potential alternative consideration include sequela of
chronic migraine headaches, sequela of a prior
infectious/inflammatory process or sequela of a demyelinating
process such as multiple sclerosis, among others.

## 2023-01-24 ENCOUNTER — Other Ambulatory Visit (HOSPITAL_COMMUNITY): Payer: Self-pay | Admitting: Psychiatry

## 2023-01-24 DIAGNOSIS — S42301D Unspecified fracture of shaft of humerus, right arm, subsequent encounter for fracture with routine healing: Secondary | ICD-10-CM | POA: Diagnosis not present

## 2023-01-24 DIAGNOSIS — F5101 Primary insomnia: Secondary | ICD-10-CM

## 2023-01-24 DIAGNOSIS — R251 Tremor, unspecified: Secondary | ICD-10-CM

## 2023-01-25 ENCOUNTER — Other Ambulatory Visit: Payer: Self-pay | Admitting: Internal Medicine

## 2023-01-25 DIAGNOSIS — Z1231 Encounter for screening mammogram for malignant neoplasm of breast: Secondary | ICD-10-CM

## 2023-01-29 ENCOUNTER — Other Ambulatory Visit (HOSPITAL_COMMUNITY): Payer: Self-pay

## 2023-01-29 DIAGNOSIS — R251 Tremor, unspecified: Secondary | ICD-10-CM

## 2023-01-29 DIAGNOSIS — F5101 Primary insomnia: Secondary | ICD-10-CM

## 2023-01-29 MED ORDER — HYDROXYZINE HCL 10 MG PO TABS: 10.0000 mg | ORAL_TABLET | Freq: Three times a day (TID) | ORAL | 0 refills | Status: DC | PRN

## 2023-02-04 ENCOUNTER — Telehealth: Payer: Self-pay | Admitting: Obstetrics & Gynecology

## 2023-02-04 NOTE — Telephone Encounter (Signed)
Patient called to get more samples of Veozah. Please advise.

## 2023-02-06 NOTE — Telephone Encounter (Signed)
Pt aware that I do not currently have any samples but will keep her in my inbasket so that I can call her when they come in.

## 2023-02-12 ENCOUNTER — Encounter (HOSPITAL_COMMUNITY): Payer: Self-pay | Admitting: Psychiatry

## 2023-02-12 ENCOUNTER — Telehealth (HOSPITAL_BASED_OUTPATIENT_CLINIC_OR_DEPARTMENT_OTHER): Payer: Medicare Other | Admitting: Psychiatry

## 2023-02-12 VITALS — Wt 237.0 lb

## 2023-02-12 DIAGNOSIS — R251 Tremor, unspecified: Secondary | ICD-10-CM

## 2023-02-12 DIAGNOSIS — F411 Generalized anxiety disorder: Secondary | ICD-10-CM

## 2023-02-12 DIAGNOSIS — F5101 Primary insomnia: Secondary | ICD-10-CM | POA: Diagnosis not present

## 2023-02-12 DIAGNOSIS — F331 Major depressive disorder, recurrent, moderate: Secondary | ICD-10-CM

## 2023-02-12 MED ORDER — ARIPIPRAZOLE 5 MG PO TABS
5.0000 mg | ORAL_TABLET | Freq: Every day | ORAL | 0 refills | Status: DC
Start: 2023-02-12 — End: 2023-05-13

## 2023-02-12 MED ORDER — LORAZEPAM 0.5 MG PO TABS
0.5000 mg | ORAL_TABLET | Freq: Two times a day (BID) | ORAL | 2 refills | Status: DC
Start: 2023-02-12 — End: 2023-05-13

## 2023-02-12 MED ORDER — LAMOTRIGINE 150 MG PO TABS
150.0000 mg | ORAL_TABLET | Freq: Two times a day (BID) | ORAL | 0 refills | Status: DC
Start: 2023-02-12 — End: 2023-05-13

## 2023-02-12 MED ORDER — HYDROXYZINE HCL 10 MG PO TABS
10.0000 mg | ORAL_TABLET | Freq: Two times a day (BID) | ORAL | 2 refills | Status: DC | PRN
Start: 2023-02-12 — End: 2023-05-13

## 2023-02-12 MED ORDER — VENLAFAXINE HCL ER 150 MG PO CP24: 150.0000 mg | ORAL_CAPSULE | Freq: Every day | ORAL | 0 refills | Status: DC
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
## 2023-02-13 DIAGNOSIS — R918 Other nonspecific abnormal finding of lung field: Secondary | ICD-10-CM | POA: Diagnosis not present

## 2023-02-13 DIAGNOSIS — R0602 Shortness of breath: Secondary | ICD-10-CM | POA: Diagnosis not present

## 2023-02-13 DIAGNOSIS — E7800 Pure hypercholesterolemia, unspecified: Secondary | ICD-10-CM | POA: Diagnosis not present

## 2023-02-13 DIAGNOSIS — I10 Essential (primary) hypertension: Secondary | ICD-10-CM | POA: Diagnosis not present

## 2023-02-13 DIAGNOSIS — M7989 Other specified soft tissue disorders: Secondary | ICD-10-CM | POA: Diagnosis not present

## 2023-02-13 DIAGNOSIS — R079 Chest pain, unspecified: Secondary | ICD-10-CM | POA: Diagnosis not present

## 2023-02-13 DIAGNOSIS — R0781 Pleurodynia: Secondary | ICD-10-CM | POA: Diagnosis not present

## 2023-02-13 DIAGNOSIS — W1830XA Fall on same level, unspecified, initial encounter: Secondary | ICD-10-CM | POA: Diagnosis not present

## 2023-02-13 DIAGNOSIS — F17210 Nicotine dependence, cigarettes, uncomplicated: Secondary | ICD-10-CM | POA: Diagnosis not present

## 2023-02-13 DIAGNOSIS — G8918 Other acute postprocedural pain: Secondary | ICD-10-CM | POA: Diagnosis not present

## 2023-02-13 DIAGNOSIS — I2510 Atherosclerotic heart disease of native coronary artery without angina pectoris: Secondary | ICD-10-CM | POA: Diagnosis not present

## 2023-02-13 DIAGNOSIS — R072 Precordial pain: Secondary | ICD-10-CM | POA: Diagnosis not present

## 2023-02-13 DIAGNOSIS — S42331A Displaced oblique fracture of shaft of humerus, right arm, initial encounter for closed fracture: Secondary | ICD-10-CM | POA: Diagnosis not present

## 2023-02-13 DIAGNOSIS — K219 Gastro-esophageal reflux disease without esophagitis: Secondary | ICD-10-CM | POA: Diagnosis not present

## 2023-02-13 DIAGNOSIS — J9811 Atelectasis: Secondary | ICD-10-CM | POA: Diagnosis not present

## 2023-02-13 DIAGNOSIS — S42331K Displaced oblique fracture of shaft of humerus, right arm, subsequent encounter for fracture with nonunion: Secondary | ICD-10-CM | POA: Diagnosis not present

## 2023-02-14 DIAGNOSIS — R918 Other nonspecific abnormal finding of lung field: Secondary | ICD-10-CM | POA: Diagnosis not present

## 2023-02-14 DIAGNOSIS — R0781 Pleurodynia: Secondary | ICD-10-CM | POA: Diagnosis not present

## 2023-02-14 DIAGNOSIS — I2510 Atherosclerotic heart disease of native coronary artery without angina pectoris: Secondary | ICD-10-CM | POA: Diagnosis not present

## 2023-02-14 DIAGNOSIS — M7989 Other specified soft tissue disorders: Secondary | ICD-10-CM | POA: Diagnosis not present

## 2023-02-14 NOTE — Telephone Encounter (Signed)
Samples left at front desk. VM left for patient.

## 2023-02-19 ENCOUNTER — Other Ambulatory Visit (HOSPITAL_COMMUNITY): Payer: Self-pay | Admitting: Psychiatry

## 2023-02-19 DIAGNOSIS — F411 Generalized anxiety disorder: Secondary | ICD-10-CM

## 2023-02-25 ENCOUNTER — Ambulatory Visit: Payer: Medicare Other | Admitting: Obstetrics & Gynecology

## 2023-02-28 DIAGNOSIS — Z4789 Encounter for other orthopedic aftercare: Secondary | ICD-10-CM | POA: Diagnosis not present

## 2023-02-28 DIAGNOSIS — I10 Essential (primary) hypertension: Secondary | ICD-10-CM | POA: Diagnosis not present

## 2023-02-28 DIAGNOSIS — R569 Unspecified convulsions: Secondary | ICD-10-CM | POA: Diagnosis not present

## 2023-02-28 DIAGNOSIS — S42361A Displaced segmental fracture of shaft of humerus, right arm, initial encounter for closed fracture: Secondary | ICD-10-CM | POA: Diagnosis not present

## 2023-02-28 DIAGNOSIS — R0602 Shortness of breath: Secondary | ICD-10-CM | POA: Diagnosis not present

## 2023-02-28 DIAGNOSIS — Z299 Encounter for prophylactic measures, unspecified: Secondary | ICD-10-CM | POA: Diagnosis not present

## 2023-03-04 ENCOUNTER — Ambulatory Visit: Payer: Medicare Other

## 2023-03-07 DIAGNOSIS — H40033 Anatomical narrow angle, bilateral: Secondary | ICD-10-CM | POA: Diagnosis not present

## 2023-03-07 DIAGNOSIS — H04123 Dry eye syndrome of bilateral lacrimal glands: Secondary | ICD-10-CM | POA: Diagnosis not present

## 2023-03-27 DIAGNOSIS — I739 Peripheral vascular disease, unspecified: Secondary | ICD-10-CM | POA: Diagnosis not present

## 2023-03-27 DIAGNOSIS — I10 Essential (primary) hypertension: Secondary | ICD-10-CM | POA: Diagnosis not present

## 2023-03-27 DIAGNOSIS — L02419 Cutaneous abscess of limb, unspecified: Secondary | ICD-10-CM | POA: Diagnosis not present

## 2023-03-27 DIAGNOSIS — Z299 Encounter for prophylactic measures, unspecified: Secondary | ICD-10-CM | POA: Diagnosis not present

## 2023-04-09 DIAGNOSIS — Z4789 Encounter for other orthopedic aftercare: Secondary | ICD-10-CM | POA: Diagnosis not present

## 2023-04-26 DIAGNOSIS — I10 Essential (primary) hypertension: Secondary | ICD-10-CM | POA: Diagnosis not present

## 2023-04-26 DIAGNOSIS — Z299 Encounter for prophylactic measures, unspecified: Secondary | ICD-10-CM | POA: Diagnosis not present

## 2023-04-26 DIAGNOSIS — J069 Acute upper respiratory infection, unspecified: Secondary | ICD-10-CM | POA: Diagnosis not present

## 2023-05-07 ENCOUNTER — Other Ambulatory Visit (HOSPITAL_COMMUNITY): Payer: Self-pay | Admitting: Psychiatry

## 2023-05-07 DIAGNOSIS — F5101 Primary insomnia: Secondary | ICD-10-CM

## 2023-05-07 DIAGNOSIS — R251 Tremor, unspecified: Secondary | ICD-10-CM

## 2023-05-08 ENCOUNTER — Ambulatory Visit: Payer: Medicare Other

## 2023-05-13 ENCOUNTER — Telehealth (HOSPITAL_BASED_OUTPATIENT_CLINIC_OR_DEPARTMENT_OTHER): Payer: Medicare Other | Admitting: Psychiatry

## 2023-05-13 ENCOUNTER — Encounter (HOSPITAL_COMMUNITY): Payer: Self-pay | Admitting: Psychiatry

## 2023-05-13 VITALS — Wt 241.0 lb

## 2023-05-13 DIAGNOSIS — F5101 Primary insomnia: Secondary | ICD-10-CM | POA: Diagnosis not present

## 2023-05-13 DIAGNOSIS — F331 Major depressive disorder, recurrent, moderate: Secondary | ICD-10-CM

## 2023-05-13 DIAGNOSIS — R251 Tremor, unspecified: Secondary | ICD-10-CM | POA: Diagnosis not present

## 2023-05-13 DIAGNOSIS — F411 Generalized anxiety disorder: Secondary | ICD-10-CM | POA: Diagnosis not present

## 2023-05-13 MED ORDER — LORAZEPAM 0.5 MG PO TABS: 0.5000 mg | ORAL_TABLET | Freq: Two times a day (BID) | ORAL | 2 refills | Status: DC

## 2023-05-13 MED ORDER — LAMOTRIGINE 150 MG PO TABS: 150.0000 mg | ORAL_TABLET | Freq: Two times a day (BID) | ORAL | 0 refills | Status: DC

## 2023-05-13 MED ORDER — ARIPIPRAZOLE 5 MG PO TABS: 5.0000 mg | ORAL_TABLET | Freq: Every day | ORAL | 0 refills | Status: DC

## 2023-05-13 MED ORDER — HYDROXYZINE HCL 10 MG PO TABS: 10.0000 mg | ORAL_TABLET | Freq: Two times a day (BID) | ORAL | 2 refills | Status: DC | PRN

## 2023-05-13 MED ORDER — VENLAFAXINE HCL ER 150 MG PO CP24: 150.0000 mg | ORAL_CAPSULE | Freq: Every day | ORAL | 0 refills | Status: DC
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
## 2023-05-15 ENCOUNTER — Telehealth (HOSPITAL_COMMUNITY): Payer: Medicare Other | Admitting: Psychiatry

## 2023-05-22 ENCOUNTER — Ambulatory Visit: Payer: Medicare Other

## 2023-05-30 DIAGNOSIS — M25512 Pain in left shoulder: Secondary | ICD-10-CM | POA: Diagnosis not present

## 2023-05-30 DIAGNOSIS — S42301D Unspecified fracture of shaft of humerus, right arm, subsequent encounter for fracture with routine healing: Secondary | ICD-10-CM | POA: Diagnosis not present

## 2023-06-14 DIAGNOSIS — B029 Zoster without complications: Secondary | ICD-10-CM | POA: Diagnosis not present

## 2023-06-14 DIAGNOSIS — Z299 Encounter for prophylactic measures, unspecified: Secondary | ICD-10-CM | POA: Diagnosis not present

## 2023-06-14 DIAGNOSIS — I10 Essential (primary) hypertension: Secondary | ICD-10-CM | POA: Diagnosis not present

## 2023-06-14 DIAGNOSIS — J302 Other seasonal allergic rhinitis: Secondary | ICD-10-CM | POA: Diagnosis not present

## 2023-06-27 DIAGNOSIS — M25512 Pain in left shoulder: Secondary | ICD-10-CM | POA: Diagnosis not present

## 2023-06-27 DIAGNOSIS — S42301D Unspecified fracture of shaft of humerus, right arm, subsequent encounter for fracture with routine healing: Secondary | ICD-10-CM | POA: Diagnosis not present

## 2023-07-16 NOTE — Progress Notes (Signed)
 COVID Vaccine Completed:  Date of COVID positive in last 90 days:  PCP - Doreen Beam, MD Cardiologist - Will Elberta Fortis, MD LOV 2017 for syncope, no f/u needed  Chest x-ray - CT 02/14/23 [facility] EKG - 02/14/23 [facility] Stress Test -  ECHO -  Cardiac Cath -  Pacemaker/ICD device last checked: Spinal Cord Stimulator:  Bowel Prep -   Sleep Study -  CPAP -   Fasting Blood Sugar -  Checks Blood Sugar _____ times a day  Last dose of GLP1 agonist-  N/A GLP1 instructions:  Hold 7 days before surgery    Last dose of SGLT-2 inhibitors-  N/A SGLT-2 instructions:  Hold 3 days before surgery    Blood Thinner Instructions:  Last dose:   Time: Aspirin Instructions: Last Dose:  Activity level:  Can go up a flight of stairs and perform activities of daily living without stopping and without symptoms of chest pain or shortness of breath.  Able to exercise without symptoms  Unable to go up a flight of stairs without symptoms of     Anesthesia review: atherosclerosis of aorta, HTN , OSA, CP , seizure  Patient denies shortness of breath, fever, cough and chest pain at PAT appointment  Patient verbalized understanding of instructions that were given to them at the PAT appointment. Patient was also instructed that they will need to review over the PAT instructions again at home before surgery.

## 2023-07-16 NOTE — Patient Instructions (Signed)
 SURGICAL WAITING ROOM VISITATION  Patients having surgery or a procedure may have no more than 2 support people in the waiting area - these visitors may rotate.    Children under the age of 69 must have an adult with them who is not the patient.  Due to an increase in RSV and influenza rates and associated hospitalizations, children ages 66 and under may not visit patients in [facility] hospitals.  Visitors with respiratory illnesses are discouraged from visiting and should remain at home.  If the patient needs to stay at the hospital during part of their recovery, the visitor guidelines for inpatient rooms apply. Pre-op nurse will coordinate an appropriate time for 1 support person to accompany patient in pre-op.  This support person may not rotate.    Please refer to the [facility] website for the visitor guidelines for Inpatients (after your surgery is over and you are in a regular room).    Your procedure is scheduled on: 06/29/23   Report to [facility] Main Entrance    Report to admitting at 7:20 AM   Call this number if you have problems the morning of surgery [phone]   Do not eat food :After Midnight.   After Midnight you may have the following liquids until 6:35 AM DAY OF SURGERY  Water Non-Citrus Juices (without pulp, NO RED-Apple, White grape, White cranberry) Black Coffee (NO MILK/CREAM OR CREAMERS, sugar ok)  Clear Tea (NO MILK/CREAM OR CREAMERS, sugar ok) regular and decaf                             Plain Jell-O (NO RED)                                           Fruit ices (not with fruit pulp, NO RED)                                     Popsicles (NO RED)                                                               Sports drinks like Gatorade (NO RED)                 The day of surgery:  Drink ONE (1) Pre-Surgery Clear Ensure at 6:35 AM the morning of surgery. Drink in one sitting. Do not sip.  This drink was given to you during your hospital   pre-op appointment visit. Nothing else to drink after completing the  Pre-Surgery Clear Ensure.          If you have questions, please contact your surgeon's office.   FOLLOW BOWEL PREP AND ANY ADDITIONAL PRE OP INSTRUCTIONS YOU RECEIVED FROM YOUR SURGEON'S OFFICE!!!     Oral Hygiene is also important to reduce your risk of infection.                                    Remember -  BRUSH YOUR TEETH THE MORNING OF SURGERY WITH YOUR REGULAR TOOTHPASTE  DENTURES WILL BE REMOVED PRIOR TO SURGERY PLEASE DO NOT APPLY "Poly grip" OR ADHESIVES!!!   Do NOT smoke after Midnight   Stop all vitamins and herbal supplements 7 days before surgery.   Take these medicines the morning of surgery with A SIP OF WATER: Abilify, Lamictal, Xyzal, Ativan, Pantoprazole, Rosuvastatin, Venlafaxine   DO NOT TAKE ANY ORAL DIABETIC MEDICATIONS DAY OF YOUR SURGERY  Bring CPAP mask and tubing day of surgery.                              You may not have any metal on your body including hair pins, jewelry, and body piercing             Do not wear make-up, lotions, powders, perfumes, or deodorant  Do not wear nail polish including gel and S&S, artificial/acrylic nails, or any other type of covering on natural nails including finger and toenails. If you have artificial nails, gel coating, etc. that needs to be removed by a nail salon please have this removed prior to surgery or surgery may need to be canceled/ delayed if the surgeon/ anesthesia feels like they are unable to be safely monitored.   Do not shave  48 hours prior to surgery.    Do not bring valuables to the hospital. [facility] IS NOT             RESPONSIBLE   FOR VALUABLES.   Contacts, glasses, dentures or bridgework may not be worn into surgery.  DO NOT BRING YOUR HOME MEDICATIONS TO THE HOSPITAL. PHARMACY WILL DISPENSE MEDICATIONS LISTED ON YOUR MEDICATION LIST TO YOU DURING YOUR ADMISSION IN THE HOSPITAL!    Patients discharged on the day of  surgery will not be allowed to drive home.  Someone NEEDS to stay with you for the first 24 hours after anesthesia.   Special Instructions: Bring a copy of your healthcare power of attorney and living will documents the day of surgery if you haven't scanned them before.              Please read over the following fact sheets you were given: IF YOU HAVE QUESTIONS ABOUT YOUR PRE-OP INSTRUCTIONS PLEASE CALL [phone] Contras    If you received a COVID test during your pre-op visit  it is requested that you wear a mask when out in public, stay away from anyone that may not be feeling well and notify your surgeon if you develop symptoms. If you test positive for Covid or have been in contact with anyone that has tested positive in the last 10 days please notify you surgeon.    SURGICAL WAITING ROOM VISITATION  Patients having surgery or a procedure may have no more than 2 support people in the waiting area - these visitors may rotate.    Children under the age of 78 must have an adult with them who is not the patient.  Due to an increase in RSV and influenza rates and associated hospitalizations, children ages 56 and under may not visit patients in [facility] hospitals.  Visitors with respiratory illnesses are discouraged from visiting and should remain at home.  If the patient needs to stay at the hospital during part of their recovery, the visitor guidelines for inpatient rooms apply. Pre-op nurse will coordinate an appropriate time for 1 support person to accompany patient in pre-op.  This support person may not rotate.    Please refer to the [facility] website for the visitor guidelines for Inpatients (after your surgery is over and you are in a regular room).       Your procedure is scheduled on:    Report to [facility] Main Entrance    Report to admitting at AM   Call this number if you have problems the morning of surgery [phone]   Do not eat food :After  Midnight.   After Midnight you may have the following liquids until ______ AM/ PM DAY OF SURGERY  Water Non-Citrus Juices (without pulp, NO RED-Apple, White grape, White cranberry) Black Coffee (NO MILK/CREAM OR CREAMERS, sugar ok)  Clear Tea (NO MILK/CREAM OR CREAMERS, sugar ok) regular and decaf                             Plain Jell-O (NO RED)                                           Fruit ices (not with fruit pulp, NO RED)                                     Popsicles (NO RED)                                                               Sports drinks like Gatorade (NO RED)              Drink 2 Ensure/G2 drinks AT 10:00 PM the night before surgery.        The day of surgery:  Drink ONE (1) Pre-Surgery Clear Ensure or G2 at AM the morning of surgery. Drink in one sitting. Do not sip.  This drink was given to you during your hospital  pre-op appointment visit. Nothing else to drink after completing the  Pre-Surgery Clear Ensure or G2.          If you have questions, please contact your surgeon's office.   FOLLOW BOWEL PREP AND ANY ADDITIONAL PRE OP INSTRUCTIONS YOU RECEIVED FROM YOUR SURGEON'S OFFICE!!!     Oral Hygiene is also important to reduce your risk of infection.                                    Remember - BRUSH YOUR TEETH THE MORNING OF SURGERY WITH YOUR REGULAR TOOTHPASTE  DENTURES WILL BE REMOVED PRIOR TO SURGERY PLEASE DO NOT APPLY "Poly grip" OR ADHESIVES!!!   Do NOT smoke after Midnight   Stop all vitamins and herbal supplements 7 days before surgery.   Take these medicines the morning of surgery with A SIP OF WATER:   DO NOT TAKE ANY ORAL DIABETIC MEDICATIONS DAY OF YOUR SURGERY  Bring CPAP mask and tubing day of surgery.  You may not have any metal on your body including hair pins, jewelry, and body piercing             Do not wear make-up, lotions, powders, perfumes/cologne, or deodorant  Do not wear nail polish  including gel and S&S, artificial/acrylic nails, or any other type of covering on natural nails including finger and toenails. If you have artificial nails, gel coating, etc. that needs to be removed by a nail salon please have this removed prior to surgery or surgery may need to be canceled/ delayed if the surgeon/ anesthesia feels like they are unable to be safely monitored.   Do not shave  48 hours prior to surgery.               Men may shave face and neck.   Do not bring valuables to the hospital. [facility] IS NOT             RESPONSIBLE   FOR VALUABLES.   Contacts, glasses, dentures or bridgework may not be worn into surgery.   Bring small overnight bag day of surgery.   DO NOT BRING YOUR HOME MEDICATIONS TO THE HOSPITAL. PHARMACY WILL DISPENSE MEDICATIONS LISTED ON YOUR MEDICATION LIST TO YOU DURING YOUR ADMISSION IN THE HOSPITAL!    Patients discharged on the day of surgery will not be allowed to drive home.  Someone NEEDS to stay with you for the first 24 hours after anesthesia.   Special Instructions: Bring a copy of your healthcare power of attorney and living will documents the day of surgery if you haven't scanned them before.              Please read over the following fact sheets you were given: IF YOU HAVE QUESTIONS ABOUT YOUR PRE-OP INSTRUCTIONS PLEASE CALL [phone]   If you received a COVID test during your pre-op visit  it is requested that you wear a mask when out in public, stay away from anyone that may not be feeling well and notify your surgeon if you develop symptoms. If you test positive for Covid or have been in contact with anyone that has tested positive in the last 10 days please notify you surgeon.

## 2023-07-17 ENCOUNTER — Encounter (HOSPITAL_COMMUNITY)
Admission: RE | Admit: 2023-07-17 | Discharge: 2023-07-17 | Disposition: A | Discharge status: Home / Self Care | Source: Ambulatory Visit | Attending: Orthopedic Surgery | Admitting: Orthopedic Surgery

## 2023-07-17 ENCOUNTER — Other Ambulatory Visit: Payer: Self-pay

## 2023-07-17 ENCOUNTER — Encounter (HOSPITAL_COMMUNITY): Payer: Self-pay

## 2023-07-17 VITALS — BP 120/60 | HR 77 | Temp 98.3°F | Resp 16 | Ht 68.0 in | Wt 235.0 lb

## 2023-07-17 DIAGNOSIS — F419 Anxiety disorder, unspecified: Secondary | ICD-10-CM | POA: Diagnosis not present

## 2023-07-17 DIAGNOSIS — I10 Essential (primary) hypertension: Secondary | ICD-10-CM | POA: Insufficient documentation

## 2023-07-17 DIAGNOSIS — S42301K Unspecified fracture of shaft of humerus, right arm, subsequent encounter for fracture with nonunion: Secondary | ICD-10-CM | POA: Diagnosis not present

## 2023-07-17 DIAGNOSIS — Z01812 Encounter for preprocedural laboratory examination: Secondary | ICD-10-CM | POA: Diagnosis not present

## 2023-07-17 DIAGNOSIS — F17210 Nicotine dependence, cigarettes, uncomplicated: Secondary | ICD-10-CM | POA: Insufficient documentation

## 2023-07-17 DIAGNOSIS — X58XXXD Exposure to other specified factors, subsequent encounter: Secondary | ICD-10-CM | POA: Diagnosis not present

## 2023-07-17 HISTORY — DX: Unspecified convulsions: R56.9

## 2023-07-17 HISTORY — DX: Unspecified osteoarthritis, unspecified site: M19.90

## 2023-07-17 LAB — BASIC METABOLIC PANEL
Anion gap: 10 (ref 5–15)
BUN: 10 mg/dL (ref 6–20)
CO2: 25 mmol/L (ref 22–32)
Calcium: 9.4 mg/dL (ref 8.9–10.3)
Chloride: 102 mmol/L (ref 98–111)
Creatinine, Ser: 0.69 mg/dL (ref 0.44–1.00)
GFR, Estimated: >60 mL/min (ref >60)
Glucose, Bld: 107 mg/dL — ABNORMAL HIGH (ref 70–99)
Potassium: 4.3 mmol/L (ref 3.5–5.1)
Sodium: 137 mmol/L (ref 135–145)

## 2023-07-17 LAB — CBC
HCT: 43 % (ref 36.0–46.0)
Hemoglobin: 13.5 g/dL (ref 12.0–15.0)
MCH: 28.9 pg (ref 26.0–34.0)
MCHC: 31.4 g/dL (ref 30.0–36.0)
MCV: 92.1 fL (ref 80.0–100.0)
Platelets: 250 10*3/uL (ref 150–400)
RBC: 4.67 MIL/uL (ref 3.87–5.11)
RDW: 15.5 % (ref 11.5–15.5)
WBC: 8 10*3/uL (ref 4.0–10.5)
nRBC: 0 % (ref 0.0–0.2)

## 2023-07-18 NOTE — Anesthesia Preprocedure Evaluation (Signed)
 Anesthesia Evaluation  Patient identified by MRN, date of birth, ID band Patient awake    Reviewed: Allergy & Precautions, NPO status , Patient's Chart, lab work & pertinent test results  History of Anesthesia Complications Negative for: history of anesthetic complications  Airway Mallampati: III  TM Distance: >3 FB Neck ROM: Full   Comment: Previous grade I view with glidescope 3, easy mask Dental  (+) Dental Advisory Given   Pulmonary neg shortness of breath, sleep apnea , neg COPD, neg recent URI, Current Smoker and Patient abstained from smoking.   Pulmonary exam normal breath sounds clear to auscultation       Cardiovascular hypertension (valsartan-HCTZ), Pt. on medications (-) angina (-) Past MI, (-) Cardiac Stents and (-) CABG (-) dysrhythmias  Rhythm:Regular Rate:Normal  HLD   Neuro/Psych  Headaches, Seizures - (non-epileptic seizure-like activity due to anxiety, last occurred 2 weeks ago),  PSYCHIATRIC DISORDERS (panic attacks) Anxiety Depression       GI/Hepatic Neg liver ROS,GERD  Medicated,,  Endo/Other  negative endocrine ROS    Renal/GU negative Renal ROS     Musculoskeletal  (+) Arthritis ,    Abdominal  (+) + obese  Peds  Hematology negative hematology ROS (+) Lab Results      Component                Value               Date                      WBC                      8.0                 07/17/2023                HGB                      13.5                07/17/2023                HCT                      43.0                07/17/2023                MCV                      92.1                07/17/2023                PLT                      250                 07/17/2023              Anesthesia Other Findings   Reproductive/Obstetrics                             Anesthesia Physical Anesthesia Plan  ASA: 3  Anesthesia Plan: General   Post-op Pain Management:  Tylenol PO (pre-op)* and Regional block*   Induction:  Intravenous  PONV Risk Score and Plan: 2 and Ondansetron, Dexamethasone and Treatment may vary due to age or medical condition  Airway Management Planned: Oral ETT and Video Laryngoscope Planned  Additional Equipment:   Intra-op Plan:   Post-operative Plan: Extubation in OR  Informed Consent: I have reviewed the patients History and Physical, chart, labs and discussed the procedure including the risks, benefits and alternatives for the proposed anesthesia with the patient or authorized representative who has indicated his/her understanding and acceptance.     Dental advisory given  Plan Discussed with: CRNA and Anesthesiologist  Anesthesia Plan Comments: (Discussed potential risks of nerve blocks including, but not limited to, infection, bleeding, nerve damage, seizures, pneumothorax, respiratory depression, and potential failure of the block. Alternatives to nerve blocks discussed. All questions answered.  Risks of general anesthesia discussed including, but not limited to, sore throat, hoarse voice, chipped/damaged teeth, injury to vocal cords, nausea and vomiting, allergic reactions, lung infection, heart attack, stroke, and death. All questions answered. )       Anesthesia Quick Evaluation

## 2023-07-18 NOTE — Progress Notes (Signed)
 Anesthesia Chart Review   Case: [id number] Date/Time: 07/19/23 1478   Procedures:      OPEN REDUCTION INTERNAL FIXATION (ORIF) PROXIMAL HUMERUS FRACTURE (Right)     PROCEDURE, BONE GRAFT (Right) - with autograft with right femur   Anesthesia type: Choice   Diagnosis: Open fracture of shaft of right humerus, sequela [S42.301S]   Pre-op diagnosis: Right humerus non union   Location: [facility] ROOM 08 / [facility]   Surgeons: Yolonda Kida, MD       DISCUSSION:53 y.o. smoker with h/o HTN, sleep apnea, anxiety, depression, right humerus nonunion scheduled for above procedure 3/28/225 with Dr. Duwayne Heck.   Pt with h/o psychogenic non-epileptic events, described as episodes of shaking or zoning out. Normal EEG, no acute findings on MRI. Follows with psychiatry.  Last seen 05/13/2023. Currently on Abilify, Lamictal, Effexor.  Takes Atarax for tremor and insomnia.   VS: BP 120/60   Pulse 77   Temp 36.8 C   Resp 16   Ht 5\' 8"  (1.727 m)   Wt 106.6 kg   SpO2 97%   BMI 35.73 kg/m   PROVIDERS: Ignatius Specking, MD is PCP   Gunnar Fusi, MD is Psychiatrist  LABS: Labs reviewed: Acceptable for surgery. (all labs ordered are listed, but only abnormal results are displayed)  Labs Reviewed  BASIC METABOLIC PANEL - Abnormal; Notable for the following components:      Result Value   Glucose, Bld 107 (*)    All other components within normal limits  CBC     IMAGES:   EKG:   CV:  Past Medical History:  Diagnosis Date   Anxiety    Arthritis    Depression    GERD (gastroesophageal reflux disease)    Headache(784.0)    Hernia    Hyperlipemia    Hypertension    Panic attack    Seizure (HCC)    Sleep apnea     Past Surgical History:  Procedure Laterality Date   ABDOMINAL HYSTERECTOMY     APPENDECTOMY     arm surgery Right    rod put in   COLONOSCOPY  2022   CORONARY ANGIOPLASTY     CYSTECTOMY  09/2020   rt breast   MOUTH SURGERY     MYRINGOTOMY WITH TUBE PLACEMENT Right  09/30/2018   Procedure: MYRINGOTOMY WITH RIGHT TUBE PLACEMENT;  Surgeon: Newman Pies, MD;  Location: [facility];  Service: ENT;  Laterality: Right;   RADIOLOGY WITH ANESTHESIA N/A 11/15/2020   Procedure: MRI WITH ANESTHESIA OF BRAIN WITHOUT CONTRAST;  Surgeon: Radiologist, Medication, MD;  Location: [facility];  Service: Radiology;  Laterality: N/A;    MEDICATIONS:  ARIPiprazole (ABILIFY) 5 MG tablet   Biotin 5000 MCG TABS   Cholecalciferol (VITAMIN D3) 50 MCG (2000 UT) capsule   docusate sodium (STOOL SOFTENER) 100 MG capsule   Fezolinetant (VEOZAH) 45 MG TABS   hydrOXYzine (ATARAX) 10 MG tablet   lamoTRIgine (LAMICTAL) 150 MG tablet   levocetirizine (XYZAL) 5 MG tablet   LORazepam (ATIVAN) 0.5 MG tablet   Melatonin 10 MG TABS   Menthol, Topical Analgesic, (BIOFREEZE) 10 % LIQD   pantoprazole (PROTONIX) 40 MG tablet   rosuvastatin (CRESTOR) 20 MG tablet   solifenacin (VESICARE) 10 MG tablet   valACYclovir (VALTREX) 1000 MG tablet   valsartan-hydrochlorothiazide (DIOVAN-HCT) 160-12.5 MG tablet   venlafaxine XR (EFFEXOR-XR) 150 MG 24 hr capsule   No current facility-administered medications for this encounter.      Shanda Bumps  Rian Koon Ward, PA-C [facility] [phone]

## 2023-07-19 ENCOUNTER — Other Ambulatory Visit: Payer: Self-pay

## 2023-07-19 ENCOUNTER — Ambulatory Visit (HOSPITAL_COMMUNITY)

## 2023-07-19 ENCOUNTER — Encounter (HOSPITAL_COMMUNITY)
Admission: RE | Disposition: A | Discharge status: Home / Self Care | Payer: Self-pay | Source: Home / Self Care | Attending: Orthopedic Surgery

## 2023-07-19 ENCOUNTER — Ambulatory Visit (HOSPITAL_COMMUNITY)
Admission: RE | Admit: 2023-07-19 | Discharge: 2023-07-19 | Disposition: A | Discharge status: Home / Self Care | Attending: Orthopedic Surgery | Admitting: Orthopedic Surgery

## 2023-07-19 ENCOUNTER — Ambulatory Visit (HOSPITAL_COMMUNITY): Admitting: Medical

## 2023-07-19 ENCOUNTER — Ambulatory Visit (HOSPITAL_BASED_OUTPATIENT_CLINIC_OR_DEPARTMENT_OTHER): Admitting: Anesthesiology

## 2023-07-19 ENCOUNTER — Encounter (HOSPITAL_COMMUNITY): Payer: Self-pay | Admitting: Orthopedic Surgery

## 2023-07-19 DIAGNOSIS — R569 Unspecified convulsions: Secondary | ICD-10-CM | POA: Insufficient documentation

## 2023-07-19 DIAGNOSIS — S42301K Unspecified fracture of shaft of humerus, right arm, subsequent encounter for fracture with nonunion: Secondary | ICD-10-CM | POA: Diagnosis not present

## 2023-07-19 DIAGNOSIS — S42391K Other fracture of shaft of right humerus, subsequent encounter for fracture with nonunion: Secondary | ICD-10-CM | POA: Diagnosis not present

## 2023-07-19 DIAGNOSIS — Z5986 Financial insecurity: Secondary | ICD-10-CM | POA: Insufficient documentation

## 2023-07-19 DIAGNOSIS — M1990 Unspecified osteoarthritis, unspecified site: Secondary | ICD-10-CM | POA: Diagnosis not present

## 2023-07-19 DIAGNOSIS — F418 Other specified anxiety disorders: Secondary | ICD-10-CM

## 2023-07-19 DIAGNOSIS — X58XXXA Exposure to other specified factors, initial encounter: Secondary | ICD-10-CM | POA: Insufficient documentation

## 2023-07-19 DIAGNOSIS — S42301S Unspecified fracture of shaft of humerus, right arm, sequela: Secondary | ICD-10-CM | POA: Diagnosis present

## 2023-07-19 DIAGNOSIS — F32A Depression, unspecified: Secondary | ICD-10-CM | POA: Diagnosis not present

## 2023-07-19 DIAGNOSIS — I10 Essential (primary) hypertension: Secondary | ICD-10-CM | POA: Insufficient documentation

## 2023-07-19 DIAGNOSIS — S42301A Unspecified fracture of shaft of humerus, right arm, initial encounter for closed fracture: Secondary | ICD-10-CM | POA: Diagnosis not present

## 2023-07-19 DIAGNOSIS — F419 Anxiety disorder, unspecified: Secondary | ICD-10-CM | POA: Diagnosis not present

## 2023-07-19 DIAGNOSIS — K219 Gastro-esophageal reflux disease without esophagitis: Secondary | ICD-10-CM | POA: Insufficient documentation

## 2023-07-19 DIAGNOSIS — F17210 Nicotine dependence, cigarettes, uncomplicated: Secondary | ICD-10-CM | POA: Insufficient documentation

## 2023-07-19 DIAGNOSIS — G8918 Other acute postprocedural pain: Secondary | ICD-10-CM | POA: Diagnosis not present

## 2023-07-19 DIAGNOSIS — E785 Hyperlipidemia, unspecified: Secondary | ICD-10-CM | POA: Diagnosis not present

## 2023-07-19 DIAGNOSIS — E669 Obesity, unspecified: Secondary | ICD-10-CM

## 2023-07-19 HISTORY — PX: ORIF HUMERUS FRACTURE: SHX2126

## 2023-07-19 HISTORY — PX: HARVEST BONE GRAFT: SHX377

## 2023-07-19 SURGERY — OPEN REDUCTION INTERNAL FIXATION (ORIF) PROXIMAL HUMERUS FRACTURE
Anesthesia: General | Site: Leg Upper | Laterality: Right

## 2023-07-19 MED ORDER — ONDANSETRON HCL 4 MG/2ML IJ SOLN
INTRAMUSCULAR | Status: AC
  Filled 2023-07-19: qty 2

## 2023-07-19 MED ORDER — BUPIVACAINE HCL (PF) 0.25 % IJ SOLN
INTRAMUSCULAR | Status: AC
  Filled 2023-07-19: qty 30

## 2023-07-19 MED ORDER — DEXAMETHASONE SODIUM PHOSPHATE 10 MG/ML IJ SOLN
INTRAMUSCULAR | Status: AC
  Filled 2023-07-19: qty 1

## 2023-07-19 MED ORDER — ONDANSETRON 4 MG PO TBDP: 4.0000 mg | ORAL_TABLET | Freq: Three times a day (TID) | ORAL | 0 refills | Status: AC | PRN

## 2023-07-19 MED ORDER — EPHEDRINE SULFATE-NACL 50-0.9 MG/10ML-% IV SOSY
PREFILLED_SYRINGE | INTRAVENOUS | Status: DC | PRN
  Administered 2023-07-19: 5 mg via INTRAVENOUS
  Administered 2023-07-19 (×2): 10 mg via INTRAVENOUS

## 2023-07-19 MED ORDER — OXYCODONE-ACETAMINOPHEN 7.5-325 MG PO TABS: 1.0000 | ORAL_TABLET | ORAL | 0 refills | Status: AC | PRN

## 2023-07-19 MED ORDER — FENTANYL CITRATE (PF) 100 MCG/2ML IJ SOLN
INTRAMUSCULAR | Status: AC
  Filled 2023-07-19: qty 2

## 2023-07-19 MED ORDER — FENTANYL CITRATE (PF) 100 MCG/2ML IJ SOLN
INTRAMUSCULAR | Status: DC | PRN
  Administered 2023-07-19: 25 ug via INTRAVENOUS
  Administered 2023-07-19: 50 ug via INTRAVENOUS

## 2023-07-19 MED ORDER — BUPIVACAINE HCL (PF) 0.25 % IJ SOLN
INTRAMUSCULAR | Status: DC | PRN
  Administered 2023-07-19: 30 mL

## 2023-07-19 MED ORDER — ACETAMINOPHEN 500 MG PO TABS
1000.0000 mg | ORAL_TABLET | Freq: Once | ORAL | Status: AC
  Administered 2023-07-19: 1000 mg via ORAL
  Filled 2023-07-19: qty 2

## 2023-07-19 MED ORDER — CHLORHEXIDINE GLUCONATE 0.12 % MT SOLN
15.0000 mL | Freq: Once | OROMUCOSAL | Status: AC
  Administered 2023-07-19: 15 mL via OROMUCOSAL

## 2023-07-19 MED ORDER — BUPIVACAINE HCL (PF) 0.5 % IJ SOLN
INTRAMUSCULAR | Status: DC | PRN
  Administered 2023-07-19: 10 mL via PERINEURAL

## 2023-07-19 MED ORDER — ALBUMIN HUMAN 5 % IV SOLN: INTRAVENOUS | Status: DC | PRN

## 2023-07-19 MED ORDER — VARENICLINE TARTRATE 1 MG PO TABS
1.0000 mg | ORAL_TABLET | Freq: Two times a day (BID) | ORAL | 0 refills | Status: AC
Start: 2023-07-19 — End: ?

## 2023-07-19 MED ORDER — HYDROMORPHONE HCL 1 MG/ML IJ SOLN: 0.2500 mg | INTRAMUSCULAR | Status: DC | PRN

## 2023-07-19 MED ORDER — LACTATED RINGERS IV SOLN: INTRAVENOUS | Status: DC

## 2023-07-19 MED ORDER — TRANEXAMIC ACID-NACL 1000-0.7 MG/100ML-% IV SOLN
1000.0000 mg | INTRAVENOUS | Status: AC
  Administered 2023-07-19: 1000 mg via INTRAVENOUS
  Filled 2023-07-19: qty 100

## 2023-07-19 MED ORDER — FENTANYL CITRATE PF 50 MCG/ML IJ SOSY: 50.0000 ug | PREFILLED_SYRINGE | INTRAMUSCULAR | Status: DC

## 2023-07-19 MED ORDER — SUGAMMADEX SODIUM 200 MG/2ML IV SOLN
INTRAVENOUS | Status: DC | PRN
  Administered 2023-07-19: 200 mg via INTRAVENOUS

## 2023-07-19 MED ORDER — ORAL CARE MOUTH RINSE: 15.0000 mL | Freq: Once | OROMUCOSAL | Status: AC

## 2023-07-19 MED ORDER — ONDANSETRON HCL 4 MG/2ML IJ SOLN
INTRAMUSCULAR | Status: DC | PRN
  Administered 2023-07-19: 4 mg via INTRAVENOUS

## 2023-07-19 MED ORDER — BUPIVACAINE HCL (PF) 0.5 % IJ SOLN: INTRAMUSCULAR | Status: DC | PRN

## 2023-07-19 MED ORDER — PHENYLEPHRINE HCL-NACL 20-0.9 MG/250ML-% IV SOLN
INTRAVENOUS | Status: DC | PRN
  Administered 2023-07-19: 25 ug/min via INTRAVENOUS

## 2023-07-19 MED ORDER — ESMOLOL HCL 100 MG/10ML IV SOLN
INTRAVENOUS | Status: DC | PRN
  Administered 2023-07-19: 30 mg via INTRAVENOUS

## 2023-07-19 MED ORDER — GLYCOPYRROLATE 0.2 MG/ML IJ SOLN
INTRAMUSCULAR | Status: DC | PRN
  Administered 2023-07-19 (×2): .1 mg via INTRAVENOUS

## 2023-07-19 MED ORDER — PHENYLEPHRINE 80 MCG/ML (10ML) SYRINGE FOR IV PUSH (FOR BLOOD PRESSURE SUPPORT)
PREFILLED_SYRINGE | INTRAVENOUS | Status: DC | PRN
  Administered 2023-07-19: 160 ug via INTRAVENOUS

## 2023-07-19 MED ORDER — DEXAMETHASONE SODIUM PHOSPHATE 10 MG/ML IJ SOLN
INTRAMUSCULAR | Status: DC | PRN
  Administered 2023-07-19: 10 mg via INTRAVENOUS

## 2023-07-19 MED ORDER — BUPIVACAINE LIPOSOME 1.3 % IJ SUSP
INTRAMUSCULAR | Status: DC | PRN
  Administered 2023-07-19: 10 mL via PERINEURAL

## 2023-07-19 MED ORDER — CEFAZOLIN SODIUM-DEXTROSE 2-4 GM/100ML-% IV SOLN
2.0000 g | INTRAVENOUS | Status: AC
  Administered 2023-07-19: 2 g via INTRAVENOUS
  Filled 2023-07-19: qty 100

## 2023-07-19 MED ORDER — ROCURONIUM BROMIDE 100 MG/10ML IV SOLN
INTRAVENOUS | Status: DC | PRN
  Administered 2023-07-19: 10 mg via INTRAVENOUS
  Administered 2023-07-19: 70 mg via INTRAVENOUS
  Administered 2023-07-19: 10 mg via INTRAVENOUS

## 2023-07-19 MED ORDER — AMISULPRIDE (ANTIEMETIC) 5 MG/2ML IV SOLN: 10.0000 mg | Freq: Once | INTRAVENOUS | Status: DC | PRN

## 2023-07-19 MED ORDER — PROPOFOL 10 MG/ML IV BOLUS
INTRAVENOUS | Status: AC
  Filled 2023-07-19: qty 20

## 2023-07-19 MED ORDER — VANCOMYCIN HCL 1000 MG IV SOLR
INTRAVENOUS | Status: AC
  Filled 2023-07-19: qty 20

## 2023-07-19 MED ORDER — PROPOFOL 10 MG/ML IV BOLUS
INTRAVENOUS | Status: DC | PRN
  Administered 2023-07-19: 30 mg via INTRAVENOUS
  Administered 2023-07-19: 200 mg via INTRAVENOUS

## 2023-07-19 MED ORDER — CEFAZOLIN SODIUM 1 G IJ SOLR
INTRAMUSCULAR | Status: AC
  Filled 2023-07-19: qty 20

## 2023-07-19 MED ORDER — SODIUM CHLORIDE 0.9 % IR SOLN
Status: DC | PRN
  Administered 2023-07-19: 3000 mL

## 2023-07-19 MED ORDER — MIDAZOLAM HCL 2 MG/2ML IJ SOLN
1.0000 mg | INTRAMUSCULAR | Status: DC
  Administered 2023-07-19: 2 mg via INTRAVENOUS
  Filled 2023-07-19: qty 2

## 2023-07-19 MED ORDER — 0.9 % SODIUM CHLORIDE (POUR BTL) OPTIME
TOPICAL | Status: DC | PRN
Start: 2023-07-19 — End: 2023-07-19
  Administered 2023-07-19: 1000 mL

## 2023-07-19 SURGICAL SUPPLY — 58 items
BAG COUNTER SPONGE SURGICOUNT (BAG) IMPLANT
BAG ZIPLOCK 12X15 (MISCELLANEOUS) ×2 IMPLANT
BIT DRILL 190X12XCANN LRG QC (BIT) IMPLANT
BIT DRILL 2.5 X LONG (BIT) ×2 IMPLANT
BIT DRILL X LONG 2.5 (BIT) IMPLANT
BIT DRL 190X12XCANN LRG QC (BIT) ×2 IMPLANT
BONE CHIP PRESERV 5CC PCAN5 (Bone Implant) ×2 IMPLANT
CLSR STERI-STRIP ANTIMIC 1/2X4 (GAUZE/BANDAGES/DRESSINGS) IMPLANT
COVER SURGICAL LIGHT HANDLE (MISCELLANEOUS) ×2 IMPLANT
DRAPE C-ARM 42X120 X-RAY (DRAPES) ×2 IMPLANT
DRAPE U-SHAPE 47X51 STRL (DRAPES) ×2 IMPLANT
DRSG ADAPTIC 3X8 NADH LF (GAUZE/BANDAGES/DRESSINGS) ×2 IMPLANT
DRSG AQUACEL AG ADV 3.5X 6 (GAUZE/BANDAGES/DRESSINGS) IMPLANT
DRSG AQUACEL AG ADV 3.5X14 (GAUZE/BANDAGES/DRESSINGS) IMPLANT
DURAPREP 26ML APPLICATOR (WOUND CARE) ×2 IMPLANT
ELECT REM PT RETURN 15FT ADLT (MISCELLANEOUS) ×2 IMPLANT
GAUZE PAD ABD 8X10 STRL (GAUZE/BANDAGES/DRESSINGS) ×2 IMPLANT
GAUZE SPONGE 4X4 12PLY STRL (GAUZE/BANDAGES/DRESSINGS) ×2 IMPLANT
GLOVE BIO SURGEON STRL SZ7.5 (GLOVE) ×4 IMPLANT
GLOVE BIOGEL M 7.0 STRL (GLOVE) IMPLANT
GLOVE BIOGEL PI IND STRL 7.5 (GLOVE) ×2 IMPLANT
GLOVE BIOGEL PI IND STRL 8 (GLOVE) ×4 IMPLANT
GLOVE BIOGEL PI IND STRL 8.5 (GLOVE) ×2 IMPLANT
GLOVE SURG ORTHO 8.0 STRL STRW (GLOVE) ×2 IMPLANT
GOWN STRL REUS W/ TWL XL LVL3 (GOWN DISPOSABLE) ×4 IMPLANT
GRAFT BNE CANC CHIPS 1-8 5CC (Bone Implant) IMPLANT
GUIDEWIRE 3.2X400 (WIRE) IMPLANT
KIT BASIN OR (CUSTOM PROCEDURE TRAY) ×2 IMPLANT
KIT BONE HARVEST RIA 2 (ORTHOPEDIC DISPOSABLE SUPPLIES) IMPLANT
KIT TURNOVER KIT A (KITS) IMPLANT
MANIFOLD NEPTUNE II (INSTRUMENTS) ×2 IMPLANT
NDL TAPERED W/ NITINOL LOOP (MISCELLANEOUS) ×2 IMPLANT
NEEDLE TAPERED W/ NITINOL LOOP (MISCELLANEOUS) ×2 IMPLANT
NS IRRIG 1000ML POUR BTL (IV SOLUTION) ×2 IMPLANT
PACK SHOULDER (CUSTOM PROCEDURE TRAY) ×2 IMPLANT
PROS LCP PLATE 10 137M (Plate) ×2 IMPLANT
PROSTHESIS LCP PLATE 10 137M (Plate) IMPLANT
PROTECTOR NERVE ULNAR (MISCELLANEOUS) ×2 IMPLANT
REAMER HEAD RIA 2 11.5 (ORTHOPEDIC DISPOSABLE SUPPLIES) IMPLANT
REAMER ROD DEEP FLUTE 2.5X950 (INSTRUMENTS) IMPLANT
SCREW LOCK CORT ST 3.5X24 (Screw) IMPLANT
SCREW LOCK CORT ST 3.5X28 (Screw) IMPLANT
SCREW LOCK CORT ST 3.5X30 (Screw) IMPLANT
SCREW LOCK CORT ST 3.5X34 (Screw) IMPLANT
SCREW LOCK CORT ST 3.5X36 (Screw) IMPLANT
SLING ARM IMMOBILIZER LRG (SOFTGOODS) IMPLANT
STRIP CLOSURE SKIN 1/2X4 (GAUZE/BANDAGES/DRESSINGS) ×2 IMPLANT
SUT FIBERWIRE #2 38 T-5 BLUE (SUTURE) IMPLANT
SUT MNCRL AB 3-0 PS2 18 (SUTURE) ×2 IMPLANT
SUT MON AB 2-0 CT1 36 (SUTURE) IMPLANT
SUT MON AB 3-0 SH27 (SUTURE) IMPLANT
SUT VIC AB 0 CT1 36 (SUTURE) IMPLANT
SUT VIC AB 1 CT1 27XBRD ANBCTR (SUTURE) IMPLANT
SUT VIC AB 2-0 CT1 TAPERPNT 27 (SUTURE) ×2 IMPLANT
SUTURE FIBERWR #2 38 T-5 BLUE (SUTURE) IMPLANT
TOWEL OR 17X26 10 PK STRL BLUE (TOWEL DISPOSABLE) ×2 IMPLANT
UNDERPAD 30X36 HEAVY ABSORB (UNDERPADS AND DIAPERS) ×2 IMPLANT
WATER STERILE IRR 1000ML POUR (IV SOLUTION) ×2 IMPLANT

## 2023-07-19 NOTE — Anesthesia Postprocedure Evaluation (Signed)
 Anesthesia Post Note  Patient: [name]  Procedure(s) Performed: OPEN REDUCTION INTERNAL FIXATION (ORIF) PROXIMAL HUMERUS FRACTURE (Right: Arm Lower) PROCEDURE, BONE GRAFT (Right: Leg Upper)     Patient location during evaluation: PACU Anesthesia Type: General Level of consciousness: awake Pain management: pain level controlled Vital Signs Assessment: post-procedure vital signs reviewed and stable Respiratory status: spontaneous breathing, nonlabored ventilation and respiratory function stable Cardiovascular status: blood pressure returned to baseline and stable Postop Assessment: no apparent nausea or vomiting Anesthetic complications: no Comments: Patient SpO2 hovering 90-92% in PACU. Patient denies SOB or trouble breathing. When asked to take a deep breath for lung auscultation, her SpO2 went up to 97% on RA. Lung sounds equal and CTAB. Instructed patient to continue using incentive spirometer at home. Instructed to return to ED if she did develop any SOB or trouble breathing.    No notable events documented.  Last Vitals:  Vitals:   07/19/23 1330 07/19/23 1345  BP: (!) 132/55 130/64  Pulse: 84 92  Resp: 13 19  Temp:    SpO2: 94% 91%    Last Pain:  Vitals:   07/19/23 1245  PainSc: 0-No pain                 Linton Rump

## 2023-07-19 NOTE — Discharge Instructions (Addendum)
 Orthopedic surgery discharge instructions:  -Maintain postoperative bandages until your follow-up appointment.  You may begin showering however 2 days, but do not submerge underwater.  -Maintain your arm in sling at all times.  You should only remove for showering and getting dressed.  No lifting with the operative arm.  -For mild to moderate pain use Tylenol and Advil in alternating fashion around-the-clock.  For breakthrough pain use oxycodone as necessary.  -Please apply ice to the right shoulder for 20-30 minutes out of each hour that you are awake.  Do this around-the-clock for the first 3 days from surgery.  -Follow-up in 2 weeks for routine postoperative check.  -For the right knee he may begin immediate weightbearing as tolerated.  He may range the knee as tolerated with no restrictions on range of motion. -Postoperative bandage on the right knee can also be maintained until your follow-up appointment in 2 weeks.  You may shower with this in place.  Please do not submerge underwater. -For the prevention of DVT should take an 81 mg aspirin once per day x 6 weeks.

## 2023-07-19 NOTE — Op Note (Signed)
 Date of Surgery: 07/19/2023  INDICATIONS: Hailey Owen is a 53 y.o.-year-old female with a right midshaft humerus hypertrophic nonunion;  The patient did consent to the procedure after discussion of the risks and benefits.  PREOPERATIVE DIAGNOSIS:  1.  Right humeral shaft hypertrophic nonunion  POSTOPERATIVE DIAGNOSIS: Same.  PROCEDURE:  1.  Right femur intramedullary autograft harvest with reamed irrigated aspiration device 2.  Right humeral shaft malunion debridement with open reduction internal fixation  SURGEON: Maryan Rued, M.D.  ASSIST: Dion Saucier, PA-C  Assistant attestation:  PA Mcclung present for the entire procedure.  Present for prepping draping, bone graft harvest, instrumentation of the humerus as well as complex closure..  ANESTHESIA:  general, interscalene  IV FLUIDS AND URINE: See anesthesia.  ESTIMATED BLOOD LOSS: 200 mL.  IMPLANTS:  Synthes ria (reamed irrigation and aspiration) intramedullary bone graft harvester Synthes 3.5 mm locking compression plate 10 hole with 9 cortical 3 5 screws  5 cc cancellous bone allograft chips.  DRAINS: None  COMPLICATIONS: None.  DESCRIPTION OF PROCEDURE: The patient was brought to the operating room and placed supine on the operating table.  The patient had been signed prior to the procedure and this was documented. The patient had the anesthesia placed by the anesthesiologist.  A time-out was performed to confirm that this was the correct patient, site, side and location. The patient did receive antibiotics prior to the incision and was re-dosed during the procedure as needed at indicated intervals.  A tourniquet was not placed.  The patient had the operative extremity prepped and draped in the standard surgical fashion.     We began the procedure with the retrograde harvest of intramedullary bone from the right femur.  We utilized the Synthes reamed irrigation aspiration device.  We initiated the procedure by opening up  with the anterior aspect of the knee just inferior to the patellar inferior pole.  Dissection was carried through skin subcutaneous tissue to the level of the peritenon.  Peritenon flaps were created with a deep knife.  We then sharply incised in line with fibers the patella tendon.  We then accessed the intercondylar notch.  AP and lateral fluoroscopic images were utilized to determine that we are in the perfect position just along the anterior aspect of blooming stats line.  We entered the canal with the guidepin.  This was again confirmed on AP and lateral.  We then opened with the opening reamer.  Guide rod was placed and we reamed and aspirated while irrigating the right femoral canal obtaining ample autograft.  We next irrigated the knee wound.  We closed in layers with 2-0 Vicryl for the patella tendon, 2-0 Vicryl for subcutaneous tissue and subcuticular 3-0 Monocryl for skin.  Steri-Strips and sterile bandage were applied.   Next, we turned our attention to the humerus.  Anterolateral approach to the humerus was established.  Dissection was carried down through skin and subcutaneous tissue to the level of the muscular fascia.  We bluntly separated the biceps off of the brachialis.  We then used a Cobb elevator to enter the fracture nonunion.  There was significant hypertrophic nonunion on the distal fragment.  This created a contour change along the anterior lateral cortex consistent with hypertrophic bone.  However, there was no bridging callus appreciated.  We then meticulously took down the fibrous tissue interposed between the fracture sites.  We then further used the rondure and Cobb to elicit good bleeding bone on both ends.  Next, we picked out  a 10 hole 3.5 mm LCP plate.  We lined this up with direct visualization and placed screws on either side of the fracture.  We checked this on fluoroscopic intraoperative images.  We were happy with the position of the plate and the reduction.  We then  placed 4 more holes proximal and 3 more holes distal.  This created excellent compression across the fracture site.  Lastly, we added 5 cc of cancellous bone chips with our autograft and packed this into the nonunion site.  We irrigated again copiously with normal saline.  We then closed the muscle fascia and placed a gram of vancomycin powder in the subcutaneous tissue.  Subcutaneous fat was closed with 0 Vicryl, 2-0 Monocryl for deep dermal layer and staples for skin.  A standard sterile dressing was applied and the arm was placed in a sling.  All counts were correct x 2.  She was awakened from general anesthetic in stable condition.  She was transported to PACU.  POSTOPERATIVE PLAN:  Hailey Owen will discharge home today from PACU.  She will be immediately weightbearing as tolerated and range of motion as tolerated to the right knee.  No restrictions there.  For the right arm she will remain in her sling for 2 full weeks.  She can use the arm in the sling for activities of daily living such as eating and using a computer.  However no lifting with the right arm.  We will allow some early range of motion of the elbow and shoulder starting at 2 weeks but nothing above shoulder height for 6 weeks.  We will limit external and internal rotation for 6 weeks as well.  I will see her in the office in 2 weeks.  Will get 2 views of the right humerus at that appointment.

## 2023-07-19 NOTE — Anesthesia Procedure Notes (Signed)
 Procedure Name: Intubation Date/Time: 07/19/2023 9:17 AM  Performed by: Linton Rump, MDPre-anesthesia Checklist: Patient identified, Emergency Drugs available, Suction available, Patient being monitored and Timeout performed Patient Re-evaluated:Patient Re-evaluated prior to induction Oxygen Delivery Method: Circle system utilized Preoxygenation: Pre-oxygenation with 100% oxygen Induction Type: IV induction Ventilation: Two handed mask ventilation required Laryngoscope Size: Glidescope and 3 Grade View: Grade I Tube type: Oral Tube size: 7.5 mm Number of attempts: 1 Airway Equipment and Method: Patient positioned with wedge pillow, Rigid stylet, Oral airway and Video-laryngoscopy Placement Confirmation: ETT inserted through vocal cords under direct vision, positive ETCO2 and breath sounds checked- equal and bilateral Secured at: 23 cm Tube secured with: Tape Dental Injury: Teeth and Oropharynx as per pre-operative assessment

## 2023-07-19 NOTE — H&P (Signed)
 ORTHOPAEDIC H and P  REQUESTING PHYSICIAN: Yolonda Kida, MD  PCP:  Ignatius Specking, MD  Chief Complaint: Right humeral nonunion  HPI: Hailey Owen is a 53 y.o. female who complains of right arm pain and instability through a chronic canal nonunion site.  Here today for bone grafting and open reduction internal fixation.  Past Medical History:  Diagnosis Date   Anxiety    Arthritis    Depression    GERD (gastroesophageal reflux disease)    Headache(784.0)    Hernia    Hyperlipemia    Hypertension    Panic attack    Seizure (HCC)    Sleep apnea    Past Surgical History:  Procedure Laterality Date   ABDOMINAL HYSTERECTOMY     APPENDECTOMY     arm surgery Right    rod put in   COLONOSCOPY  2022   CORONARY ANGIOPLASTY     CYSTECTOMY  09/2020   rt breast   MOUTH SURGERY     MYRINGOTOMY WITH TUBE PLACEMENT Right 09/30/2018   Procedure: MYRINGOTOMY WITH RIGHT TUBE PLACEMENT;  Surgeon: Newman Pies, MD;  Location: [facility];  Service: ENT;  Laterality: Right;   RADIOLOGY WITH ANESTHESIA N/A 11/15/2020   Procedure: MRI WITH ANESTHESIA OF BRAIN WITHOUT CONTRAST;  Surgeon: Radiologist, Medication, MD;  Location: [facility];  Service: Radiology;  Laterality: N/A;   Social History   Socioeconomic History   Marital status: Divorced    Spouse name: Not on file   Number of children: 0   Years of education: College   Highest education level: Not on file  Occupational History   Occupation: Secondary school teacher    Employer: [employer]  Tobacco Use   Smoking status: Every Day    Current packs/day: 1.00    Average packs/day: 1 pack/day for 30.0 years (30.0 ttl pk-yrs)    Types: Cigarettes   Smokeless tobacco: Never  Vaping Use   Vaping status: Former  Substance and Sexual Activity   Alcohol use: Not Currently   Drug use: No   Sexual activity: Not Currently    Birth control/protection: Surgical    Comment: hysterectomy  Other Topics Concern   Not on file   Social History Narrative   Patient lives at home with mother.   Caffeine Use:1 cup of coffee daily; 3 sodas weekly   Right-handed   Social Drivers of Health   Financial Resource Strain: Medium Risk (12/25/2022)   Overall Financial Resource Strain (CARDIA)    Difficulty of Paying Living Expenses: Somewhat hard  Food Insecurity: Food Insecurity Present (12/25/2022)   Hunger Vital Sign    Worried About Running Out of Food in the Last Year: Never true    Ran Out of Food in the Last Year: Sometimes true  Transportation Needs: No Transportation Needs (12/25/2022)   PRAPARE - Administrator, Civil Service (Medical): No    Lack of Transportation (Non-Medical): No  Physical Activity: Inactive (12/25/2022)   Exercise Vital Sign    Days of Exercise per Week: 0 days    Minutes of Exercise per Session: 0 min  Stress: Stress Concern Present (12/25/2022)   [employer] of Occupational Health - Occupational Stress Questionnaire    Feeling of Stress : To some extent  Social Connections: Moderately Isolated (12/25/2022)   Social Connection and Isolation Panel [NHANES]    Frequency of Communication with Friends and Family: More than three times a week    Frequency of  Social Gatherings with Friends and Family: More than three times a week    Attends Religious Services: More than 4 times per year    Active Member of [employer] or Organizations: No    Attends Engineer, structural: Never    Marital Status: Divorced   Family History  Problem Relation Age of Onset   Atrial fibrillation Mother    Alcohol abuse Father    Depression Father    Seizures Father    Depression Sister    Anxiety disorder Sister    Bipolar disorder Sister    Breast cancer Neg Hx    Allergies  Allergen Reactions   Sulfa Antibiotics Other (See Comments)    REACTION: "Shut down immune system with nausea and vomiting, dehydration" was given for bladder infection, resulted in hospitalization   Morphine     Other  Reaction(s): Other (See Comments)  "Makes my body jerk real bad"   Oxycodone     Other Reaction(s): Not available, Other (See Comments)   Septra [Sulfamethoxazole-Trimethoprim]    Prior to Admission medications   Medication Sig Start Date End Date Taking? Authorizing Provider  ARIPiprazole (ABILIFY) 5 MG tablet Take 1 tablet (5 mg total) by mouth daily. 05/13/23  Yes Arfeen, Phillips Grout, MD  Biotin 5000 MCG TABS Take 5,000 mcg by mouth daily.   Yes [provider]  Cholecalciferol (VITAMIN D3) 50 MCG (2000 UT) capsule Take 2,000 Units by mouth daily.   Yes [provider]  docusate sodium (STOOL SOFTENER) 100 MG capsule Take 100 mg by mouth daily as needed for mild constipation or moderate constipation.   Yes [provider]  hydrOXYzine (ATARAX) 10 MG tablet Take 1 tablet (10 mg total) by mouth 2 (two) times daily as needed. Patient taking differently: Take 10 mg by mouth 2 (two) times daily as needed for anxiety. 05/13/23  Yes Arfeen, Phillips Grout, MD  lamoTRIgine (LAMICTAL) 150 MG tablet Take 1 tablet (150 mg total) by mouth 2 (two) times daily. 05/13/23  Yes Arfeen, Phillips Grout, MD  levocetirizine (XYZAL) 5 MG tablet Take 5 mg by mouth daily. 07/09/23  Yes [provider]  LORazepam (ATIVAN) 0.5 MG tablet Take 1 tablet (0.5 mg total) by mouth in the morning and at bedtime. 05/13/23  Yes Arfeen, Phillips Grout, MD  Menthol, Topical Analgesic, (BIOFREEZE) 10 % LIQD Apply 1 application topically daily as needed (pain).   Yes [provider]  pantoprazole (PROTONIX) 40 MG tablet Take 40 mg by mouth daily. 11/02/20  Yes [provider]  rosuvastatin (CRESTOR) 20 MG tablet Take 20 mg by mouth daily. 07/21/19  Yes [provider]  solifenacin (VESICARE) 10 MG tablet 1 daily at bedtime 12/25/22  Yes Lazaro Arms, MD  valACYclovir (VALTREX) 1000 MG tablet Take 1,000 mg by mouth 3 (three) times daily. 06/14/23  Yes [provider]   valsartan-hydrochlorothiazide (DIOVAN-HCT) 160-12.5 MG tablet Take 1 tablet by mouth daily.   Yes [provider]  venlafaxine XR (EFFEXOR-XR) 150 MG 24 hr capsule Take 1 capsule (150 mg total) by mouth daily. 05/13/23 08/11/23 Yes Arfeen, Phillips Grout, MD  Fezolinetant (VEOZAH) 45 MG TABS Take 1 tablet (45 mg total) by mouth at bedtime. Patient not taking: Reported on 07/10/2023 12/25/22   Lazaro Arms, MD  Melatonin 10 MG TABS Take 10 mg by mouth at bedtime. Patient not taking: Reported on 07/10/2023 08/16/22   Cleotis Nipper, MD   No results found.  Positive ROS: All other  systems have been reviewed and were otherwise negative with the exception of those mentioned in the HPI and as above.  Physical Exam: General: Alert, no acute distress Cardiovascular: No pedal edema Respiratory: No cyanosis, no use of accessory musculature GI: No organomegaly, abdomen is soft and non-tender Skin: No lesions in the area of chief complaint Neurologic: Sensation intact distally Psychiatric: Patient is competent for consent with normal mood and affect Lymphatic: No axillary or cervical lymphadenopathy  MUSCULOSKELETAL: Right upper extremity is warm and well-perfused.  Previous surgical scar about the shoulder in's noted.  No signs of infection there.  Mobility noted through the fracture site.  Distally neurovascular intact.  Assessment: Right humeral shaft nonunion  Plan: Plan proceed today with open reduction internal fixation of humeral nonunion as well as bone grafting utilizing the reamed irrigator aspirator from the right femur.  The risks, benefits, and alternatives were discussed with the patient. There are risks associated with the surgery including, but not limited to, problems with anesthesia (death), infection, differences in leg length/angulation/rotation, fracture of bones, loosening or failure of implants, malunion, nonunion, hematoma (blood accumulation) which may require surgical  drainage, blood clots, pulmonary embolism, nerve injury (foot drop), and blood vessel injury. The patient understands these risks and elects to proceed.  Plan be to discharge home postop with a sling to the right arm and should be weightbearing as tolerated to the right leg.     Yolonda Kida, MD Cell [phone]    07/19/2023 7:24 AM

## 2023-07-19 NOTE — Brief Op Note (Signed)
 07/19/2023  11:31 AM  PATIENT:  Chrystie Nose  53 y.o. female  PRE-OPERATIVE DIAGNOSIS:  Right humerus non-union  POST-OPERATIVE DIAGNOSIS:  Right humerus non union  PROCEDURE:  Procedure(s) with comments: OPEN REDUCTION INTERNAL FIXATION (ORIF) PROXIMAL HUMERUS FRACTURE (Right) PROCEDURE, BONE GRAFT (Right) - with autograft with right femur  SURGEON:  Surgeons and Role:    * Yolonda Kida, MD - Primary  PHYSICIAN ASSISTANT: Dion Saucier, PA-C  ANESTHESIA:   regional and general  EBL:  300 mL   BLOOD ADMINISTERED:none  DRAINS: none   LOCAL MEDICATIONS USED:  NONE  SPECIMEN:  No Specimen  DISPOSITION OF SPECIMEN:  N/A  COUNTS:  YES  TOURNIQUET:  * No tourniquets in log *  DICTATION: .Note written in EPIC  PLAN OF CARE: Discharge to home after PACU  PATIENT DISPOSITION:  PACU - hemodynamically stable.   Delay start of Pharmacological VTE agent (>24hrs) due to surgical blood loss or risk of bleeding: not applicable

## 2023-07-19 NOTE — Transfer of Care (Signed)
 Immediate Anesthesia Transfer of Care Note  Patient: Hailey Owen  Procedure(s) Performed: OPEN REDUCTION INTERNAL FIXATION (ORIF) PROXIMAL HUMERUS FRACTURE (Right: Arm Lower) PROCEDURE, BONE GRAFT (Right: Leg Upper)  Patient Location: PACU  Anesthesia Type:General and Regional  Level of Consciousness: awake and drowsy  Airway & Oxygen Therapy: Patient Spontanous Breathing and Patient connected to face mask oxygen  Post-op Assessment: Report given to RN and Post -op Vital signs reviewed and stable  Post vital signs: Reviewed and stable  Last Vitals:  Vitals Value Taken Time  BP 110/54 07/19/23 1145  Temp 36.8 C 07/19/23 1145  Pulse 86 07/19/23 1149  Resp 20 07/19/23 1149  SpO2 88 % 07/19/23 1149  Vitals shown include unfiled device data.  Last Pain:  Vitals:   07/19/23 0817  PainSc: 0-No pain         Complications: No notable events documented.

## 2023-07-19 NOTE — Anesthesia Procedure Notes (Signed)
 Anesthesia Regional Block: Interscalene brachial plexus block   Pre-Anesthetic Checklist: , timeout performed,  Correct Patient, Correct Site, Correct Laterality,  Correct Procedure, Correct Position, site marked,  Risks and benefits discussed,  Surgical consent,  Pre-op evaluation,  At surgeon's request and post-op pain management  Laterality: Right  Prep: chloraprep       Needles:  Injection technique: Single-shot  Needle Type: Echogenic Stimulator Needle     Needle Length: 9cm  Needle Gauge: 21     Additional Needles:   Procedures:,,,, ultrasound used (permanent image in chart),,    Narrative:  Start time: 07/19/2023 8:32 AM End time: 07/19/2023 8:35 AM Injection made incrementally with aspirations every 5 mL.  Performed by: Personally  Anesthesiologist: Linton Rump, MD  Additional Notes: Discussed risks and benefits of nerve block including, but not limited to, prolonged and/or permanent nerve injury involving sensory and/or motor function. Monitors were applied and a time-out was performed. The nerve and associated structures were visualized under ultrasound guidance. After negative aspiration, local anesthetic was slowly injected around the nerve. There was no evidence of high pressure during the procedure. There were no paresthesias. VSS remained stable and the patient tolerated the procedure well.

## 2023-07-23 ENCOUNTER — Encounter (HOSPITAL_COMMUNITY): Payer: Self-pay | Admitting: Orthopedic Surgery

## 2023-08-05 ENCOUNTER — Other Ambulatory Visit (HOSPITAL_COMMUNITY): Payer: Self-pay | Admitting: Psychiatry

## 2023-08-05 DIAGNOSIS — F5101 Primary insomnia: Secondary | ICD-10-CM

## 2023-08-05 DIAGNOSIS — R251 Tremor, unspecified: Secondary | ICD-10-CM

## 2023-08-08 DIAGNOSIS — Z4789 Encounter for other orthopedic aftercare: Secondary | ICD-10-CM | POA: Diagnosis not present

## 2023-08-10 DIAGNOSIS — J209 Acute bronchitis, unspecified: Secondary | ICD-10-CM | POA: Diagnosis not present

## 2023-08-10 DIAGNOSIS — R030 Elevated blood-pressure reading, without diagnosis of hypertension: Secondary | ICD-10-CM | POA: Diagnosis not present

## 2023-08-12 ENCOUNTER — Encounter (HOSPITAL_COMMUNITY): Payer: Self-pay | Admitting: Psychiatry

## 2023-08-12 ENCOUNTER — Telehealth (HOSPITAL_BASED_OUTPATIENT_CLINIC_OR_DEPARTMENT_OTHER): Payer: Medicare Other | Admitting: Psychiatry

## 2023-08-12 DIAGNOSIS — R251 Tremor, unspecified: Secondary | ICD-10-CM | POA: Diagnosis not present

## 2023-08-12 DIAGNOSIS — F411 Generalized anxiety disorder: Secondary | ICD-10-CM

## 2023-08-12 DIAGNOSIS — F331 Major depressive disorder, recurrent, moderate: Secondary | ICD-10-CM | POA: Diagnosis not present

## 2023-08-12 DIAGNOSIS — F5101 Primary insomnia: Secondary | ICD-10-CM | POA: Diagnosis not present

## 2023-08-12 MED ORDER — ARIPIPRAZOLE 5 MG PO TABS: 5.0000 mg | ORAL_TABLET | Freq: Every day | ORAL | 0 refills | Status: DC

## 2023-08-12 MED ORDER — VENLAFAXINE HCL ER 150 MG PO CP24: 150.0000 mg | ORAL_CAPSULE | Freq: Every day | ORAL | 0 refills | Status: DC

## 2023-08-12 MED ORDER — LAMOTRIGINE 150 MG PO TABS: 150.0000 mg | ORAL_TABLET | Freq: Two times a day (BID) | ORAL | 0 refills | Status: DC

## 2023-08-12 MED ORDER — HYDROXYZINE HCL 10 MG PO TABS: 10.0000 mg | ORAL_TABLET | Freq: Two times a day (BID) | ORAL | 2 refills | Status: DC | PRN

## 2023-08-12 MED ORDER — LORAZEPAM 0.5 MG PO TABS: 0.5000 mg | ORAL_TABLET | Freq: Two times a day (BID) | ORAL | 2 refills | Status: DC
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
## 2023-08-16 DIAGNOSIS — J209 Acute bronchitis, unspecified: Secondary | ICD-10-CM | POA: Diagnosis not present

## 2023-08-16 DIAGNOSIS — R0981 Nasal congestion: Secondary | ICD-10-CM | POA: Diagnosis not present

## 2023-08-16 DIAGNOSIS — Z299 Encounter for prophylactic measures, unspecified: Secondary | ICD-10-CM | POA: Diagnosis not present

## 2023-08-16 DIAGNOSIS — R569 Unspecified convulsions: Secondary | ICD-10-CM | POA: Diagnosis not present

## 2023-08-20 DIAGNOSIS — I700 Atherosclerosis of aorta: Secondary | ICD-10-CM | POA: Diagnosis not present

## 2023-08-20 DIAGNOSIS — Z0000 Encounter for general adult medical examination without abnormal findings: Secondary | ICD-10-CM | POA: Diagnosis not present

## 2023-08-20 DIAGNOSIS — Z299 Encounter for prophylactic measures, unspecified: Secondary | ICD-10-CM | POA: Diagnosis not present

## 2023-08-20 DIAGNOSIS — I10 Essential (primary) hypertension: Secondary | ICD-10-CM | POA: Diagnosis not present

## 2023-08-20 DIAGNOSIS — Z713 Dietary counseling and surveillance: Secondary | ICD-10-CM | POA: Diagnosis not present

## 2023-08-20 DIAGNOSIS — I739 Peripheral vascular disease, unspecified: Secondary | ICD-10-CM | POA: Diagnosis not present

## 2023-08-21 DIAGNOSIS — R5383 Other fatigue: Secondary | ICD-10-CM | POA: Diagnosis not present

## 2023-08-21 DIAGNOSIS — E7800 Pure hypercholesterolemia, unspecified: Secondary | ICD-10-CM | POA: Diagnosis not present

## 2023-08-21 DIAGNOSIS — Z79899 Other long term (current) drug therapy: Secondary | ICD-10-CM | POA: Diagnosis not present

## 2023-08-21 DIAGNOSIS — E559 Vitamin D deficiency, unspecified: Secondary | ICD-10-CM | POA: Diagnosis not present

## 2023-09-05 DIAGNOSIS — Z4789 Encounter for other orthopedic aftercare: Secondary | ICD-10-CM | POA: Diagnosis not present

## 2023-10-01 DIAGNOSIS — M25511 Pain in right shoulder: Secondary | ICD-10-CM | POA: Diagnosis not present

## 2023-10-08 DIAGNOSIS — Z299 Encounter for prophylactic measures, unspecified: Secondary | ICD-10-CM | POA: Diagnosis not present

## 2023-10-08 DIAGNOSIS — M47816 Spondylosis without myelopathy or radiculopathy, lumbar region: Secondary | ICD-10-CM | POA: Diagnosis not present

## 2023-10-08 DIAGNOSIS — R52 Pain, unspecified: Secondary | ICD-10-CM | POA: Diagnosis not present

## 2023-10-08 DIAGNOSIS — M4316 Spondylolisthesis, lumbar region: Secondary | ICD-10-CM | POA: Diagnosis not present

## 2023-10-08 DIAGNOSIS — M5416 Radiculopathy, lumbar region: Secondary | ICD-10-CM | POA: Diagnosis not present

## 2023-10-08 DIAGNOSIS — I10 Essential (primary) hypertension: Secondary | ICD-10-CM | POA: Diagnosis not present

## 2023-10-08 DIAGNOSIS — M5126 Other intervertebral disc displacement, lumbar region: Secondary | ICD-10-CM | POA: Diagnosis not present

## 2023-10-08 DIAGNOSIS — M51361 Other intervertebral disc degeneration, lumbar region with lower extremity pain only: Secondary | ICD-10-CM | POA: Diagnosis not present

## 2023-11-02 ENCOUNTER — Other Ambulatory Visit (HOSPITAL_COMMUNITY): Payer: Self-pay | Admitting: Psychiatry

## 2023-11-02 DIAGNOSIS — R251 Tremor, unspecified: Secondary | ICD-10-CM

## 2023-11-02 DIAGNOSIS — F5101 Primary insomnia: Secondary | ICD-10-CM

## 2023-11-07 DIAGNOSIS — M25511 Pain in right shoulder: Secondary | ICD-10-CM | POA: Diagnosis not present

## 2023-11-13 ENCOUNTER — Encounter (HOSPITAL_COMMUNITY): Payer: Self-pay | Admitting: Psychiatry

## 2023-11-13 ENCOUNTER — Telehealth (HOSPITAL_COMMUNITY): Admitting: Psychiatry

## 2023-11-13 DIAGNOSIS — F331 Major depressive disorder, recurrent, moderate: Secondary | ICD-10-CM | POA: Diagnosis not present

## 2023-11-13 DIAGNOSIS — F5101 Primary insomnia: Secondary | ICD-10-CM | POA: Diagnosis not present

## 2023-11-13 DIAGNOSIS — F411 Generalized anxiety disorder: Secondary | ICD-10-CM | POA: Diagnosis not present

## 2023-11-13 DIAGNOSIS — R251 Tremor, unspecified: Secondary | ICD-10-CM

## 2023-11-13 MED ORDER — ARIPIPRAZOLE 5 MG PO TABS: 5.0000 mg | ORAL_TABLET | Freq: Every day | ORAL | 0 refills | Status: DC

## 2023-11-13 MED ORDER — LAMOTRIGINE 150 MG PO TABS: 150.0000 mg | ORAL_TABLET | Freq: Two times a day (BID) | ORAL | 0 refills | Status: DC

## 2023-11-13 MED ORDER — LORAZEPAM 0.5 MG PO TABS: 0.5000 mg | ORAL_TABLET | Freq: Two times a day (BID) | ORAL | 2 refills | Status: DC

## 2023-11-13 MED ORDER — HYDROXYZINE HCL 10 MG PO TABS: 10.0000 mg | ORAL_TABLET | Freq: Two times a day (BID) | ORAL | 2 refills | Status: DC | PRN

## 2023-11-13 MED ORDER — VENLAFAXINE HCL ER 150 MG PO CP24: 150.0000 mg | ORAL_CAPSULE | Freq: Every day | ORAL | 0 refills | Status: DC
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
## 2023-11-19 DIAGNOSIS — Z299 Encounter for prophylactic measures, unspecified: Secondary | ICD-10-CM | POA: Diagnosis not present

## 2023-11-19 DIAGNOSIS — M5416 Radiculopathy, lumbar region: Secondary | ICD-10-CM | POA: Diagnosis not present

## 2023-11-19 DIAGNOSIS — I10 Essential (primary) hypertension: Secondary | ICD-10-CM | POA: Diagnosis not present

## 2023-12-10 DIAGNOSIS — R52 Pain, unspecified: Secondary | ICD-10-CM | POA: Diagnosis not present

## 2023-12-10 DIAGNOSIS — I10 Essential (primary) hypertension: Secondary | ICD-10-CM | POA: Diagnosis not present

## 2023-12-10 DIAGNOSIS — M25552 Pain in left hip: Secondary | ICD-10-CM | POA: Diagnosis not present

## 2023-12-10 DIAGNOSIS — R569 Unspecified convulsions: Secondary | ICD-10-CM | POA: Diagnosis not present

## 2023-12-10 DIAGNOSIS — Z299 Encounter for prophylactic measures, unspecified: Secondary | ICD-10-CM | POA: Diagnosis not present

## 2023-12-10 DIAGNOSIS — L0291 Cutaneous abscess, unspecified: Secondary | ICD-10-CM | POA: Diagnosis not present

## 2023-12-22 ENCOUNTER — Other Ambulatory Visit: Payer: Self-pay | Admitting: Obstetrics & Gynecology

## 2023-12-24 DIAGNOSIS — R52 Pain, unspecified: Secondary | ICD-10-CM | POA: Diagnosis not present

## 2023-12-24 DIAGNOSIS — F17210 Nicotine dependence, cigarettes, uncomplicated: Secondary | ICD-10-CM | POA: Diagnosis not present

## 2023-12-24 DIAGNOSIS — Z1389 Encounter for screening for other disorder: Secondary | ICD-10-CM | POA: Diagnosis not present

## 2023-12-24 DIAGNOSIS — Z0000 Encounter for general adult medical examination without abnormal findings: Secondary | ICD-10-CM | POA: Diagnosis not present

## 2023-12-24 DIAGNOSIS — I10 Essential (primary) hypertension: Secondary | ICD-10-CM | POA: Diagnosis not present

## 2023-12-24 DIAGNOSIS — Z299 Encounter for prophylactic measures, unspecified: Secondary | ICD-10-CM | POA: Diagnosis not present

## 2023-12-24 DIAGNOSIS — Z7189 Other specified counseling: Secondary | ICD-10-CM | POA: Diagnosis not present

## 2023-12-24 DIAGNOSIS — L02214 Cutaneous abscess of groin: Secondary | ICD-10-CM | POA: Diagnosis not present

## 2023-12-25 DIAGNOSIS — M545 Low back pain, unspecified: Secondary | ICD-10-CM | POA: Diagnosis not present

## 2024-01-02 DIAGNOSIS — R5383 Other fatigue: Secondary | ICD-10-CM | POA: Diagnosis not present

## 2024-01-02 DIAGNOSIS — I10 Essential (primary) hypertension: Secondary | ICD-10-CM | POA: Diagnosis not present

## 2024-01-02 DIAGNOSIS — Z299 Encounter for prophylactic measures, unspecified: Secondary | ICD-10-CM | POA: Diagnosis not present

## 2024-01-02 DIAGNOSIS — L02214 Cutaneous abscess of groin: Secondary | ICD-10-CM | POA: Diagnosis not present

## 2024-01-08 DIAGNOSIS — S42342K Displaced spiral fracture of shaft of humerus, left arm, subsequent encounter for fracture with nonunion: Secondary | ICD-10-CM | POA: Diagnosis not present

## 2024-01-08 DIAGNOSIS — R52 Pain, unspecified: Secondary | ICD-10-CM | POA: Diagnosis not present

## 2024-01-08 DIAGNOSIS — M79621 Pain in right upper arm: Secondary | ICD-10-CM | POA: Diagnosis not present

## 2024-01-16 DIAGNOSIS — S42342K Displaced spiral fracture of shaft of humerus, left arm, subsequent encounter for fracture with nonunion: Secondary | ICD-10-CM | POA: Diagnosis not present

## 2024-01-22 ENCOUNTER — Other Ambulatory Visit (HOSPITAL_COMMUNITY): Payer: Self-pay | Admitting: Psychiatry

## 2024-01-22 DIAGNOSIS — R251 Tremor, unspecified: Secondary | ICD-10-CM

## 2024-01-22 DIAGNOSIS — F5101 Primary insomnia: Secondary | ICD-10-CM

## 2024-02-04 DIAGNOSIS — Z7189 Other specified counseling: Secondary | ICD-10-CM | POA: Diagnosis not present

## 2024-02-04 DIAGNOSIS — S42341K Displaced spiral fracture of shaft of humerus, right arm, subsequent encounter for fracture with nonunion: Secondary | ICD-10-CM | POA: Diagnosis not present

## 2024-02-04 DIAGNOSIS — M8000XA Age-related osteoporosis with current pathological fracture, unspecified site, initial encounter for fracture: Secondary | ICD-10-CM | POA: Diagnosis not present

## 2024-02-05 DIAGNOSIS — R52 Pain, unspecified: Secondary | ICD-10-CM | POA: Diagnosis not present

## 2024-02-05 DIAGNOSIS — I10 Essential (primary) hypertension: Secondary | ICD-10-CM | POA: Diagnosis not present

## 2024-02-05 DIAGNOSIS — Z299 Encounter for prophylactic measures, unspecified: Secondary | ICD-10-CM | POA: Diagnosis not present

## 2024-02-05 DIAGNOSIS — G5712 Meralgia paresthetica, left lower limb: Secondary | ICD-10-CM | POA: Diagnosis not present

## 2024-02-05 DIAGNOSIS — M25551 Pain in right hip: Secondary | ICD-10-CM | POA: Diagnosis not present

## 2024-02-05 DIAGNOSIS — F17210 Nicotine dependence, cigarettes, uncomplicated: Secondary | ICD-10-CM | POA: Diagnosis not present

## 2024-02-05 DIAGNOSIS — M545 Low back pain, unspecified: Secondary | ICD-10-CM | POA: Diagnosis not present

## 2024-02-11 ENCOUNTER — Telehealth (HOSPITAL_COMMUNITY): Admitting: Psychiatry

## 2024-02-11 ENCOUNTER — Encounter (HOSPITAL_COMMUNITY): Payer: Self-pay | Admitting: Psychiatry

## 2024-02-11 VITALS — Wt 241.0 lb

## 2024-02-11 DIAGNOSIS — F331 Major depressive disorder, recurrent, moderate: Secondary | ICD-10-CM

## 2024-02-11 DIAGNOSIS — F4321 Adjustment disorder with depressed mood: Secondary | ICD-10-CM

## 2024-02-11 DIAGNOSIS — F5101 Primary insomnia: Secondary | ICD-10-CM | POA: Diagnosis not present

## 2024-02-11 DIAGNOSIS — F411 Generalized anxiety disorder: Secondary | ICD-10-CM

## 2024-02-11 DIAGNOSIS — R251 Tremor, unspecified: Secondary | ICD-10-CM

## 2024-02-11 MED ORDER — ARIPIPRAZOLE 5 MG PO TABS: 5.0000 mg | ORAL_TABLET | Freq: Every day | ORAL | 0 refills | Status: DC

## 2024-02-11 MED ORDER — LAMOTRIGINE 150 MG PO TABS: 150.0000 mg | ORAL_TABLET | Freq: Two times a day (BID) | ORAL | 0 refills | Status: DC

## 2024-02-11 MED ORDER — HYDROXYZINE HCL 10 MG PO TABS: 10.0000 mg | ORAL_TABLET | Freq: Two times a day (BID) | ORAL | 2 refills | Status: DC | PRN

## 2024-02-11 MED ORDER — VENLAFAXINE HCL ER 150 MG PO CP24: 150.0000 mg | ORAL_CAPSULE | Freq: Every day | ORAL | 0 refills | Status: DC

## 2024-02-11 MED ORDER — LORAZEPAM 0.5 MG PO TABS: 0.5000 mg | ORAL_TABLET | Freq: Two times a day (BID) | ORAL | 2 refills | Status: DC
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
## 2024-02-12 DIAGNOSIS — M545 Low back pain, unspecified: Secondary | ICD-10-CM | POA: Diagnosis not present

## 2024-02-12 DIAGNOSIS — M5126 Other intervertebral disc displacement, lumbar region: Secondary | ICD-10-CM | POA: Diagnosis not present

## 2024-02-12 DIAGNOSIS — M48061 Spinal stenosis, lumbar region without neurogenic claudication: Secondary | ICD-10-CM | POA: Diagnosis not present

## 2024-02-12 DIAGNOSIS — M51362 Other intervertebral disc degeneration, lumbar region with discogenic back pain and lower extremity pain: Secondary | ICD-10-CM | POA: Diagnosis not present

## 2024-02-13 DIAGNOSIS — S42342K Displaced spiral fracture of shaft of humerus, left arm, subsequent encounter for fracture with nonunion: Secondary | ICD-10-CM | POA: Diagnosis not present

## 2024-05-04 NOTE — Discharge Summary (Signed)
 "  Hospital Medicine Discharge Summary   Demographics: Hailey Owen 54 y.o. Dec 17, 1970 MRN: [id number]    Extended Emergency Contact Information Primary Emergency Contact: Moore,Barry Home Phone: [phone] Mobile Phone: [phone] Relation: Significant Other  Full Code  Admit Date: 04/25/2024                            Attending Physician: Erica Heddy Lia, MD Discharge Date: 05/04/2024  Primary Care Provider: PCP Needed PPI   None  Consults during this admission: Consult Orders             IP CONSULT TO HOSPITALIST       Provider:  (Not yet assigned)      IP CONSULT TO INFECTIOUS DISEASES       Specialty:  Infectious Diseases  Provider:  (Not yet assigned)             Active & Resolved Diagnosis: Principal Problem (Resolved):   E coli bacteremia Active Problems:   Osteomyelitis of right shoulder    (CMD)   UTI due to extended-spectrum beta lactamase (ESBL) producing Escherichia coli   Acute kidney injury   DM (diabetes mellitus), type 2 (CMD)   Generalized anxiety disorder   Seizure disorder    (CMD)   Acute on chronic respiratory failure with hypoxia    (CMD) Resolved Problems:   Acute hypoxic respiratory failure    (CMD)   Hypervolemia   Hypokalemia  Disposition: Patient discharged to Home with Home Health Care in stable condition.  Discharge follow-up recommendations : See Hospital Course  Scheduled Future Appointments       Provider Department Dept Phone Center   05/14/2024 10:30 AM Norleen Harriett [facility]  - Primary Care Family Sports Medicine [phone] [facility] [place]   05/14/2024 1:15 PM Reena Nat [facility]  - Orthopedic Sports Medicine [place] [phone] [facility] [place]      Hospital Course: Hailey Owen is a 54 year old woman with obesity, type 2 diabetes mellitus, tobacco use, overactive bladder, depression, anxiety, and a history of right humerus fracture with chronic  nonunion and prior osteomyelitis, who presented for elective orthopedic surgery and was found to be hypotensive and unwell, prompting admission for evaluation of sepsis.  On admission, she was found to have ESBL E. coli bacteremia and urinary tract infection, confirmed by blood and urine cultures, with CT abdomen/pelvis showing perinephric stranding and urinalysis supporting a urinary source. Infectious Disease was consulted and recommended continued IV meropenem, with blood cultures and urine cultures followed during the admission. She received a single dose each of IV vancomycin  and IV cefepime prior to transition to meropenem.  During hospitalization, she developed acute hypoxic respiratory failure with progressive oxygen requirements, requiring escalation to high-flow nasal cannula, BiPAP, and ICU transfer. Chest imaging revealed worsening pulmonary edema, and she was managed with aggressive IV diuresis, methylprednisolone , and respiratory support, with gradual improvement and eventual weaning to low-flow nasal cannula by discharge. Precedex was discontinued after resolution of agitation and anxiety. Echocardiogram showed mild concentric left ventricular hypertrophy, normal systolic function, and mild mitral regurgitation.  She also experienced acute kidney injury, likely multifactorial from sepsis and hypovolemia, which improved with supportive care and avoidance of nephrotoxic agents. Her chronic right humerus nonunion and prior osteomyelitis were managed non-operatively during admission, with orthopedic recommendations for non-weight bearing and sling for comfort, and surgery deferred until infection resolution and completion of  antibiotics.  Additional issues addressed included acute on chronic bilateral hip pain and left leg buckling, managed with conservative pain control and physical therapy, with imaging showing only degenerative changes. Constipation was treated and resolved early in the  admission. She was monitored for hypokalemia and hypervolemia during diuresis, with electrolyte replacement as needed. Her psychiatric comorbidities were managed with home medications, and seizure disorder was continued on home therapy.  By discharge, she was stable on 1-2 L nasal cannula, with oxygen saturations >92%, and had returned to her baseline functional status. She declined home health services, and durable medical equipment needs were addressed prior to discharge.  Assessment & Plan UTI due to extended-spectrum beta lactamase (ESBL) producing Escherichia coli Sepsis secondary to urinary tract infection with E. coli bacteremia Criteria for sepsis include acute renal failure, acute hypoxic respiratory failure requiring NIPPV.  Patient now weaned down gradually to room air.  Has had 10 days of IV meropenem for the ESBL E. coli bacteremia. Osteomyelitis of right shoulder    (CMD) S/p hardware removal of the right humerus fracture and ORIF. Patient had removal of hardware and placement of antibiotic spacer by Dr. Jenna.  Patient has an appointment to follow-up with Dr. Jenna at the end of this month to schedule surgery. Acute kidney injury Patient's BUN on admission was 47 and a creatinine of 1.06. Unsure what patient's baseline is.   DM (diabetes mellitus), type 2 (CMD)   - Acute COPD exacerbation/acute hypoxic respiratory failure Patient required high flow nasal cannula oxygen. Now weaned down to 2 L nasal cannula oxygen Patient then was treated with [facility] nebulization with albuterol ,formoterol ,budesonide , IV Solu-Medrol . Patient was able to be weaned off oxygen to room air.  And she felt good today.  She says she feels like she is back to her baseline.  She will need a nebulizer at home as well as inhalers like Advair which I will prescribe for her.  She will also continue a 5-day course of prednisone . Generalized anxiety disorder  Seizure disorder    (CMD)          Wound  / Incision Assessment: Refer to Chart Review and Media Tab for images if available.  Wound 03/03/24 Incision Shoulder Right (Active)  Date First Assessed/Time First Assessed: 03/03/24 1258   Pre-Existing Wound: No  Primary Wound Type: Incision  Location: Shoulder  Wound Location Orientation: Right  Wound Description (Comments): Surgical Incision:xeroform, 4x4, abd, ioban    Vital Sign Range:  Temp:  [97 F (36.1 C)-98.3 F (36.8 C)] 97.7 F (36.5 C) Heart Rate:  [61-72] 61 Resp:  [14-18] 14 BP: (110-150)/(40-56) 110/43      Discharge Medications     New Medications      Sig Disp Refill Start End  biotin 5 mg Tab tablet Commonly known as: MERIBIN  Take 5,000 mcg by mouth daily.   0     melatonin 3 mg tablet  Take 1 tablet (3 mg total) by mouth nightly as needed for sleep.  30 tablet  0     predniSONE  20 mg tablet Commonly known as: DELTASONE   Take 2 tablets (40 mg total) by mouth daily for 5 days.  10 tablet  0  May 05, 2024    senna 8.6 mg tablet Commonly known as: SENOKOT  Take 1 tablet (8.6 mg total) by mouth at bedtime.  90 tablet  0         Modified Medications      Sig Disp  Refill Start End  piroxicam 20 mg Cap Commonly known as: FELDENE What changed: reasons to take this  Take 1 capsule by mouth daily as needed.   0     valsartan-hydroCHLOROthiazide 160-12.5 mg per tablet Commonly known as: DIOVAN HCT What changed: These instructions start on May 11, 2024. If you are unsure what to do until then, ask your doctor or other care provider.  Take 1 tablet by mouth daily.  30 tablet  0  May 11, 2024        Medications To Continue      Sig Disp Refill Start End  ARIPiprazole  5 mg tablet Commonly known as: ABILIFY   Take 5 mg by mouth daily.   0     hydrOXYzine  10 mg tablet Commonly known as: ATARAX   Take 10 mg by mouth 2 (two) times a day.   0     lamoTRIgine  150 mg tablet Commonly known as: LaMICtal   Take 150 mg by mouth 2  (two) times a day.   0     levETIRAcetam 500 mg tablet Commonly known as: KEPPRA  Take 500 mg by mouth every evening.   0     pantoprazole  40 mg EC tablet Commonly known as: PROTONIX   Take 40 mg by mouth every evening.   0     rosuvastatin 20 mg tablet Commonly known as: CRESTOR  Take 20 mg by mouth nightly.   0     solifenacin  10 mg tablet Commonly known as: VESICARE   Take 10 mg by mouth nightly.   0     venlafaxine  150 mg 24 hr capsule Commonly known as: EFFEXOR  XR  Take 150 mg by mouth daily.   0         Stopped Medications    cholecalciferol 2,000 unit Cap capsule Commonly known as: VITAMIN D3   ibuprofen  200 mg tablet Commonly known as: MOTRIN    LORazepam  0.5 mg tablet Commonly known as: ATIVAN        Discharge Orders     Bedside Commode     Details:    Commode Type: 3-in-1   Height (in inches): 1.727 m (5' 8)   Weight (in lbs.): 105 kg (231 lb)   Length of need: Lifetime   Occupational Therapy Home Health Coordination     Details:    Actions: Evaluate and Treat   Physical Therapy Home Health Coordination     Details:    Actions: Evaluate and Treat   Walker     Details:    Height (in inches): 1.727 m (5' 8)   Weight (in lbs.): 105 kg (231 lb)   Walker Options: Rolling   Length of need: Lifetime         Lab Results  Component Value Date/Time   HGB 10.2 (L) 05/04/2024 05:04 AM   HCT 30.1 (L) 05/04/2024 05:04 AM   WBC 9.58 05/04/2024 05:04 AM   PLT 289 05/04/2024 05:04 AM   Lab Results  Component Value Date/Time   NA 139 05/02/2024 07:56 AM   K 3.4 05/02/2024 07:56 AM   CREATININE 1.06 05/02/2024 07:56 AM   BUN 47 (H) 05/02/2024 07:56 AM   GLUCOSE 106 (H) 05/02/2024 07:56 AM    Pertinent Imaging: CT Abdomen Pelvis WO Contrast  Final Result by Alm JONETTA Lovett, MD (01/05 9161)  CT ABDOMEN PELVIS WO CONTRAST, 04/25/2024 4:49 PM    INDICATION: abd pain, E Coli bacteremia   ADDITIONAL HISTORY: None.  COMPARISON: None available.  TECHNIQUE: CT images of the abdomen and pelvis were obtained without the   use of intravenous contrast. Conventional axial reconstructions and   multiplanar reformatted images were submitted for review.     LIMITATIONS: Evaluation of various soft tissue structures as well as the   vasculature is limited without the availability of intravenous contrast.   Parameters utilized in some study protocols to mitigate patient radiation   exposure can also reduce sensitivity and specificity of assessment.    FINDINGS:    . Lower Chest: Partially imaged coronary artery calcification. There are   some scattered patchy groundglass opacities in the lower lung zones   without frank airspace consolidation. Some of this appearance may be   augmented by patient respiratory motion artifact.    . Liver: Limited assessment for focal hepatic lesions in the absence of   contrast media. No overt contour defining abnormality is otherwise   visible. Hepatomegaly (24.3 cm)  . Gallbladder/Biliary: Unremarkable.  SABRA Spleen: Spleen is mildly enlarged, 13.7 cm craniocaudal.  . Pancreas: No surrounding inflammation or fluid identified.  . Adrenals: Unremarkable.  . Kidneys: Nonspecific and otherwise symmetric perirenal interstitial   septal thickening. Limited assessment for focal urothelial or renal   parenchymal abnormalities in the absence of contrast media. No visible   obstructive uropathy or urolithiasis otherwise.    SABRA Peritoneum/Mesenteries/Extraperitoneum: Negative for ascites or   loculated intraperitoneal collection. Scattered subcentimeter   retroperitoneal and lesser omental lymph nodes, nonspecific..   Gastrointestinal tract: No evidence of obstruction. Normal caliber   appendix.    SABRA Ureters: Unremarkable.  . Bladder: No stones are seen within the lumen.  . Reproductive System: Status post hysterectomy. No adnexal abnormalities   are appreciated.    . Vascular: Limited assessment without  contrast media. Extensive calcific   plaque burden throughout a nonaneurysmal abdominal aorta and visualized   iliofemoral arterial tree. Likely multifocal areas of stenoses given   extensive calcific plaque burden at multiple sites.  . Musculoskeletal: Scattered degenerative changes in the visualized spine.   Polyarticular degenerative changes.  Abdominal wall soft tissues   unremarkable.    IMPRESSION:    1. Symmetric perinephric stranding is nonspecific. It would be difficult   to entirely exclude upper tract infection; advise correlation with patient   symptoms/presentation.  2. Partially imaged patchy groundglass changes in the bilateral lower   lobes and right middle lobe. Some of this appearance may be augmented by   patient respiratory motion artifact, though it would be difficult to   exclude infection.  3. Hepatosplenomegaly.  4. Extensive calcific atherosclerotic plaque throughout the abdominal   aorta and iliofemoral arterial tree. Arterial assessment is otherwise   limited without contrast media.  5. Additional ancillary findings as noted above.    XR Hips Bilateral 2 Vw With Or Without Pelvis  Final Result by Roena Hy, MD (01/05 1211)  XR HIPS BILATERAL 2 VW WITH OR WITHOUT PELVIS, 04/25/2024 3:04 PM     INDICATION: acute on chronic bilateral hip pain, bacteremia  COMPARISON: None.    IMPRESSION:  1.  No acute fracture.   2.  No dislocation.  3.  Mild bilateral hip and incompletely imaged lower lumbar spine   degenerative changes.      XR Chest 1 View  Final Result by Lawton Cinderella Fila, MD (01/03 1153)  Date of Service: 2024-04-25 10:53:00    EXAM:  XR Chest, 1  View.    CLINICAL HISTORY:  fever.  COMPARISON:  None provided.      FINDINGS:    LUNGS AND PLEURA:  The lungs are clear.   No pleural effusion or pneumothorax.    HEART AND MEDIASTINUM:  The heart size and mediastinal contours are normal.    BONES:  No acute osseous abnormality.     IMPRESSION:    No acute cardiopulmonary pathology.    Electronically signed by: Lawton Fila MD on 04/25/2024 11:53:49 AM   US [place]      Predictive Model Details        26.1% (Medium)  Factor Value   Calculated 05/04/2024 08:05 15% Number of appointments in last 90 days 14   Readmission Risk Score v2 Model 9% Latest BUN in last 72 hrs 47 mg/dL    7% Braden score 21    6% Latest hemoglobin in last 72 hrs 10.2 g/dL    5% Number of hospitalizations in last year 1     Electronically signed by: Erica MALVA Lia, MD 05/04/2024 9:30 AM   Time spent on discharge: 34 minutes  "

## 2024-05-06 ENCOUNTER — Other Ambulatory Visit (HOSPITAL_COMMUNITY): Payer: Self-pay | Admitting: Psychiatry

## 2024-05-06 DIAGNOSIS — F5101 Primary insomnia: Secondary | ICD-10-CM

## 2024-05-06 DIAGNOSIS — R251 Tremor, unspecified: Secondary | ICD-10-CM

## 2024-05-11 ENCOUNTER — Telehealth (HOSPITAL_COMMUNITY): Admitting: Psychiatry

## 2024-05-11 ENCOUNTER — Encounter (HOSPITAL_COMMUNITY): Payer: Self-pay | Admitting: Psychiatry

## 2024-05-11 VITALS — Wt 213.0 lb

## 2024-05-11 DIAGNOSIS — R251 Tremor, unspecified: Secondary | ICD-10-CM | POA: Diagnosis not present

## 2024-05-11 DIAGNOSIS — F331 Major depressive disorder, recurrent, moderate: Secondary | ICD-10-CM | POA: Diagnosis not present

## 2024-05-11 DIAGNOSIS — F411 Generalized anxiety disorder: Secondary | ICD-10-CM

## 2024-05-11 DIAGNOSIS — F5101 Primary insomnia: Secondary | ICD-10-CM | POA: Diagnosis not present

## 2024-05-11 MED ORDER — VENLAFAXINE HCL ER 150 MG PO CP24: 150.0000 mg | ORAL_CAPSULE | Freq: Every day | ORAL | 0 refills | Status: AC

## 2024-05-11 MED ORDER — LORAZEPAM 0.5 MG PO TABS: 0.5000 mg | ORAL_TABLET | Freq: Two times a day (BID) | ORAL | 2 refills | Status: AC

## 2024-05-11 MED ORDER — LAMOTRIGINE 150 MG PO TABS: 150.0000 mg | ORAL_TABLET | Freq: Two times a day (BID) | ORAL | 0 refills | Status: AC

## 2024-05-11 MED ORDER — HYDROXYZINE HCL 10 MG PO TABS: 10.0000 mg | ORAL_TABLET | Freq: Two times a day (BID) | ORAL | 2 refills | Status: AC | PRN

## 2024-05-11 MED ORDER — ARIPIPRAZOLE 5 MG PO TABS: 5.0000 mg | ORAL_TABLET | Freq: Every day | ORAL | 0 refills | Status: AC
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
## 2024-08-10 ENCOUNTER — Telehealth (HOSPITAL_COMMUNITY): Admitting: Psychiatry
